# Patient Record
Sex: Female | Born: 1980 | Race: Black or African American | Hispanic: No | Marital: Single | Smoking: Former smoker
Health system: Southern US, Community
[De-identification: ages and names within clinical notes are randomized; demographics above are authoritative.]

## PROBLEM LIST (undated history)

## (undated) ENCOUNTER — Inpatient Hospital Stay (HOSPITAL_COMMUNITY): Payer: Self-pay

## (undated) DIAGNOSIS — M19072 Primary osteoarthritis, left ankle and foot: Secondary | ICD-10-CM

## (undated) DIAGNOSIS — Z6841 Body Mass Index (BMI) 40.0 and over, adult: Secondary | ICD-10-CM

## (undated) DIAGNOSIS — J302 Other seasonal allergic rhinitis: Secondary | ICD-10-CM

## (undated) DIAGNOSIS — I10 Essential (primary) hypertension: Secondary | ICD-10-CM

## (undated) DIAGNOSIS — A5901 Trichomonal vulvovaginitis: Secondary | ICD-10-CM

## (undated) DIAGNOSIS — E559 Vitamin D deficiency, unspecified: Secondary | ICD-10-CM

## (undated) DIAGNOSIS — D563 Thalassemia minor: Secondary | ICD-10-CM

## (undated) HISTORY — DX: Trichomonal vulvovaginitis: A59.01

## (undated) HISTORY — DX: Primary osteoarthritis, left ankle and foot: M19.072

## (undated) HISTORY — DX: Other seasonal allergic rhinitis: J30.2

## (undated) HISTORY — DX: Vitamin D deficiency, unspecified: E55.9

---

## 2002-05-28 ENCOUNTER — Encounter: Payer: Self-pay | Admitting: Family Medicine

## 2002-05-28 ENCOUNTER — Ambulatory Visit (HOSPITAL_COMMUNITY): Admission: RE | Admit: 2002-05-28 | Discharge: 2002-05-28 | Payer: Self-pay | Admitting: Family Medicine

## 2003-05-28 ENCOUNTER — Ambulatory Visit (HOSPITAL_COMMUNITY): Admission: RE | Admit: 2003-05-28 | Discharge: 2003-05-28 | Payer: Self-pay | Admitting: Family Medicine

## 2004-04-28 ENCOUNTER — Ambulatory Visit: Payer: Self-pay | Admitting: Family Medicine

## 2004-04-29 ENCOUNTER — Ambulatory Visit: Payer: Self-pay | Admitting: *Deleted

## 2004-05-12 ENCOUNTER — Emergency Department (HOSPITAL_COMMUNITY): Admission: EM | Admit: 2004-05-12 | Discharge: 2004-05-12 | Payer: Self-pay | Admitting: *Deleted

## 2004-06-29 ENCOUNTER — Ambulatory Visit: Payer: Self-pay | Admitting: Family Medicine

## 2004-07-20 ENCOUNTER — Ambulatory Visit: Payer: Self-pay | Admitting: Family Medicine

## 2004-10-20 ENCOUNTER — Ambulatory Visit: Payer: Self-pay | Admitting: Family Medicine

## 2005-01-14 ENCOUNTER — Ambulatory Visit: Payer: Self-pay | Admitting: Family Medicine

## 2005-06-01 ENCOUNTER — Ambulatory Visit: Payer: Self-pay | Admitting: Family Medicine

## 2005-06-15 ENCOUNTER — Ambulatory Visit: Payer: Self-pay | Admitting: Family Medicine

## 2005-06-15 ENCOUNTER — Other Ambulatory Visit: Admission: RE | Admit: 2005-06-15 | Discharge: 2005-06-15 | Payer: Self-pay | Admitting: Family Medicine

## 2005-10-07 ENCOUNTER — Ambulatory Visit: Payer: Self-pay | Admitting: Family Medicine

## 2007-12-16 ENCOUNTER — Emergency Department (HOSPITAL_COMMUNITY): Admission: EM | Admit: 2007-12-16 | Discharge: 2007-12-16 | Payer: Self-pay | Admitting: Emergency Medicine

## 2008-10-04 ENCOUNTER — Emergency Department (HOSPITAL_COMMUNITY): Admission: EM | Admit: 2008-10-04 | Discharge: 2008-10-04 | Payer: Self-pay | Admitting: Emergency Medicine

## 2009-01-23 ENCOUNTER — Emergency Department (HOSPITAL_COMMUNITY): Admission: EM | Admit: 2009-01-23 | Discharge: 2009-01-23 | Payer: Self-pay | Admitting: Emergency Medicine

## 2010-02-10 ENCOUNTER — Emergency Department (HOSPITAL_COMMUNITY): Admission: EM | Admit: 2010-02-10 | Discharge: 2010-02-10 | Payer: Self-pay | Admitting: Family Medicine

## 2010-07-31 ENCOUNTER — Emergency Department (HOSPITAL_COMMUNITY)
Admission: EM | Admit: 2010-07-31 | Discharge: 2010-07-31 | Payer: Self-pay | Source: Home / Self Care | Admitting: Family Medicine

## 2010-11-30 LAB — POCT RAPID STREP A (OFFICE): Streptococcus, Group A Screen (Direct): NEGATIVE

## 2011-02-04 ENCOUNTER — Inpatient Hospital Stay (INDEPENDENT_AMBULATORY_CARE_PROVIDER_SITE_OTHER)
Admission: RE | Admit: 2011-02-04 | Discharge: 2011-02-04 | Disposition: A | Discharge status: Home / Self Care | Payer: Self-pay | Source: Ambulatory Visit | Attending: Emergency Medicine | Admitting: Emergency Medicine

## 2011-02-04 DIAGNOSIS — J019 Acute sinusitis, unspecified: Secondary | ICD-10-CM

## 2011-05-18 LAB — POCT URINALYSIS DIP (DEVICE)
Glucose, UA: NEGATIVE
Protein, ur: >=300 — AB
Specific Gravity, Urine: 1.025
Urobilinogen, UA: 0.2
pH: 7

## 2011-09-14 ENCOUNTER — Encounter (HOSPITAL_COMMUNITY): Payer: Self-pay | Admitting: *Deleted

## 2011-09-14 ENCOUNTER — Emergency Department (INDEPENDENT_AMBULATORY_CARE_PROVIDER_SITE_OTHER)
Admission: EM | Admit: 2011-09-14 | Discharge: 2011-09-14 | Disposition: A | Discharge status: Home / Self Care | Payer: Self-pay | Source: Home / Self Care | Attending: Emergency Medicine | Admitting: Emergency Medicine

## 2011-09-14 DIAGNOSIS — J40 Bronchitis, not specified as acute or chronic: Secondary | ICD-10-CM

## 2011-09-14 MED ORDER — AZITHROMYCIN 250 MG PO TABS: 250.0000 mg | ORAL_TABLET | Freq: Every day | ORAL | Status: AC

## 2011-09-14 MED ORDER — ACETAMINOPHEN-CODEINE #3 300-30 MG PO TABS: 1.0000 | ORAL_TABLET | Freq: Four times a day (QID) | ORAL | Status: AC | PRN

## 2011-09-14 NOTE — ED Notes (Signed)
8 days of cold sxs, she feels  worse today with a productive cough, sinus congestion, left ear pain.   Denies fever/chills

## 2011-09-14 NOTE — ED Provider Notes (Signed)
History     CSN: [id number]  Arrival date & time 09/14/11  1654   First MD Initiated Contact with Patient 09/14/11 1705      Chief Complaint  Patient presents with  . URI    (Consider location/radiation/quality/duration/timing/severity/associated sxs/prior treatment) Patient is a 31 y.o. female presenting with cough. The history is provided by the patient.  Cough This is a new problem. The current episode started more than 1 week ago. The problem occurs constantly. The problem has not changed since onset.The cough is productive of sputum. There has been no fever. Associated symptoms include ear pain. Pertinent negatives include no chills, no sweats, no ear congestion, no headaches, no rhinorrhea, no sore throat, no myalgias, no shortness of breath, no wheezing and no eye redness. She has tried nothing for the symptoms. She is not a smoker. Her past medical history does not include asthma.    Past Medical History  Diagnosis Date  . Obesity     History reviewed. No pertinent past surgical history.  Family History  Problem Relation Age of Onset  . Hypertension Sister   . Hypertension Brother     History  Substance Use Topics  . Smoking status: Former Smoker    Types: Cigarettes    Quit date: 09/01/2009  . Smokeless tobacco: Not on file  . Alcohol Use: No    OB History    Grav Para Term Preterm Abortions TAB SAB Ect Mult Living                  Review of Systems  Constitutional: Negative for chills.  HENT: Positive for ear pain. Negative for sore throat and rhinorrhea.   Eyes: Negative for redness.  Respiratory: Positive for cough. Negative for shortness of breath and wheezing.   Musculoskeletal: Negative for myalgias.  Neurological: Negative for headaches.    Allergies  Penicillins  Home Medications   Current Outpatient Rx  Name Route Sig Dispense Refill  . ACETAMINOPHEN-CODEINE #3 300-30 MG PO TABS Oral Take 1-2 tablets by mouth every 6 (six) hours as  needed for pain. 15 tablet 0  . AZITHROMYCIN 250 MG PO TABS Oral Take 1 tablet (250 mg total) by mouth daily. Take first 2 tablets together, then 1 every day until finished. 6 tablet 0    BP 130/85  Pulse 64  Temp(Src) 98.6 F (37 C) (Oral)  Resp 20  SpO2 100%  LMP 09/12/2011  Physical Exam  Nursing note and vitals reviewed. Constitutional: She appears well-developed and well-nourished. She appears lethargic. No distress.  HENT:  Mouth/Throat: No oropharyngeal exudate.  Eyes: Conjunctivae are normal. Right eye exhibits no discharge. Left eye exhibits no discharge.  Neck: Neck supple. No JVD present.  Cardiovascular: Normal rate.  Exam reveals no gallop and no friction rub.   No murmur heard. Pulmonary/Chest: Effort normal. No respiratory distress. She has no decreased breath sounds. She has no wheezes. She has no rhonchi. She has no rales.  Abdominal: Soft. She exhibits no distension.  Musculoskeletal: Normal range of motion.  Lymphadenopathy:    She has no cervical adenopathy.  Neurological: She appears lethargic. No cranial nerve deficit or sensory deficit. She exhibits normal muscle tone.  Skin: Skin is warm. No erythema.    ED Course  Procedures (including critical care time)  Labs Reviewed - No data to display No results found.   1. Bronchitis       MDM  Productive cough for 1 week- Afebrile with Left side otalagia  Jimmie Molly, MD 09/14/11 [phone]

## 2011-12-16 ENCOUNTER — Encounter (HOSPITAL_COMMUNITY): Payer: Self-pay | Admitting: Emergency Medicine

## 2011-12-16 ENCOUNTER — Emergency Department (HOSPITAL_COMMUNITY)
Admission: EM | Admit: 2011-12-16 | Discharge: 2011-12-16 | Disposition: A | Discharge status: Home / Self Care | Payer: Self-pay | Source: Home / Self Care | Attending: Emergency Medicine | Admitting: Emergency Medicine

## 2011-12-16 DIAGNOSIS — J309 Allergic rhinitis, unspecified: Secondary | ICD-10-CM

## 2011-12-16 DIAGNOSIS — M7580 Other shoulder lesions, unspecified shoulder: Secondary | ICD-10-CM

## 2011-12-16 DIAGNOSIS — R030 Elevated blood-pressure reading, without diagnosis of hypertension: Secondary | ICD-10-CM

## 2011-12-16 DIAGNOSIS — J019 Acute sinusitis, unspecified: Secondary | ICD-10-CM

## 2011-12-16 DIAGNOSIS — M719 Bursopathy, unspecified: Secondary | ICD-10-CM

## 2011-12-16 MED ORDER — FEXOFENADINE-PSEUDOEPHED ER 60-120 MG PO TB12: 1.0000 | ORAL_TABLET | Freq: Two times a day (BID) | ORAL | Status: AC

## 2011-12-16 MED ORDER — BENZONATATE 200 MG PO CAPS: 200.0000 mg | ORAL_CAPSULE | Freq: Three times a day (TID) | ORAL | Status: AC | PRN

## 2011-12-16 MED ORDER — AZITHROMYCIN 250 MG PO TABS: ORAL_TABLET | ORAL | Status: AC

## 2011-12-16 MED ORDER — METHOCARBAMOL 500 MG PO TABS: 500.0000 mg | ORAL_TABLET | Freq: Three times a day (TID) | ORAL | Status: AC

## 2011-12-16 MED ORDER — FLUTICASONE PROPIONATE 50 MCG/ACT NA SUSP: 2.0000 | Freq: Every day | NASAL | Status: DC

## 2011-12-16 NOTE — Discharge Instructions (Signed)
Do exercises as demonstrated twice daily followed by ice.  Blood pressure over the ideal can put you at higher risk for stroke, heart disease, and kidney failure.  For this reason, it's important to try to get your blood pressure as close as possible to the ideal.  The ideal blood pressure is 120/80.  Blood pressures from 120-139 systolic over 80-89 diastolic are labeled as "prehypertension."  This means you are at higher risk of developing hypertension in the future.  Blood pressures in this range are not treated with medication, but lifestyle changes are recommended to prevent progression to hypertension.  Blood pressures of 140 and above systolic over 90 and above diastolic are classified as hypertension and are treated with medications.  Lifestyle changes which can benefit both prehypertension and hypertension include the following:   Salt and sodium restriction.  Weight loss.  Regular exercise.  Avoidance of tobacco.  Avoidance of excess alcohol.  The "D.A.S.H" diet.   People with hypertension and prehypertension should limit their salt intake to less than 1500 mg daily.  Reading the nutrition information on the label of many prepared foods can give you an idea of how much sodium you're consuming at each meal.  Remember that the most important number on the nutrition information is the serving size.  It may be smaller than you think.  Try to avoid adding extra salt at the table.  You may add small amounts of salt while cooking.  Remember that salt is an acquired taste and you may get used to a using a whole lot less salt than you are using now.  Using less salt lets the food's natural flavors come through.  You might want to consider using salt substitutes, potassium chloride, pepper, or blends of herbs and spices to enhance the flavor of your food.  Foods that contain the most salt include: processed meats (like ham, bacon, lunch meat, sausage, hot dogs, and breakfast meat), chips,  pretzels, salted nuts, soups, salty snacks, canned foods, junk food, fast food, restaurant food, mustard, pickles, pizza, popcorn, soy sauce, and worcestershire sauce--quite a list!  You might ask, "Is there anything I can eat?"  The answer is, "yes."  Fruits and vegetables are usually low in salt.  Fresh is better than frozen which is better than canned.  If you have canned vegetables, you can cut down on the salt content by rinsing them in tap water 3 times before cooking.     Weight loss is the second thing you can do to lower your blood pressure.  Getting to and maintaining ideal weight will often normalize your blood pressure and allow you to avoid medications, entirely, cut way down on your dosage of medications, or allow to wean off your meds.  (Note, this should only be done under the supervision of your primary care doctor.)  Of course, weight loss takes time and you may need to be on medication in the meantime.  You shoot for a body mass index of 20-25.  When you go to the urgent care or to your primary care doctor, they should calculate your BMI.  If you don't know what it is, ask.  You can calculate your BMI with the following formula:  Weight in pounds x 703/ (height in inches) x (height in inches).  There are many good diets out there: Weight Watchers and the D.A.S.H. Diet are the best, but often, just modifying a few factors can be helpful:  Don't skip meals, don't eat out,  and keeping a food diary.  I do not recommend fad diets or diet pills which often raise blood pressure.    Everyone should get regular exercise, but this is particularly important for people with high blood pressure.  Just about any exercise is good.  The only exercise which may be harmful is lifting extreme heavy weights.  I recommend moderate exercise such as walking for 30 minutes 5 days a week.  Going to the gym for a 50 minute workout 3 times a week is also good.  This amounts to 150 minutes of exercise  weekly.   Anyone with high blood pressure should avoid any use of tobacco.  Tobacco use does not elevate blood pressure, but it increases the risk of heart disease and stroke.  If you are interested in quitting, discuss with your doctor how to quit.  If you are not interested in quitting, ask yourself, "What would my life be like in 10 years if I continue to smoke?"  "How will I know when it is time to quit?"  "How would my life be better if I were to quit."   Excess alcohol intake can raise the blood pressure.  The safe alcohol intake is 2 drinks or less per day for men and 1 drink per day or less for women.   There is a very good diet which I recommend that has been designed for people with blood pressure called the D.A.S.H. Diet (dietary approaches to stop hypertension).  It consists of fruits, vegetables, lean meats, low fat dairy, whole grains, nuts and seeds.  It is very low in salt and sodium.  It has also been found to have other beneficial health effects such as lowering cholesterol and helping lose weight.  It has been developed by the [employer] and can be downloaded from the internet without any cost. Just do a Programmer, multimedia on "D.A.S.H. Diet." or go the NIH website (GolfingPosters.tn).  There are also cookbooks and diet plans that can be gotten from [place] to help you with this diet.   Allergic Rhinitis Allergic rhinitis is when the mucous membranes in the nose respond to allergens. Allergens are particles in the air that cause your body to have an allergic reaction. This causes you to release allergic antibodies. Through a chain of events, these eventually cause you to release histamine into the blood stream (hence the use of antihistamines). Although meant to be protective to the body, it is this release that causes your discomfort, such as frequent sneezing, congestion and an itchy runny nose.  CAUSES  The pollen allergens may come from grasses, trees, and weeds. This is  seasonal allergic rhinitis, or "hay fever." Other allergens cause year-round allergic rhinitis (perennial allergic rhinitis) such as house dust mite allergen, pet dander and mold spores.  SYMPTOMS   Nasal stuffiness (congestion).   Runny, itchy nose with sneezing and tearing of the eyes.   There is often an itching of the mouth, eyes and ears.  It cannot be cured, but it can be controlled with medications. DIAGNOSIS  If you are unable to determine the offending allergen, skin or blood testing may find it. TREATMENT   Avoid the allergen.   Medications and allergy shots (immunotherapy) can help.   Hay fever may often be treated with antihistamines in pill or nasal spray forms. Antihistamines block the effects of histamine. There are over-the-counter medicines that may help with nasal congestion and swelling around the eyes. Check with your  caregiver before taking or giving this medicine.  If the treatment above does not work, there are many new medications your caregiver can prescribe. Stronger medications may be used if initial measures are ineffective. Desensitizing injections can be used if medications and avoidance fails. Desensitization is when a patient is given ongoing shots until the body becomes less sensitive to the allergen. Make sure you follow up with your caregiver if problems continue. SEEK MEDICAL CARE IF:   You develop fever (more than 100.5 F (38.1 C).   You develop a cough that does not stop easily (persistent).   You have shortness of breath.   You start wheezing.   Symptoms interfere with normal daily activities.  Document Released: 05/04/2001 Document Revised: 07/29/2011 Document Reviewed: 11/13/2008 [facility] Patient Information 2012 [place], [place].Sinusitis Sinuses are air pockets within the bones of your face. The growth of bacteria within a sinus leads to infection. The infection prevents the sinuses from draining. This infection is called  sinusitis. SYMPTOMS  There will be different areas of pain depending on which sinuses have become infected.  The maxillary sinuses often produce pain beneath the eyes.   Frontal sinusitis may cause pain in the middle of the forehead and above the eyes.  Other problems (symptoms) include:  Toothaches.   Colored, pus-like (purulent) drainage from the nose.   Swelling, warmth, and tenderness over the sinus areas may be signs of infection.  TREATMENT  Sinusitis is most often determined by an exam.X-rays may be taken. If x-rays have been taken, make sure you obtain your results or find out how you are to obtain them. Your caregiver may give you medications (antibiotics). These are medications that will help kill the bacteria causing the infection. You may also be given a medication (decongestant) that helps to reduce sinus swelling.  HOME CARE INSTRUCTIONS   Only take over-the-counter or prescription medicines for pain, discomfort, or fever as directed by your caregiver.   Drink extra fluids. Fluids help thin the mucus so your sinuses can drain more easily.   Applying either moist heat or ice packs to the sinus areas may help relieve discomfort.   Use saline nasal sprays to help moisten your sinuses. The sprays can be found at your local drugstore.  SEEK IMMEDIATE MEDICAL CARE IF:  You have a fever.   You have increasing pain, severe headaches, or toothache.   You have nausea, vomiting, or drowsiness.   You develop unusual swelling around the face or trouble seeing.  MAKE SURE YOU:   Understand these instructions.   Will watch your condition.   Will get help right away if you are not doing well or get worse.  Document Released: 08/09/2005 Document Revised: 07/29/2011 Document Reviewed: 03/08/2007 [facility] Patient Information 2012 [place], [place].  Get blood pressure checked in 2 weeks.

## 2011-12-16 NOTE — ED Notes (Signed)
Pt here with sinus sx post nasal drip,sore throat that started Monday unrelieved by otc meds and also right neck/shoulder pain from poss strain.

## 2011-12-16 NOTE — ED Provider Notes (Signed)
Chief Complaint  Patient presents with  . Sinusitis  . Shoulder Pain    History of Present Illness:   The patient is a 31 year old female who has had a several year long history of allergies. They have flared up for the past month. She notes nasal congestion, rhinorrhea with yellow drainage, sneezing, nasal itching, headache, and facial pain. She also has postnasal drip, sore throat, hoarseness, and cough productive of yellow sputum. She tried Benadryl which did help a little bit. She denies any itchy, watery, red eyes.  She also has had a four-day history of pain in her upper back overlying the right shoulder blade. This is worsened if she moves her shoulder or takes a deep breath. She attributes this to coughing hard. There is no radiation of the pain down the arm, no numbness, tingling, or weakness. No shortness of breath or hemoptysis.  Review of Systems:  Other than noted above, the patient denies any of the following symptoms. Systemic:  No fever, chills, sweats, fatigue, myalgias, headache, or anorexia. Eye:  No redness, itching, watering, pain or drainage. ENT:  No earache, ear congestion, nasal congestion, sneezing, nasal itching, rhinorrhea, sinus pressure, sinus pain, post nasal drip, or sore throat. Lungs:  No cough, sputum production, wheezing, shortness of breath, or chest pain. Skin:  No rash or itching.  PMFSH:  Past medical history, family history, social history, meds, and allergies were reviewed.  No history of asthma. No use of tobacco.  Physical Exam:   Vital signs:  BP 141/102  Pulse 72  Temp(Src) 98.4 F (36.9 C) (Oral)  Resp 18  SpO2 100%  LMP 12/03/2011 General:  Alert, in no distress. Eye:  No conjunctival injection or drainage. Lids were normal. ENT:  TMs and canals were normal, without erythema or inflammation.  Nasal mucosa was congested, pale and boggy with clear drainage.  Mucous membranes were moist.  Pharynx was clear, without exudate or drainage.  There  were no oral ulcerations or lesions. Neck:  Supple, no adenopathy, tenderness or mass. Lungs:  No respiratory distress.  Lungs were clear to auscultation, without wheezes, rales or rhonchi.  Breath sounds were clear and equal bilaterally. Heart:  Regular rhythm, without gallops, murmers or rubs. Extremities: There is pain to palpation over the right shoulder blade and medial to the shoulder blade as well. The shoulder has a full range of motion but with some pain on flexion and abduction. Impingement signs are positive. Skin:  Clear, warm, and dry, without rash or lesions.  Assessment:  The primary encounter diagnosis was Allergic rhinitis. Diagnoses of Acute sinusitis, Rotator cuff tendonitis, and Elevated blood pressure were also pertinent to this visit.  Plan:   1.  The following meds were prescribed:   New Prescriptions   AZITHROMYCIN (ZITHROMAX Z-PAK) 250 MG TABLET    Take as directed.   BENZONATATE (TESSALON) 200 MG CAPSULE    Take 1 capsule (200 mg total) by mouth 3 (three) times daily as needed for cough.   FEXOFENADINE-PSEUDOEPHEDRINE (ALLEGRA-D) 60-120 MG PER TABLET    Take 1 tablet by mouth every 12 (twelve) hours.   FLUTICASONE (FLONASE) 50 MCG/ACT NASAL SPRAY    Place 2 sprays into the nose daily.   METHOCARBAMOL (ROBAXIN) 500 MG TABLET    Take 1 tablet (500 mg total) by mouth 3 (three) times daily.   2.  The patient was instructed in symptomatic care and handouts were given. 3.  The patient was told to return if becoming worse in  any way, if no better in 3 or 4 days, and given some red flag symptoms that would indicate earlier return.  Follow up:  The patient was told to follow up with her primary care physician if no better in a week. She was instructed in some shoulder exercises and should followup if the shoulder isn't better as well. She was also told to followup about her blood pressure which was elevated today.    Reuben Likes, MD 12/16/11 2139

## 2012-06-29 ENCOUNTER — Encounter (HOSPITAL_COMMUNITY): Payer: Self-pay | Admitting: *Deleted

## 2012-06-29 ENCOUNTER — Emergency Department (INDEPENDENT_AMBULATORY_CARE_PROVIDER_SITE_OTHER)
Admission: EM | Admit: 2012-06-29 | Discharge: 2012-06-29 | Disposition: A | Discharge status: Home / Self Care | Payer: Self-pay | Source: Home / Self Care | Attending: Family Medicine | Admitting: Family Medicine

## 2012-06-29 DIAGNOSIS — J069 Acute upper respiratory infection, unspecified: Secondary | ICD-10-CM

## 2012-06-29 MED ORDER — GUAIFENESIN-CODEINE 100-10 MG/5ML PO SYRP: 10.0000 mL | ORAL_SOLUTION | Freq: Three times a day (TID) | ORAL | Status: DC | PRN

## 2012-06-29 MED ORDER — AZITHROMYCIN 250 MG PO TABS: ORAL_TABLET | ORAL | Status: DC

## 2012-06-29 MED ORDER — IPRATROPIUM BROMIDE 0.06 % NA SOLN: 2.0000 | Freq: Four times a day (QID) | NASAL | Status: DC

## 2012-06-29 NOTE — ED Notes (Signed)
C/o cough onset Sun.  occ. prod. of yellowish sputum.  No chills or fever, or runny nose.  Had a little pain in her L ear yesterday.  Also c/o ant and post. chest pain with a deep breath. "Feels like someone is sitting on my back."

## 2012-06-29 NOTE — ED Provider Notes (Signed)
History     CSN: [id number]  Arrival date & time 06/29/12  1646   First MD Initiated Contact with Patient 06/29/12 1702      Chief Complaint  Patient presents with  . Cough    (Consider location/radiation/quality/duration/timing/severity/associated sxs/prior treatment) Patient is a 31 y.o. female presenting with cough. The history is provided by the patient.  Cough This is a new problem. The current episode started more than 2 days ago. The problem has been gradually worsening. The cough is productive of sputum. There has been no fever. Associated symptoms include chest pain. Pertinent negatives include no rhinorrhea, no shortness of breath and no wheezing.    Past Medical History  Diagnosis Date  . Obesity     History reviewed. No pertinent past surgical history.  Family History  Problem Relation Age of Onset  . Hypertension Sister   . Hypertension Brother     History  Substance Use Topics  . Smoking status: Former Smoker    Types: Cigarettes    Quit date: 09/01/2009  . Smokeless tobacco: Not on file  . Alcohol Use: No    OB History    Grav Para Term Preterm Abortions TAB SAB Ect Mult Living                  Review of Systems  Constitutional: Negative.   HENT: Negative for congestion, rhinorrhea and postnasal drip.   Respiratory: Positive for cough. Negative for shortness of breath and wheezing.   Cardiovascular: Positive for chest pain.  Gastrointestinal: Negative.     Allergies  Penicillins  Home Medications   Current Outpatient Rx  Name  Route  Sig  Dispense  Refill  . AZITHROMYCIN 250 MG PO TABS      Take as directed on pack   6 each   0   . FEXOFENADINE-PSEUDOEPHED ER 60-120 MG PO TB12   Oral   Take 1 tablet by mouth every 12 (twelve) hours.   30 tablet   0   . FLUTICASONE PROPIONATE 50 MCG/ACT NA SUSP   Nasal   Place 2 sprays into the nose daily.   16 g   0   . GUAIFENESIN-CODEINE 100-10 MG/5ML PO SYRP   Oral   Take 10 mLs by  mouth 3 (three) times daily as needed for cough.   180 mL   0   . IPRATROPIUM BROMIDE 0.06 % NA SOLN   Nasal   Place 2 sprays into the nose 4 (four) times daily.   15 mL   12     BP 171/101  Pulse 69  Temp 98.1 F (36.7 C) (Oral)  Resp 18  SpO2 100%  LMP 06/19/2012  Physical Exam  Nursing note and vitals reviewed. Constitutional: She is oriented to person, place, and time. She appears well-developed and well-nourished.  HENT:  Head: Normocephalic.  Right Ear: External ear normal.  Left Ear: External ear normal.  Mouth/Throat: Oropharynx is clear and moist.  Eyes: Conjunctivae normal are normal. Pupils are equal, round, and reactive to light.  Neck: Normal range of motion. Neck supple.  Cardiovascular: Normal rate, regular rhythm, normal heart sounds and intact distal pulses.   Pulmonary/Chest: Effort normal and breath sounds normal. She exhibits tenderness.  Lymphadenopathy:    She has no cervical adenopathy.  Neurological: She is alert and oriented to person, place, and time.  Skin: Skin is warm and dry.  Psychiatric: She has a normal mood and affect.    ED Course  Procedures (including critical care time)  Labs Reviewed - No data to display No results found.   1. URI (upper respiratory infection)       MDM          Linna Hoff, MD 06/29/12 [phone]

## 2012-09-21 ENCOUNTER — Emergency Department (INDEPENDENT_AMBULATORY_CARE_PROVIDER_SITE_OTHER)
Admission: EM | Admit: 2012-09-21 | Discharge: 2012-09-21 | Disposition: A | Discharge status: Home / Self Care | Payer: PRIVATE HEALTH INSURANCE | Source: Home / Self Care | Attending: Emergency Medicine | Admitting: Emergency Medicine

## 2012-09-21 ENCOUNTER — Encounter (HOSPITAL_COMMUNITY): Payer: Self-pay | Admitting: *Deleted

## 2012-09-21 DIAGNOSIS — J040 Acute laryngitis: Secondary | ICD-10-CM

## 2012-09-21 DIAGNOSIS — H6690 Otitis media, unspecified, unspecified ear: Secondary | ICD-10-CM

## 2012-09-21 DIAGNOSIS — J069 Acute upper respiratory infection, unspecified: Secondary | ICD-10-CM

## 2012-09-21 DIAGNOSIS — H6691 Otitis media, unspecified, right ear: Secondary | ICD-10-CM

## 2012-09-21 MED ORDER — CEFUROXIME AXETIL 500 MG PO TABS: 500.0000 mg | ORAL_TABLET | Freq: Two times a day (BID) | ORAL | Status: DC

## 2012-09-21 MED ORDER — ANTIPYRINE-BENZOCAINE 5.4-1.4 % OT SOLN: 3.0000 [drp] | OTIC | Status: DC | PRN

## 2012-09-21 NOTE — ED Provider Notes (Signed)
Medical screening examination/treatment/procedure(s) were performed by non-physician practitioner and as supervising physician I was immediately available for consultation/collaboration.  Leslee Home, M.D.   Reuben Likes, MD 09/21/12 [phone]

## 2012-09-21 NOTE — ED Notes (Addendum)
C/o cough onset 1 week ago, with headache, R ear popping, blood tinged nasal congestion and cough.  No chills or fever or sore throat.  Laryngitis onset this AM.  Cough is prod. of yellow sputum.

## 2012-09-21 NOTE — ED Provider Notes (Signed)
History     CSN: [id number]  Arrival date & time 09/21/12  1626   First MD Initiated Contact with Patient 09/21/12 1852      Chief Complaint  Patient presents with  . Otalgia    (Consider location/radiation/quality/duration/timing/severity/associated sxs/prior treatment) HPI Comments: Pt with cold sx for a week. In last couple of days has developed R ear pain which is her most concerning sx. Lost her voice today.   Patient is a 32 y.o. female presenting with ear pain. The history is provided by the patient.  Otalgia This is a new problem. The current episode started 2 days ago. There is pain in the right ear. The problem occurs constantly. The problem has been gradually worsening. There has been no fever. The pain is at a severity of 6/10. Associated symptoms include headaches, rhinorrhea, sore throat and cough.    Past Medical History  Diagnosis Date  . Obesity     History reviewed. No pertinent past surgical history.  Family History  Problem Relation Age of Onset  . Hypertension Sister   . Hypertension Brother   . Diabetes Mother   . Heart failure Father     History  Substance Use Topics  . Smoking status: Former Smoker    Types: Cigarettes    Quit date: 09/01/2009  . Smokeless tobacco: Not on file  . Alcohol Use: No    OB History    Grav Para Term Preterm Abortions TAB SAB Ect Mult Living                  Review of Systems  Constitutional: Negative for fever and chills.  HENT: Positive for ear pain, congestion, sore throat, rhinorrhea, voice change, postnasal drip and sinus pressure. Negative for trouble swallowing.   Respiratory: Positive for cough.   Cardiovascular: Negative for chest pain.  Neurological: Positive for headaches.    Allergies  Penicillins  Home Medications   Current Outpatient Rx  Name  Route  Sig  Dispense  Refill  . IPRATROPIUM BROMIDE 0.06 % NA SOLN   Nasal   Place 2 sprays into the nose 4 (four) times daily.   15 mL    12   . ANTIPYRINE-BENZOCAINE 5.4-1.4 % OT SOLN   Right Ear   Place 3 drops into the right ear every 2 (two) hours as needed for pain.   10 mL   0   . AZITHROMYCIN 250 MG PO TABS      Take as directed on pack   6 each   0   . CEFUROXIME AXETIL 500 MG PO TABS   Oral   Take 1 tablet (500 mg total) by mouth 2 (two) times daily.   20 tablet   0   . FEXOFENADINE-PSEUDOEPHED ER 60-120 MG PO TB12   Oral   Take 1 tablet by mouth every 12 (twelve) hours.   30 tablet   0   . FLUTICASONE PROPIONATE 50 MCG/ACT NA SUSP   Nasal   Place 2 sprays into the nose daily.   16 g   0   . GUAIFENESIN-CODEINE 100-10 MG/5ML PO SYRP   Oral   Take 10 mLs by mouth 3 (three) times daily as needed for cough.   180 mL   0     BP 126/84  Pulse 78  Temp 98.4 F (36.9 C) (Oral)  Resp 16  SpO2 100%  LMP 09/06/2012  Physical Exam  Constitutional: She appears well-developed and well-nourished. No distress.  HENT:  Right Ear: External ear and ear canal normal. Tympanic membrane is erythematous and bulging.  Left Ear: Tympanic membrane, external ear and ear canal normal.  Nose: Mucosal edema present. Right sinus exhibits maxillary sinus tenderness and frontal sinus tenderness. Left sinus exhibits maxillary sinus tenderness and frontal sinus tenderness.  Mouth/Throat: Oropharynx is clear and moist.  Cardiovascular: Normal rate and regular rhythm.   Pulmonary/Chest: Effort normal and breath sounds normal.  Lymphadenopathy:       Head (right side): No submandibular, no tonsillar, no preauricular and no posterior auricular adenopathy present.       Head (left side): No submandibular, no tonsillar, no preauricular and no posterior auricular adenopathy present.    ED Course  Procedures (including critical care time)  Labs Reviewed - No data to display No results found.   1. Otitis media of right ear   2. URI (upper respiratory infection)   3. Laryngitis       MDM           Cathlyn Parsons, NP 09/21/12 1914

## 2013-01-25 ENCOUNTER — Emergency Department (INDEPENDENT_AMBULATORY_CARE_PROVIDER_SITE_OTHER)
Admission: EM | Admit: 2013-01-25 | Discharge: 2013-01-25 | Disposition: A | Discharge status: Home / Self Care | Payer: 59 | Source: Home / Self Care | Attending: Emergency Medicine | Admitting: Emergency Medicine

## 2013-01-25 ENCOUNTER — Encounter (HOSPITAL_COMMUNITY): Payer: Self-pay | Admitting: *Deleted

## 2013-01-25 DIAGNOSIS — J069 Acute upper respiratory infection, unspecified: Secondary | ICD-10-CM

## 2013-01-25 DIAGNOSIS — J040 Acute laryngitis: Secondary | ICD-10-CM

## 2013-01-25 DIAGNOSIS — J039 Acute tonsillitis, unspecified: Secondary | ICD-10-CM

## 2013-01-25 DIAGNOSIS — H6690 Otitis media, unspecified, unspecified ear: Secondary | ICD-10-CM

## 2013-01-25 DIAGNOSIS — J329 Chronic sinusitis, unspecified: Secondary | ICD-10-CM

## 2013-01-25 MED ORDER — HYDROCODONE-ACETAMINOPHEN 5-325 MG PO TABS: ORAL_TABLET | ORAL | Status: DC

## 2013-01-25 MED ORDER — HYDROCODONE-ACETAMINOPHEN 5-325 MG PO TABS
ORAL_TABLET | ORAL | Status: AC
  Filled 2013-01-25: qty 1

## 2013-01-25 MED ORDER — DOXYCYCLINE HYCLATE 100 MG PO TABS: 100.0000 mg | ORAL_TABLET | Freq: Two times a day (BID) | ORAL | Status: DC

## 2013-01-25 MED ORDER — IBUPROFEN 800 MG PO TABS
ORAL_TABLET | ORAL | Status: AC
  Filled 2013-01-25: qty 1

## 2013-01-25 MED ORDER — HYDROCODONE-ACETAMINOPHEN 5-325 MG PO TABS
1.0000 | ORAL_TABLET | Freq: Once | ORAL | Status: AC
  Administered 2013-01-25: 1 via ORAL

## 2013-01-25 MED ORDER — IBUPROFEN 800 MG PO TABS
800.0000 mg | ORAL_TABLET | Freq: Once | ORAL | Status: AC
  Administered 2013-01-25: 800 mg via ORAL

## 2013-01-25 MED ORDER — FLUTICASONE PROPIONATE 50 MCG/ACT NA SUSP: 2.0000 | Freq: Every day | NASAL | Status: DC

## 2013-01-25 NOTE — ED Provider Notes (Addendum)
Chief Complaint:   Chief Complaint  Patient presents with  . Facial Pain    History of Present Illness:   Marissa Lowe is a 32 year old female who presents with a three-day history of sinus pressure, headache, right ear pressure, postnasal drip, sore throat, hoarseness, cough productive yellow sputum, chest tightness, has felt hot and cold, chills, and dizzy, had nasal congestion with clear to bloody drainage. She rates her pain as 7-8/10 in intensity. 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 She tried Motrin for the pain without any improvement.  Review of Systems:  Other than noted above, the patient denies any of the following symptoms: Systemic:  No fevers, chills, sweats, weight loss or gain, fatigue, or tiredness. Eye:  No redness or discharge. ENT:  No ear pain, drainage, headache, nasal congestion, drainage, sinus pressure, difficulty swallowing, or sore throat. Neck:  No neck pain or swollen glands. Lungs:  No cough, sputum production, hemoptysis, wheezing, chest tightness, shortness of breath or chest pain. GI:  No abdominal pain, nausea, vomiting or diarrhea.  PMFSH:  Past medical history, family history, social history, meds, and allergies were reviewed.   Physical Exam:   Vital signs:  BP 153/86  Pulse 77  Temp(Src) 98.1 F (36.7 C) (Oral)  Resp 16  SpO2 100%  LMP 01/24/2013 General:  Alert and oriented.  In no distress.  Skin warm and dry. Eye:  No conjunctival injection or drainage. Lids were normal. ENT:  TMs and canals were normal, without erythema or inflammation.  Nasal mucosa was clear and uncongested, without drainage.  Mucous membranes were moist.  Pharynx was clear with no exudate or drainage.  There were no oral ulcerations or lesions. Neck:  Supple, no adenopathy, tenderness or mass. Lungs:  No respiratory distress.  Lungs were clear to auscultation, without wheezes, rales or rhonchi.  Breath sounds were clear and equal bilaterally.   Heart:  Regular rhythm, without gallops, murmers or rubs. Skin:  Clear, warm, and dry, without rash or lesions.  Labs:   Results for orders placed during the hospital encounter of 01/25/13  POCT RAPID STREP A ([facility] ONLY)      Result Value Range   Streptococcus, Group A Screen (Direct) NEGATIVE  NEGATIVE    Course in Urgent Care Center:   For pain she was given ibuprofen 800 mg and Norco 5/325 by mouth.  Assessment:  The primary encounter diagnosis was Sinusitis. A diagnosis of Tonsillitis was also pertinent to this visit.  No evidence of strep, no evidence of a peritonsillar abscess. Since she is allergic to penicillins, will treat with doxycycline and given hydrocodone for the pain.  Plan:   1.  The following meds were prescribed:   Discharge Medication List as of 01/25/2013  6:16 PM    START taking these medications   Details  doxycycline (VIBRA-TABS) 100 MG tablet Take 1 tablet (100 mg total) by mouth 2 (two) times daily., Starting 01/25/2013, Until Discontinued, Normal    HYDROcodone-acetaminophen (NORCO/VICODIN) 5-325 MG per tablet 1 to 2 tabs every 4 to 6 hours as needed for pain., Print       2.  The patient was instructed in symptomatic care and handouts were given. 3.  The patient was told to return if becoming worse in any way, if no better in 3 or 4 days, and given some red flag symptoms such as fever or difficulty swallowing or breathing that would indicate earlier return. 4.  Follow up  here if worse in any way or no better in 3-4 days.      Reuben Likes, MD 01/25/13 2208  Addendum: Throat culture shows beta hemolytic strep, not group A. Will treat with azithromycin 500 mg daily for 5 days and have her stop the doxycycline.  Reuben Likes, MD 02/01/13 1636  Reuben Likes, MD 02/01/13 [phone]

## 2013-01-25 NOTE — ED Notes (Signed)
Describes frontal and maxillary sinus pressure since Tuesday with post-nasal drainage, some dizziness, and some bloody nasal discharge.  Unk if fevers, but reports chills.  Has been taking Motrin with no relief.

## 2013-01-27 LAB — CULTURE, GROUP A STREP

## 2013-01-28 NOTE — ED Notes (Signed)
Chart review.

## 2013-02-01 MED ORDER — AZITHROMYCIN 500 MG PO TABS: 500.0000 mg | ORAL_TABLET | Freq: Every day | ORAL | Status: DC

## 2013-02-08 ENCOUNTER — Telehealth (HOSPITAL_COMMUNITY): Payer: Self-pay | Admitting: *Deleted

## 2013-02-08 NOTE — ED Notes (Signed)
Throat culture: Strep- not group A.  6/12  Order for Zithromax e- prescribed by Dr. Lorenz Coaster to the [facility] on [address].  6/19 I called pt. and left a message to call. Pt. called back.  Pt. verified x 2 and given results.  Pt. said she finished all of the other antibioitc ( Doxycycline).  She said the pharmacy called her several times to pick up the other antibiotic but was not going to get it unless told to take it.  I explained that the Doxcycyline was not the best antibiotic for what she has and that is why Dr. Lorenz Coaster changed it.  I asked if she still had the sore throat.  She said no but still has a headache and sinus drainage. I told her she she go ahead and take the Zithromax, since she is still having symptoms. Pt. voiced understanding. Marissa Lowe 02/08/2013

## 2013-03-01 ENCOUNTER — Other Ambulatory Visit (HOSPITAL_COMMUNITY)
Admission: RE | Admit: 2013-03-01 | Discharge: 2013-03-01 | Disposition: A | Discharge status: Home / Self Care | Payer: 59 | Source: Ambulatory Visit | Attending: Family Medicine | Admitting: Family Medicine

## 2013-03-01 ENCOUNTER — Ambulatory Visit (INDEPENDENT_AMBULATORY_CARE_PROVIDER_SITE_OTHER): Payer: PRIVATE HEALTH INSURANCE | Admitting: Family Medicine

## 2013-03-01 ENCOUNTER — Encounter: Payer: Self-pay | Admitting: Family Medicine

## 2013-03-01 ENCOUNTER — Other Ambulatory Visit: Payer: Self-pay | Admitting: Family Medicine

## 2013-03-01 VITALS — BP 122/78 | HR 72 | Ht 65.0 in | Wt 363.0 lb

## 2013-03-01 DIAGNOSIS — Z13220 Encounter for screening for lipoid disorders: Secondary | ICD-10-CM

## 2013-03-01 DIAGNOSIS — Z6841 Body Mass Index (BMI) 40.0 and over, adult: Secondary | ICD-10-CM

## 2013-03-01 DIAGNOSIS — R51 Headache: Secondary | ICD-10-CM

## 2013-03-01 DIAGNOSIS — N920 Excessive and frequent menstruation with regular cycle: Secondary | ICD-10-CM

## 2013-03-01 DIAGNOSIS — Z23 Encounter for immunization: Secondary | ICD-10-CM

## 2013-03-01 DIAGNOSIS — R5381 Other malaise: Secondary | ICD-10-CM

## 2013-03-01 DIAGNOSIS — N760 Acute vaginitis: Secondary | ICD-10-CM

## 2013-03-01 DIAGNOSIS — Z01419 Encounter for gynecological examination (general) (routine) without abnormal findings: Secondary | ICD-10-CM | POA: Insufficient documentation

## 2013-03-01 DIAGNOSIS — Z1151 Encounter for screening for human papillomavirus (HPV): Secondary | ICD-10-CM | POA: Insufficient documentation

## 2013-03-01 DIAGNOSIS — Z113 Encounter for screening for infections with a predominantly sexual mode of transmission: Secondary | ICD-10-CM | POA: Insufficient documentation

## 2013-03-01 DIAGNOSIS — R5383 Other fatigue: Secondary | ICD-10-CM

## 2013-03-01 DIAGNOSIS — Z0000 Encounter for general adult medical examination without abnormal findings: Secondary | ICD-10-CM

## 2013-03-01 DIAGNOSIS — J309 Allergic rhinitis, unspecified: Secondary | ICD-10-CM

## 2013-03-01 LAB — CBC WITH DIFFERENTIAL/PLATELET
Lymphocytes Relative: 38 % (ref 12–46)
Lymphs Abs: 2.8 10*3/uL (ref 0.7–4.0)
Neutrophils Relative %: 54 % (ref 43–77)
Platelets: 323 10*3/uL (ref 150–400)
RBC: 5.04 MIL/uL (ref 3.87–5.11)
WBC: 7.4 10*3/uL (ref 4.0–10.5)

## 2013-03-01 LAB — COMPREHENSIVE METABOLIC PANEL
Albumin: 4.2 g/dL (ref 3.5–5.2)
CO2: 28 mEq/L (ref 19–32)
Glucose, Bld: 87 mg/dL (ref 70–99)
Potassium: 4.1 mEq/L (ref 3.5–5.3)
Sodium: 140 mEq/L (ref 135–145)
Total Protein: 6.6 g/dL (ref 6.0–8.3)

## 2013-03-01 LAB — POCT URINALYSIS DIPSTICK
Bilirubin, UA: NEGATIVE
Leukocytes, UA: NEGATIVE
Nitrite, UA: NEGATIVE
Protein, UA: NEGATIVE
pH, UA: 5

## 2013-03-01 LAB — HIV ANTIBODY (ROUTINE TESTING W REFLEX): HIV: NONREACTIVE

## 2013-03-01 LAB — LIPID PANEL: HDL: 43 mg/dL (ref >39)

## 2013-03-01 NOTE — Progress Notes (Signed)
Chief Complaint  Patient presents with  . Annual Exam    new patient non fasting cpe with pap. Pt complains of having HA's all the time, thinks it may be from either sinuses or strees related.    Marissa Lowe is a 32 y.o. female who presents for a complete physical.  She has the following concerns:  She complains of headaches--some are frontal, between eyes, and others are temporal.  She does have some congestion and postnasal drainage, occasional cough.  She has no other complaints.  Thinks she might snore.  Not aware of apnea.  Denies daytime somnolence, feels refreshed in mornings.  Health Maintenance: Immunization History  Administered Date(s) Administered  . Tdap 03/01/2013   Can't recall last Tetanus, likely >10 years. Had TB testing last year.  Gets flu shots yearly at work. Recalls having Hepatitis B series. Last Pap smear: 2 years ago; no h/o abnormal paps Last mammogram: never Last colonoscopy: never Last DEXA: never Dentist: twice yearly Ophtho: never Exercise:  Walks 30 minutes after work, plus is on her feet all day at work (constantly moving)  Past Medical History  Diagnosis Date  . Obesity   . Seasonal allergies     History reviewed. No pertinent past surgical history.  History   Social History  . Marital Status: Single    Spouse Name: N/A    Number of Children: N/A  . Years of Education: N/A   Occupational History  . works in Pharmacologist room at [employer] History Main Topics  . Smoking status: Former Smoker    Types: Cigarettes    Quit date: 09/01/2009  . Smokeless tobacco: Not on file  . Alcohol Use: No  . Drug Use: No  . Sexually Active: Yes -- Female partner(s)    Birth Control/ Protection: Condom   Other Topics Concern  . Not on file   Social History Narrative   Lives with her sister, 1 dog.  No tobacco exposure    Family History  Problem Relation Age of Onset  . Hypertension Sister   . Hypertension Brother   . Diabetes  Mother   . Heart failure Father   . Hypertension Father   . Cancer Neg Hx   . Stroke Neg Hx     Current outpatient prescriptions:ibuprofen (ADVIL,MOTRIN) 200 MG tablet, Take 400 mg by mouth every 6 (six) hours as needed for pain., Disp: , Rfl: ;  fluticasone (FLONASE) 50 MCG/ACT nasal spray, Place 2 sprays into the nose daily., Disp: 16 g, Rfl: 0 She hasn't been using her Flonase recently.  Allergies  Allergen Reactions  . Penicillins Hives    ROS:  The patient denies anorexia, fever, weight changes, vision changes, decreased hearing, ear pain, sore throat, breast concerns, chest pain, palpitations, lightheadedness, syncope, dyspnea on exertion, cough, swelling, nausea, vomiting, diarrhea, constipation, abdominal pain, melena, hematochezia, indigestion/heartburn, hematuria, incontinence, dysuria, irregular menstrual cycles, vaginal discharge, odor or itch, genital lesions, joint pains, numbness, tingling, weakness, tremor, suspicious skin lesions, depression, anxiety, abnormal bleeding/bruising, or enlarged lymph nodes. +frontal headaches, worse at work in the heat; doesn't get headaches when not working, only starts just prior to going to work.  +mild congestion, postnasal drainage and some cough.  Mucus is clear, sometimes slightly yellow.  Some mild off-balance feeling/vertigo associated with the headaches. +fatigue x 2 weeks. occasional sharp pain in her feet at the arches.   +work stress.  Periods are heavy, cramping x 3 days.  Regular every month.  PHYSICAL  EXAM: BP 122/78  Pulse 72  Ht 5\' 5"  (1.651 m)  Wt 363 lb (164.656 kg)  BMI 60.41 kg/m2  LMP 02/22/2013 122/78 on repeat by MD, RA General Appearance:    Alert, cooperative, no distress, appears stated age, morbidly obese  Head:    Normocephalic, without obvious abnormality, atraumatic. She has tenderness at bilateral temporalis muscles, and mild bilateral frontal sinus tenderness.  No temporal artery tenderness  Eyes:     PERRL, conjunctiva/corneas clear, EOM's intact, fundi    benign  Ears:    Normal TM's and external ear canals  Nose:   Nares normal, mucosa is moderately edematous, no purulence or sinus tenderness  Throat:   Lips and tongue normal; teeth and gums normal. Spotty hyperpigmentation on hard and soft palate  Neck:   Supple, no lymphadenopathy;  thyroid:  no   enlargement/tenderness/nodules; no carotid   bruit or JVD  Back:    Spine nontender, no curvature, ROM normal, no CVA     tenderness  Lungs:     Clear to auscultation bilaterally without wheezes, rales or ronchi; respirations unlabored  Chest Wall:    No tenderness or deformity   Heart:    Regular rate and rhythm, S1 and S2 normal, no murmur, rub   or gallop  Breast Exam:    No tenderness, masses, or nipple discharge or inversion.      No axillary lymphadenopathy  Abdomen:     Soft, non-tender, nondistended, normoactive bowel sounds,  no masses, no hepatosplenomegaly.  obese  Genitalia:    Normal external genitalia without lesions.  BUS and vagina normal; cervix without lesions, or cervical motion tenderness. No abnormal vaginal discharge.  Uterus and adnexa nontender, no masses could be appreciated, but exam was limited by her body habitus.  Pap performed  Rectal:    Not performed due to age<40 and no related complaints  Extremities:   No clubbing, cyanosis or edema  Pulses:   2+ and symmetric all extremities  Skin:   Skin color, texture, turgor normal, no rashes or lesions.  +tatoos  Lymph nodes:   Cervical, supraclavicular, and axillary nodes normal  Neurologic:   CNII-XII intact, normal strength, sensation and gait; reflexes 2+ and symmetric throughout          Psych:   Normal mood, affect, hygiene and grooming.     ASSESSMENT/PLAN:  Routine general medical examination at a health care facility - Plan: Visual acuity screening, POCT Urinalysis Dipstick, Lipid panel, HIV antibody, Cytology - PAP [place]  Need for Tdap vaccination -  Plan: Tdap vaccine greater than or equal to 7yo IM  Other malaise and fatigue - Plan: Comprehensive metabolic panel, CBC with Differential, Vitamin D 25 hydroxy, TSH  Heavy menses - Plan: CBC with Differential, TSH  Screening for lipoid disorders - Plan: Lipid panel  Vaginitis and vulvovaginitis, unspecified - Plan: Cytology - PAP [place]  Morbid obesity with BMI of 60.0-69.9, adult  Allergic rhinitis, cause unspecified - restart Flonase  Headache(784.0) - sinus and muscular.  restart Flonase.  NSAID's prn headache. see dentist if grinding teeth.  avoid clenching jaw/teeth.  follow up if HA's persist  Tension headaches/muscular headaches--ibuprofen or naproxen as needed, warm compresses.  Ensure not grinding teeth or clenching jaws, if so, discuss with dentist.  Sinus headaches--restart Flonase, sinuses rinses  Discussed monthly self breast exams and yearly mammograms after the age of 20; at least 30 minutes of aerobic activity at least 5 days/week; proper sunscreen use reviewed;  healthy diet, including goals of calcium and vitamin D intake and alcohol recommendations (less than or equal to 1 drink/day) reviewed; regular seatbelt use; changing batteries in smoke detectors.  Immunization recommendations discussed--TdaP given, flu shots recommended in fall.  Colonoscopy recommendations reviewed--age 40.  Fatigue--possibly related to allergies or anemia (h/o anemia in past due to heavy periods).  Check labs.  If iron deficient, will recommend replacement.  (Briefly discussed OCP's to lighten periods, but given elevated BP would use low dose and need f/u on BP after starting. BP was normal on repeat, so would be okay if needed)  Obesity--Cut back on portion sizes, do not drink regular sodas, healthy snacks, and increasing exercise was reviewed in effort to help with weight loss.  Risks of obesity were reviewed.  To ask family regarding sleep apnea--call for referral if +suspected

## 2013-03-01 NOTE — Patient Instructions (Addendum)
HEALTH MAINTENANCE RECOMMENDATIONS:  It is recommended that you get at least 30 minutes of aerobic exercise at least 5 days/week (for weight loss, you may need as much as 60-90 minutes). This can be any activity that gets your heart rate up. This can be divided in 10-15 minute intervals if needed, but try and build up your endurance at least once a week.  Weight bearing exercise is also recommended twice weekly.  Eat a healthy diet with lots of vegetables, fruits and fiber.  "Colorful" foods have a lot of vitamins (ie green vegetables, tomatoes, red peppers, etc).  Limit sweet tea, regular sodas and alcoholic beverages, all of which has a lot of calories and sugar.  Up to 1 alcoholic drink daily may be beneficial for women (unless trying to lose weight, watch sugars).  Drink a lot of water.  Calcium recommendations are 1200-1500 mg daily (1500 mg for postmenopausal women or women without ovaries), and vitamin D 1000 IU daily.  This should be obtained from diet and/or supplements (vitamins), and calcium should not be taken all at once, but in divided doses.  Monthly self breast exams and yearly mammograms for women over the age of 71 is recommended.  Sunscreen of at least SPF 30 should be used on all sun-exposed parts of the skin when outside between the hours of 10 am and 4 pm (not just when at beach or pool, but even with exercise, golf, tennis, and yard work!)  Use a sunscreen that says "broad spectrum" so it covers both UVA and UVB rays, and make sure to reapply every 1-2 hours.  Remember to change the batteries in your smoke detectors when changing your clock times in the spring and fall.  Use your seat belt every time you are in a car, and please drive safely and not be distracted with cell phones and texting while driving.  Tension headaches/muscular headaches--ibuprofen or naproxen as needed, warm compresses.  Ensure not grinding teeth or clenching jaws, if so, discuss with dentist.  Sinus  headaches--restart Flonase, sinuses rinses  Ask your sister and boyfriend about loud snoring, and if you stop breathing while sleeping. Also ask about grinding your teeth at night.

## 2013-03-02 ENCOUNTER — Encounter: Payer: Self-pay | Admitting: Family Medicine

## 2013-03-02 DIAGNOSIS — R718 Other abnormality of red blood cells: Secondary | ICD-10-CM | POA: Insufficient documentation

## 2013-03-02 DIAGNOSIS — E559 Vitamin D deficiency, unspecified: Secondary | ICD-10-CM | POA: Insufficient documentation

## 2013-03-02 DIAGNOSIS — J309 Allergic rhinitis, unspecified: Secondary | ICD-10-CM | POA: Insufficient documentation

## 2013-03-02 LAB — VITAMIN D 25 HYDROXY (VIT D DEFICIENCY, FRACTURES): Vit D, 25-Hydroxy: 24 ng/mL — ABNORMAL LOW (ref 30–89)

## 2013-03-02 LAB — IRON: Iron: 45 ug/dL (ref 42–145)

## 2013-03-06 ENCOUNTER — Other Ambulatory Visit: Payer: Self-pay | Admitting: *Deleted

## 2013-03-06 ENCOUNTER — Encounter: Payer: Self-pay | Admitting: Family Medicine

## 2013-03-06 DIAGNOSIS — E559 Vitamin D deficiency, unspecified: Secondary | ICD-10-CM

## 2013-03-06 MED ORDER — ERGOCALCIFEROL 1.25 MG (50000 UT) PO CAPS: 50000.0000 [IU] | ORAL_CAPSULE | ORAL | Status: DC

## 2013-03-08 ENCOUNTER — Encounter: Payer: Self-pay | Admitting: Internal Medicine

## 2013-11-08 ENCOUNTER — Encounter (HOSPITAL_COMMUNITY): Payer: Self-pay | Admitting: Emergency Medicine

## 2013-11-08 ENCOUNTER — Emergency Department (INDEPENDENT_AMBULATORY_CARE_PROVIDER_SITE_OTHER)
Admission: EM | Admit: 2013-11-08 | Discharge: 2013-11-08 | Disposition: A | Discharge status: Home / Self Care | Payer: Self-pay | Source: Home / Self Care | Attending: Family Medicine | Admitting: Family Medicine

## 2013-11-08 DIAGNOSIS — J309 Allergic rhinitis, unspecified: Secondary | ICD-10-CM

## 2013-11-08 DIAGNOSIS — J302 Other seasonal allergic rhinitis: Secondary | ICD-10-CM

## 2013-11-08 MED ORDER — FEXOFENADINE HCL 180 MG PO TABS: 180.0000 mg | ORAL_TABLET | Freq: Every day | ORAL | Status: DC

## 2013-11-08 MED ORDER — FLUTICASONE PROPIONATE 50 MCG/ACT NA SUSP
1.0000 | Freq: Two times a day (BID) | NASAL | Status: DC
Start: 2013-11-08 — End: 2014-05-09

## 2013-11-08 MED ORDER — METHYLPREDNISOLONE ACETATE 40 MG/ML IJ SUSP
80.0000 mg | Freq: Once | INTRAMUSCULAR | Status: AC
  Administered 2013-11-08: 80 mg via INTRAMUSCULAR

## 2013-11-08 MED ORDER — METHYLPREDNISOLONE ACETATE 80 MG/ML IJ SUSP
INTRAMUSCULAR | Status: AC
  Filled 2013-11-08: qty 1

## 2013-11-08 NOTE — ED Notes (Signed)
Patient complains of cough, nasal congestion Pressure under her eye

## 2013-11-08 NOTE — ED Provider Notes (Signed)
CSN: [id number]     Arrival date & time 11/08/13  1628 History   First MD Initiated Contact with Patient 11/08/13 1800     Chief Complaint  Patient presents with  . Facial Pain   (Consider location/radiation/quality/duration/timing/severity/associated sxs/prior Treatment) Patient is a 33 y.o. female presenting with URI.  URI Presenting symptoms: congestion, cough, facial pain and rhinorrhea   Severity:  Mild Onset quality:  Sudden Duration:  1 week Progression:  Worsening Chronicity:  New Relieved by:  None tried Worsened by:  Nothing tried Associated symptoms: sinus pain and sneezing   Associated symptoms: no wheezing   Risk factors comment:  H/o seasonal allergy   Past Medical History  Diagnosis Date  . Obesity   . Seasonal allergies    History reviewed. No pertinent past surgical history. Family History  Problem Relation Age of Onset  . Hypertension Sister   . Hypertension Brother   . Diabetes Mother   . Heart failure Father   . Hypertension Father   . Cancer Neg Hx   . Stroke Neg Hx    History  Substance Use Topics  . Smoking status: Former Smoker    Types: Cigarettes    Quit date: 09/01/2009  . Smokeless tobacco: Not on file  . Alcohol Use: No   OB History   Grav Para Term Preterm Abortions TAB SAB Ect Mult Living   0 0 0 0 0 0 0 0 0 0      Review of Systems  Constitutional: Negative.   HENT: Positive for congestion, postnasal drip, rhinorrhea, sinus pressure and sneezing.   Respiratory: Positive for cough. Negative for wheezing.     Allergies  Penicillins  Home Medications   Current Outpatient Rx  Name  Route  Sig  Dispense  Refill  . ergocalciferol (VITAMIN D2) 50000 UNITS capsule   Oral   Take 1 capsule (50,000 Units total) by mouth once a week.   8 capsule   0   . fexofenadine (ALLEGRA) 180 MG tablet   Oral   Take 1 tablet (180 mg total) by mouth daily.   30 tablet   1   . fluticasone (FLONASE) 50 MCG/ACT nasal spray   Nasal  Place 2 sprays into the nose daily.   16 g   0   . fluticasone (FLONASE) 50 MCG/ACT nasal spray   Each Nare   Place 1 spray into both nostrils 2 (two) times daily.   1 g   2   . ibuprofen (ADVIL,MOTRIN) 200 MG tablet   Oral   Take 400 mg by mouth every 6 (six) hours as needed for pain.          BP 159/97  Pulse 78  Temp(Src) 97 F (36.1 C) (Oral)  Resp 18  SpO2 99% Physical Exam  Nursing note and vitals reviewed. Constitutional: She is oriented to person, place, and time. She appears well-developed and well-nourished. No distress.  HENT:  Head: Normocephalic.  Right Ear: External ear normal.  Left Ear: External ear normal.  Mouth/Throat: Oropharynx is clear and moist.  Neck: Normal range of motion. Neck supple.  Pulmonary/Chest: Effort normal and breath sounds normal. She has no wheezes.  Lymphadenopathy:    She has no cervical adenopathy.  Neurological: She is alert and oriented to person, place, and time.  Skin: Skin is warm and dry.    ED Course  Procedures (including critical care time) Labs Review Labs Reviewed - No data to display Imaging Review No results found.  MDM   1. Seasonal allergic rhinitis       Linna HoffJames D Kindl, MD 11/08/13 1810

## 2014-03-13 ENCOUNTER — Encounter (HOSPITAL_COMMUNITY): Payer: Self-pay | Admitting: Emergency Medicine

## 2014-03-13 ENCOUNTER — Emergency Department (INDEPENDENT_AMBULATORY_CARE_PROVIDER_SITE_OTHER)
Admission: EM | Admit: 2014-03-13 | Discharge: 2014-03-13 | Disposition: A | Discharge status: Home / Self Care | Payer: 59 | Source: Home / Self Care | Attending: Family Medicine | Admitting: Family Medicine

## 2014-03-13 DIAGNOSIS — M79672 Pain in left foot: Secondary | ICD-10-CM

## 2014-03-13 DIAGNOSIS — M79609 Pain in unspecified limb: Secondary | ICD-10-CM

## 2014-03-13 MED ORDER — INDOMETHACIN 25 MG PO CAPS: 25.0000 mg | ORAL_CAPSULE | Freq: Three times a day (TID) | ORAL | Status: DC | PRN

## 2014-03-13 MED ORDER — IBUPROFEN 800 MG PO TABS
800.0000 mg | ORAL_TABLET | Freq: Once | ORAL | Status: AC
  Administered 2014-03-13: 800 mg via ORAL

## 2014-03-13 MED ORDER — IBUPROFEN 800 MG PO TABS
ORAL_TABLET | ORAL | Status: AC
  Filled 2014-03-13: qty 1

## 2014-03-13 NOTE — ED Provider Notes (Signed)
CSN: [id number]     Arrival date & time 03/13/14  1524 History   First MD Initiated Contact with Patient 03/13/14 1616     Chief Complaint  Patient presents with  . Foot Injury   (Consider location/radiation/quality/duration/timing/severity/associated sxs/prior Treatment) HPI Comments: Patient presents with acute exacerbation of chronic left foot pain. Patient is morbidly obese and has had difficulty with left foot pain in the past. No hx of gout. Pain has intensified over past 24 hours without report of injury, fever, swelling.   Patient is a 33 y.o. female presenting with lower extremity pain. The history is provided by the patient.  Foot Pain This is a chronic problem.    Past Medical History  Diagnosis Date  . Obesity   . Seasonal allergies    History reviewed. No pertinent past surgical history. Family History  Problem Relation Age of Onset  . Hypertension Sister   . Hypertension Brother   . Diabetes Mother   . Heart failure Father   . Hypertension Father   . Cancer Neg Hx   . Stroke Neg Hx    History  Substance Use Topics  . Smoking status: Former Smoker    Types: Cigarettes    Quit date: 09/01/2009  . Smokeless tobacco: Not on file  . Alcohol Use: No   OB History   Grav Para Term Preterm Abortions TAB SAB Ect Mult Living   0 0 0 0 0 0 0 0 0 0      Review of Systems  All other systems reviewed and are negative.   Allergies  Penicillins  Home Medications   Prior to Admission medications   Medication Sig Start Date End Date Taking? Authorizing Provider  ergocalciferol (VITAMIN D2) 50000 UNITS capsule Take 1 capsule (50,000 Units total) by mouth once a week. 03/06/13   Joselyn Arrow, MD  fexofenadine (ALLEGRA) 180 MG tablet Take 1 tablet (180 mg total) by mouth daily. 11/08/13   Linna Hoff, MD  fluticasone (FLONASE) 50 MCG/ACT nasal spray Place 2 sprays into the nose daily. 01/25/13   Reuben Likes, MD  fluticasone (FLONASE) 50 MCG/ACT nasal spray Place 1 spray  into both nostrils 2 (two) times daily. 11/08/13   Linna Hoff, MD  ibuprofen (ADVIL,MOTRIN) 200 MG tablet Take 400 mg by mouth every 6 (six) hours as needed for pain.    Historical Provider, MD  indomethacin (INDOCIN) 25 MG capsule Take 1 capsule (25 mg total) by mouth 3 (three) times daily as needed for mild pain or moderate pain. 03/13/14   Jess Barters Ysidro Ramsay, PA   BP 159/81  Pulse 62  Temp(Src) 98.2 F (36.8 C) (Oral)  Resp 14  SpO2 98%  LMP 02/16/2014 Physical Exam  Nursing note and vitals reviewed. Constitutional: She is oriented to person, place, and time. She appears well-developed and well-nourished. No distress.  +obese  HENT:  Head: Normocephalic and atraumatic.  Eyes: Conjunctivae are normal.  Cardiovascular: Normal rate.   Pulmonary/Chest: Effort normal.  Musculoskeletal:       Left foot: She exhibits tenderness. She exhibits normal range of motion, no bony tenderness, no swelling, normal capillary refill, no crepitus, no deformity and no laceration.       Feet:  No STS, ecchymosis, edema, erythema. CSM exam of left foot normal and intact  Neurological: She is alert and oriented to person, place, and time.  Skin: Skin is warm and dry. No rash noted. No erythema.  Psychiatric: She has a normal mood and  affect. Her behavior is normal.    ED Course  Procedures (including critical care time) Labs Review Labs Reviewed - No data to display  Imaging Review No results found.   MDM   1. Left foot pain   Ice, elevation and indocin as prescribed with PCP follow up if no improvement.    Jess BartersJennifer Lee LuttrellPresson, GeorgiaPA 03/13/14 [phone]

## 2014-03-13 NOTE — ED Notes (Signed)
Pt  Reports  l  Foot  Pain  X  sev  Days  denys  Any  Injury  Pain  Is  Worse  On  Weight  Bearing  And  Pt  Is  On her  Feet  A  Lot at  Work

## 2014-03-15 NOTE — ED Provider Notes (Signed)
Medical screening examination/treatment/procedure(s) were performed by a resident physician or non-physician practitioner and as the supervising physician I was immediately available for consultation/collaboration.  Kaelan Amble, MD    Glorimar Stroope S Paradise Vensel, MD 03/15/14 0731 

## 2014-05-09 ENCOUNTER — Ambulatory Visit (INDEPENDENT_AMBULATORY_CARE_PROVIDER_SITE_OTHER): Payer: PRIVATE HEALTH INSURANCE | Admitting: Family Medicine

## 2014-05-09 ENCOUNTER — Encounter: Payer: Self-pay | Admitting: Family Medicine

## 2014-05-09 VITALS — BP 122/76 | HR 64 | Temp 98.5°F | Ht 65.0 in | Wt 335.0 lb

## 2014-05-09 DIAGNOSIS — Z6841 Body Mass Index (BMI) 40.0 and over, adult: Secondary | ICD-10-CM

## 2014-05-09 DIAGNOSIS — J309 Allergic rhinitis, unspecified: Secondary | ICD-10-CM

## 2014-05-09 DIAGNOSIS — Z0000 Encounter for general adult medical examination without abnormal findings: Secondary | ICD-10-CM

## 2014-05-09 DIAGNOSIS — E559 Vitamin D deficiency, unspecified: Secondary | ICD-10-CM

## 2014-05-09 DIAGNOSIS — R5383 Other fatigue: Secondary | ICD-10-CM

## 2014-05-09 DIAGNOSIS — M25569 Pain in unspecified knee: Secondary | ICD-10-CM

## 2014-05-09 DIAGNOSIS — M25561 Pain in right knee: Secondary | ICD-10-CM

## 2014-05-09 DIAGNOSIS — Z131 Encounter for screening for diabetes mellitus: Secondary | ICD-10-CM

## 2014-05-09 DIAGNOSIS — R5381 Other malaise: Secondary | ICD-10-CM

## 2014-05-09 DIAGNOSIS — Z23 Encounter for immunization: Secondary | ICD-10-CM

## 2014-05-09 LAB — POCT URINALYSIS DIPSTICK
BILIRUBIN UA: NEGATIVE
Blood, UA: NEGATIVE
Glucose, UA: NEGATIVE
KETONES UA: NEGATIVE
LEUKOCYTES UA: NEGATIVE
NITRITE UA: NEGATIVE
PH UA: 5
Protein, UA: NEGATIVE
Spec Grav, UA: 1.02
Urobilinogen, UA: NEGATIVE

## 2014-05-09 NOTE — Progress Notes (Signed)
Chief Complaint  Patient presents with  . Annual Exam    nonfasting annual exam with pap. (down 28 lbs from last visit 7/14). Is having some cough and congestion x 1 week -mucus is yellowish. Right knee pain for a while. Would like flu vaccine today if okay to have with her other symptoms.    Marissa Lowe is a 33 y.o. female who presents for a complete physical.  She has the following concerns:  She is having nasal congestion, chest congestion and cough for about a week.  Nasal mucus is mainly clear, phlegm is slightly yellow.  Denies shortness of breath.  No fevers.  +sneezing, itchy eyes.  She restarted flonase 3 days ago, hasn't yet restarted allegra. She is having headaches between her eyes and in the forehead over the last week, related to illness/congestion.  Denies any vertigo/dizziness.  Right knee pain for about 2 months. It catches when she stands up after sitting for a while, improves as she walks around. It sometimes swells.  Hasn't taken any meds for it.  She doesn't recall any injury, twisting, fall or any change in activity prior to onset of pain.  Sometimes it feels unstable, but hasn't fallen--able to catch herself.  She went to Urgent care 2 months ago with left foot pain--x-rays showed arthritis.  She was given indocin, which helped.  Foot pain has improved.  Not needing to take the anti-inflammatory medication for the foot pain.  She cut back on starches, eating more fiber, more vegetables, smaller portion sizes. Walks 30 minutes after work, 4 days/week. She has lost 28 pounds since her last physical in 02/2013.  Vitamin D deficiency--she has been taking 1000 IU of vitamin D daily  Microcytosis noted previously  Immunization History  Administered Date(s) Administered  . Tdap 03/01/2013   Last Pap smear: 02/2013--normal, no high risk HPV (and neg GC/chlamydia) Last mammogram: never  Last colonoscopy: never  Last DEXA: never  Dentist: twice yearly  Ophtho: never   Exercise: Walks 30 minutes after work, plus is on her feet all day at work (constantly moving)  Past Medical History  Diagnosis Date  . Obesity   . Seasonal allergies   . Vitamin D deficiency   . Arthritis of foot, left     (see imaging 02/2014)    History reviewed. No pertinent past surgical history.  History   Social History  . Marital Status: Single    Spouse Name: N/A    Number of Children: N/A  . Years of Education: N/A   Occupational History  . works in Pharmacologist room at [employer] History Main Topics  . Smoking status: Former Smoker    Types: Cigarettes    Quit date: 09/01/2009  . Smokeless tobacco: Never Used  . Alcohol Use: No  . Drug Use: No  . Sexual Activity: Yes    Partners: Male    Birth Control/ Protection: Condom   Other Topics Concern  . Not on file   Social History Narrative   Lives with her sister, 1 dog.  No tobacco exposure    Family History  Problem Relation Age of Onset  . Hypertension Sister   . Hypertension Brother   . Diabetes Mother   . Heart failure Father   . Hypertension Father   . Cancer Neg Hx   . Stroke Neg Hx    Outpatient Encounter Prescriptions as of 05/09/2014  Medication Sig Note  . cholecalciferol (VITAMIN D) 1000 UNITS tablet Take  1,000 Units by mouth daily.   . fluticasone (FLONASE) 50 MCG/ACT nasal spray Place 2 sprays into the nose daily. 05/09/2014: Restarted 3 days ago  . fexofenadine (ALLEGRA) 180 MG tablet Take 1 tablet (180 mg total) by mouth daily. 05/09/2014: Uses prn, not recently  . indomethacin (INDOCIN) 25 MG capsule Take 1 capsule (25 mg total) by mouth 3 (three) times daily as needed for mild pain or moderate pain. 05/09/2014: Uses prn--not currently (for arthritis in left foot)  . [DISCONTINUED] ergocalciferol (VITAMIN D2) 50000 UNITS capsule Take 1 capsule (50,000 Units total) by mouth once a week.   . [DISCONTINUED] fluticasone (FLONASE) 50 MCG/ACT nasal spray Place 1 spray into both nostrils  2 (two) times daily.   . [DISCONTINUED] ibuprofen (ADVIL,MOTRIN) 200 MG tablet Take 400 mg by mouth every 6 (six) hours as needed for pain.     Allergies  Allergen Reactions  . Penicillins Hives   ROS: The patient denies anorexia, fever, vision changes, decreased hearing, ear pain, sore throat, breast concerns, chest pain (just due to coughing with recent illness), palpitations, lightheadedness, syncope, dyspnea on exertion, cough, swelling, nausea, vomiting, diarrhea, constipation, abdominal pain, melena, hematochezia, indigestion/heartburn, hematuria, incontinence, dysuria, irregular menstrual cycles, vaginal discharge, odor or itch, genital lesions,numbness, tingling, weakness, tremor, suspicious skin lesions, depression, anxiety, abnormal bleeding/bruising, or enlarged lymph nodes.  Periods are heavy, cramping x 3 days. Regular every month. +URI/allergy complaints with frontal headaches--see HPI +right knee pain L foot pain resolved 28 pound weight loss in the last year  PHYSICAL EXAM:  BP 122/76  Pulse 64  Temp(Src) 98.5 F (36.9 C) (Tympanic)  Ht  (1.651 m)  Wt 335 lb (151.955 kg)  BMI 55.75 kg/m2  LMP 04/08/2014  General Appearance:  Alert, cooperative, no distress, appears stated age, morbidly obese   Head:  Normocephalic, without obvious abnormality, atraumatic. Mild sinus tenderness x 4, frontal>maxillary   Eyes:  PERRL, conjunctiva/corneas clear, EOM's intact, fundi  benign   Ears:  Normal TM's and external ear canals   Nose:  Nares normal, mucosa is moderately edematous, no purulence  Throat:  Lips and tongue normal; teeth and gums normal.    Neck:  Supple, no lymphadenopathy; thyroid: no enlargement/tenderness/nodules; no carotid  bruit or JVD   Back:  Spine nontender, no curvature, ROM normal, no CVA tenderness   Lungs:  Clear to auscultation bilaterally without wheezes, rales or ronchi; respirations unlabored   Chest Wall:  No tenderness or deformity   Heart:   Regular rate and rhythm, S1 and S2 normal, no murmur, rub  or gallop   Breast Exam:  No tenderness, masses, or nipple discharge or inversion. No axillary lymphadenopathy   Abdomen:  Soft, non-tender, nondistended, normoactive bowel sounds, no masses, no hepatosplenomegaly. obese   Genitalia:  Normal external genitalia without lesions. BUS and vagina normal; no cervical motion tenderness. No abnormal vaginal discharge. Uterus and adnexa nontender, no masses could be appreciated, but exam was limited by her body habitus. Pap not performed   Rectal:  Not performed due to age<40 and no related complaints   Extremities:  No clubbing, cyanosis or edema. R knee--no warmth, effusion or swelling noted. Area of discomfort is over patellar tendon, below kneecap.  No crepitus. Normal exam. Hands and feet are cool/slightly diaphoretic (she gives hx of excessive sweating)  Pulses:  2+ and symmetric all extremities   Skin:  Skin color, texture, turgor normal, no rashes or lesions. +tatoos   Lymph nodes:  Cervical, supraclavicular, and axillary  nodes normal   Neurologic:  CNII-XII intact, normal strength, sensation and gait; reflexes 2+ and symmetric throughout                    Psych: Normal mood, affect, hygiene and grooming.    ASSESSMENT/PLAN:  Routine general medical examination at a health care facility - Plan: Visual acuity screening, POCT Urinalysis Dipstick, CBC with Differential, Vit D  25 hydroxy (rtn osteoporosis monitoring), TSH, Glucose, random  Unspecified vitamin D deficiency - Plan: Vit D  25 hydroxy (rtn osteoporosis monitoring)  Other malaise and fatigue - Plan: CBC with Differential, Vit D  25 hydroxy (rtn osteoporosis monitoring), TSH  Screening for diabetes mellitus - Plan: Glucose, random  Need for prophylactic vaccination and inoculation against influenza - Plan: Flu Vaccine QUAD 36+ mos PF IM (Fluarix Quad PF)  Morbid obesity with BMI of 50.0-59.9, adult - congratulated on  weight loss thus far--encouraged her to continue with healthy eating plan and regular exercise (increase)  Allergic rhinitis, cause unspecified - continue Flonase, restart antihistamines; mucinex prn  Right knee pain - suspect patellar tendonitis vs bursitis (not currently swollen).  NSAID's prn (has indocin).    Suspect mild hyperhidrosis.  Suspect either patellar tendonitis vs bursitis based on location of discomfort.  Normal exam.  Use NSAIDs prn (she has rx indocin from foot that she can use).   Drink plenty of fluids. Continue the Flonase. Restart Allegra once daily. You can use decongestant such as sudafed as needed for sinus pain, ear plugging and congestion. You can use Mucinex (guaifenesin) as needed as an expectorant to loosen up the phlegm/mucus. Call or return if you develop fever, persistent discolored mucus, sinus pain, etc.  Discussed monthly self breast exams and yearly mammograms after the age of 84; at least 30 minutes of aerobic activity at least 5 days/week; proper sunscreen use reviewed; healthy diet, including goals of calcium and vitamin D intake and alcohol recommendations (less than or equal to 1 drink/day) reviewed; regular seatbelt use; changing batteries in smoke detectors.  Immunization recommendations discussed--flu shot today.  Colonoscopy recommendations reviewed--age 73  Consider routine eye exam  Return next week for fasting labs Glucose, CBC and vitamin D, TSH

## 2014-05-09 NOTE — Patient Instructions (Addendum)
  HEALTH MAINTENANCE RECOMMENDATIONS:  It is recommended that you get at least 30 minutes of aerobic exercise at least 5 days/week (for weight loss, you may need as much as 60-90 minutes). This can be any activity that gets your heart rate up. This can be divided in 10-15 minute intervals if needed, but try and build up your endurance at least once a week.  Weight bearing exercise is also recommended twice weekly.  Eat a healthy diet with lots of vegetables, fruits and fiber.  "Colorful" foods have a lot of vitamins (ie green vegetables, tomatoes, red peppers, etc).  Limit sweet tea, regular sodas and alcoholic beverages, all of which has a lot of calories and sugar.  Up to 1 alcoholic drink daily may be beneficial for women (unless trying to lose weight, watch sugars).  Drink a lot of water.  Calcium recommendations are 1200-1500 mg daily (1500 mg for postmenopausal women or women without ovaries), and vitamin D 1000 IU daily.  This should be obtained from diet and/or supplements (vitamins), and calcium should not be taken all at once, but in divided doses.  Monthly self breast exams and yearly mammograms for women over the age of 62 is recommended.  Sunscreen of at least SPF 30 should be used on all sun-exposed parts of the skin when outside between the hours of 10 am and 4 pm (not just when at beach or pool, but even with exercise, golf, tennis, and yard work!)  Use a sunscreen that says "broad spectrum" so it covers both UVA and UVB rays, and make sure to reapply every 1-2 hours.  Remember to change the batteries in your smoke detectors when changing your clock times in the spring and fall.  Use your seat belt every time you are in a car, and please drive safely and not be distracted with cell phones and texting while driving.  You do not have evidence of a bacterial infection--I suspect sinus congestion, and drainage related to allergies. Drink plenty of fluids. Continue the Flonase. Restart  Allegra once daily. You can use decongestant such as sudafed as needed for sinus pain, ear plugging and congestion. You can use Mucinex (guaifenesin) as needed as an expectorant to loosen up the phlegm/mucus. Call or return if you develop fever, persistent discolored mucus, sinus pain, etc.   Right knee pain might be due to tendonitis vs bursitis.  No evidence of significant internal damage.  Use the indocin (medication that you were prescribed for your foot arthritis) as needed for the knee pain. Return for re-evaluation if worsening knee pain, persistent swelling, giving way of the knee with falls, or any other concerns.  Consider scheduling a routine eye exam.

## 2014-05-13 ENCOUNTER — Other Ambulatory Visit: Payer: PRIVATE HEALTH INSURANCE

## 2014-05-13 DIAGNOSIS — E559 Vitamin D deficiency, unspecified: Secondary | ICD-10-CM

## 2014-05-13 DIAGNOSIS — R5381 Other malaise: Secondary | ICD-10-CM

## 2014-05-13 DIAGNOSIS — R5383 Other fatigue: Secondary | ICD-10-CM

## 2014-05-13 DIAGNOSIS — Z131 Encounter for screening for diabetes mellitus: Secondary | ICD-10-CM

## 2014-05-13 DIAGNOSIS — Z0000 Encounter for general adult medical examination without abnormal findings: Secondary | ICD-10-CM

## 2014-05-13 LAB — CBC WITH DIFFERENTIAL/PLATELET
BASOS PCT: 0 % (ref 0–1)
Basophils Absolute: 0 10*3/uL (ref 0.0–0.1)
Eosinophils Absolute: 0.2 10*3/uL (ref 0.0–0.7)
Eosinophils Relative: 3 % (ref 0–5)
HEMATOCRIT: 37.2 % (ref 36.0–46.0)
HEMOGLOBIN: 12.3 g/dL (ref 12.0–15.0)
Lymphocytes Relative: 38 % (ref 12–46)
Lymphs Abs: 2 10*3/uL (ref 0.7–4.0)
MCH: 23.6 pg — ABNORMAL LOW (ref 26.0–34.0)
MCHC: 33.1 g/dL (ref 30.0–36.0)
MCV: 71.3 fL — ABNORMAL LOW (ref 78.0–100.0)
MONOS PCT: 8 % (ref 3–12)
Monocytes Absolute: 0.4 10*3/uL (ref 0.1–1.0)
NEUTROS ABS: 2.7 10*3/uL (ref 1.7–7.7)
Neutrophils Relative %: 51 % (ref 43–77)
Platelets: 278 10*3/uL (ref 150–400)
RBC: 5.22 MIL/uL — AB (ref 3.87–5.11)
RDW: 17.2 % — ABNORMAL HIGH (ref 11.5–15.5)
WBC: 5.2 10*3/uL (ref 4.0–10.5)

## 2014-05-14 ENCOUNTER — Other Ambulatory Visit: Payer: Self-pay | Admitting: *Deleted

## 2014-05-14 DIAGNOSIS — E559 Vitamin D deficiency, unspecified: Secondary | ICD-10-CM

## 2014-05-14 LAB — VITAMIN D 25 HYDROXY (VIT D DEFICIENCY, FRACTURES): Vit D, 25-Hydroxy: 18 ng/mL — ABNORMAL LOW (ref 30–89)

## 2014-05-14 LAB — TSH: TSH: 2.461 u[IU]/mL (ref 0.350–4.500)

## 2014-05-14 LAB — GLUCOSE, RANDOM: GLUCOSE: 86 mg/dL (ref 70–99)

## 2014-05-14 MED ORDER — ERGOCALCIFEROL 1.25 MG (50000 UT) PO CAPS: 50000.0000 [IU] | ORAL_CAPSULE | ORAL | Status: DC

## 2014-08-28 ENCOUNTER — Encounter: Payer: PRIVATE HEALTH INSURANCE | Admitting: Family Medicine

## 2014-10-07 ENCOUNTER — Emergency Department (HOSPITAL_COMMUNITY)
Admission: EM | Admit: 2014-10-07 | Discharge: 2014-10-08 | Disposition: A | Discharge status: Home / Self Care | Payer: Managed Care, Other (non HMO) | Attending: Emergency Medicine | Admitting: Emergency Medicine

## 2014-10-07 ENCOUNTER — Emergency Department (HOSPITAL_COMMUNITY): Payer: Managed Care, Other (non HMO)

## 2014-10-07 ENCOUNTER — Encounter (HOSPITAL_COMMUNITY): Payer: Self-pay

## 2014-10-07 ENCOUNTER — Emergency Department (INDEPENDENT_AMBULATORY_CARE_PROVIDER_SITE_OTHER)
Admission: EM | Admit: 2014-10-07 | Discharge: 2014-10-07 | Disposition: A | Discharge status: Nursing Home (Includes ICF) | Payer: Managed Care, Other (non HMO) | Source: Home / Self Care | Attending: Family Medicine | Admitting: Family Medicine

## 2014-10-07 ENCOUNTER — Encounter (HOSPITAL_COMMUNITY): Payer: Self-pay | Admitting: Emergency Medicine

## 2014-10-07 DIAGNOSIS — Z87891 Personal history of nicotine dependence: Secondary | ICD-10-CM | POA: Diagnosis not present

## 2014-10-07 DIAGNOSIS — E669 Obesity, unspecified: Secondary | ICD-10-CM | POA: Diagnosis not present

## 2014-10-07 DIAGNOSIS — R1011 Right upper quadrant pain: Secondary | ICD-10-CM | POA: Diagnosis present

## 2014-10-07 DIAGNOSIS — Z3202 Encounter for pregnancy test, result negative: Secondary | ICD-10-CM | POA: Diagnosis not present

## 2014-10-07 DIAGNOSIS — M13872 Other specified arthritis, left ankle and foot: Secondary | ICD-10-CM | POA: Insufficient documentation

## 2014-10-07 DIAGNOSIS — E559 Vitamin D deficiency, unspecified: Secondary | ICD-10-CM | POA: Diagnosis not present

## 2014-10-07 DIAGNOSIS — Z79899 Other long term (current) drug therapy: Secondary | ICD-10-CM | POA: Insufficient documentation

## 2014-10-07 DIAGNOSIS — R110 Nausea: Secondary | ICD-10-CM | POA: Diagnosis not present

## 2014-10-07 DIAGNOSIS — Z880 Allergy status to penicillin: Secondary | ICD-10-CM | POA: Diagnosis not present

## 2014-10-07 DIAGNOSIS — Z7951 Long term (current) use of inhaled steroids: Secondary | ICD-10-CM | POA: Insufficient documentation

## 2014-10-07 DIAGNOSIS — R109 Unspecified abdominal pain: Secondary | ICD-10-CM

## 2014-10-07 LAB — COMPREHENSIVE METABOLIC PANEL
ALBUMIN: 3.6 g/dL (ref 3.5–5.2)
ALT: 16 U/L (ref 0–35)
AST: 16 U/L (ref 0–37)
Alkaline Phosphatase: 66 U/L (ref 39–117)
Anion gap: 6 (ref 5–15)
BUN: 8 mg/dL (ref 6–23)
CALCIUM: 8.8 mg/dL (ref 8.4–10.5)
CHLORIDE: 104 mmol/L (ref 96–112)
CO2: 28 mmol/L (ref 19–32)
Creatinine, Ser: 0.72 mg/dL (ref 0.50–1.10)
GFR calc Af Amer: >90 mL/min (ref >90)
Glucose, Bld: 86 mg/dL (ref 70–99)
POTASSIUM: 3.5 mmol/L (ref 3.5–5.1)
Sodium: 138 mmol/L (ref 135–145)
Total Bilirubin: <0.1 mg/dL — ABNORMAL LOW (ref 0.3–1.2)
Total Protein: 6.7 g/dL (ref 6.0–8.3)

## 2014-10-07 LAB — CBC WITH DIFFERENTIAL/PLATELET
BASOS ABS: 0 10*3/uL (ref 0.0–0.1)
BASOS PCT: 0 % (ref 0–1)
Eosinophils Absolute: 0.2 10*3/uL (ref 0.0–0.7)
Eosinophils Relative: 2 % (ref 0–5)
HCT: 35.3 % — ABNORMAL LOW (ref 36.0–46.0)
Hemoglobin: 12.3 g/dL (ref 12.0–15.0)
Lymphocytes Relative: 43 % (ref 12–46)
Lymphs Abs: 3.5 10*3/uL (ref 0.7–4.0)
MCH: 24 pg — ABNORMAL LOW (ref 26.0–34.0)
MCHC: 34.8 g/dL (ref 30.0–36.0)
MCV: 68.8 fL — ABNORMAL LOW (ref 78.0–100.0)
Monocytes Absolute: 0.4 10*3/uL (ref 0.1–1.0)
Monocytes Relative: 5 % (ref 3–12)
Neutro Abs: 4 10*3/uL (ref 1.7–7.7)
Neutrophils Relative %: 50 % (ref 43–77)
PLATELETS: 296 10*3/uL (ref 150–400)
RBC: 5.13 MIL/uL — ABNORMAL HIGH (ref 3.87–5.11)
RDW: 16.2 % — AB (ref 11.5–15.5)
WBC: 8.1 10*3/uL (ref 4.0–10.5)

## 2014-10-07 LAB — POCT URINALYSIS DIP (DEVICE)
Glucose, UA: NEGATIVE mg/dL
Nitrite: NEGATIVE
Protein, ur: NEGATIVE mg/dL
Specific Gravity, Urine: >=1.03 (ref 1.005–1.030)
UROBILINOGEN UA: 0.2 mg/dL (ref 0.0–1.0)
pH: 5 (ref 5.0–8.0)

## 2014-10-07 LAB — I-STAT BETA HCG BLOOD, ED (MC, WL, AP ONLY): I-stat hCG, quantitative: <5 m[IU]/mL (ref <5)

## 2014-10-07 LAB — LIPASE, BLOOD: Lipase: 47 U/L (ref 11–59)

## 2014-10-07 LAB — POCT PREGNANCY, URINE: PREG TEST UR: NEGATIVE

## 2014-10-07 MED ORDER — HYDROCODONE-ACETAMINOPHEN 5-325 MG PO TABS: 1.0000 | ORAL_TABLET | Freq: Four times a day (QID) | ORAL | Status: DC | PRN

## 2014-10-07 MED ORDER — SODIUM CHLORIDE 0.9 % IV BOLUS (SEPSIS)
1000.0000 mL | Freq: Once | INTRAVENOUS | Status: AC
  Administered 2014-10-07: 1000 mL via INTRAVENOUS

## 2014-10-07 MED ORDER — MORPHINE SULFATE 4 MG/ML IJ SOLN
4.0000 mg | Freq: Once | INTRAMUSCULAR | Status: AC
  Administered 2014-10-07: 4 mg via INTRAVENOUS
  Filled 2014-10-07: qty 1

## 2014-10-07 NOTE — Discharge Instructions (Signed)
Please call your doctor for a followup appointment within 24-48 hours. When you talk to your doctor please let them know that you were seen in the emergency department and have them acquire all of your records so that they can discuss the findings with you and formulate a treatment plan to fully care for your new and ongoing problems. Please call and set-up an appointment with your primary care provider Please rest and stay hydrated Please avoid any foods high in fat, grease, cholesterol Please drink plenty of water  Please take medications as prescribed - while on pain medications there is to be no drinking alcohol, driving, operating any heavy machinery. If extra please dispose in a proper manner. Please do not take any extra Tylenol with this medication for this can lead to Tylenol overdose and liver issues.  Please continue to monitor symptoms closely and if symptoms are to worsen or change (fever greater than 101, chills, sweating, nausea, vomiting, chest pain, shortness of breathe, difficulty breathing, weakness, numbness, tingling, worsening or changes to pain pattern, inability keep food or fluids down, back pain) please report back to the Emergency Department immediately.    Abdominal Pain, Women Abdominal (stomach, pelvic, or belly) pain can be caused by many things. It is important to tell your doctor:  The location of the pain.  Does it come and go or is it present all the time?  Are there things that start the pain (eating certain foods, exercise)?  Are there other symptoms associated with the pain (fever, nausea, vomiting, diarrhea)? All of this is helpful to know when trying to find the cause of the pain. CAUSES   Stomach: virus or bacteria infection, or ulcer.  Intestine: appendicitis (inflamed appendix), regional ileitis (Crohn's disease), ulcerative colitis (inflamed colon), irritable bowel syndrome, diverticulitis (inflamed diverticulum of the colon), or cancer of the  stomach or intestine.  Gallbladder disease or stones in the gallbladder.  Kidney disease, kidney stones, or infection.  Pancreas infection or cancer.  Fibromyalgia (pain disorder).  Diseases of the female organs:  Uterus: fibroid (non-cancerous) tumors or infection.  Fallopian tubes: infection or tubal pregnancy.  Ovary: cysts or tumors.  Pelvic adhesions (scar tissue).  Endometriosis (uterus lining tissue growing in the pelvis and on the pelvic organs).  Pelvic congestion syndrome (female organs filling up with blood just before the menstrual period).  Pain with the menstrual period.  Pain with ovulation (producing an egg).  Pain with an IUD (intrauterine device, birth control) in the uterus.  Cancer of the female organs.  Functional pain (pain not caused by a disease, may improve without treatment).  Psychological pain.  Depression. DIAGNOSIS  Your doctor will decide the seriousness of your pain by doing an examination.  Blood tests.  X-rays.  Ultrasound.  CT scan (computed tomography, special type of X-ray).  MRI (magnetic resonance imaging).  Cultures, for infection.  Barium enema (dye inserted in the large intestine, to better view it with X-rays).  Colonoscopy (looking in intestine with a lighted tube).  Laparoscopy (minor surgery, looking in abdomen with a lighted tube).  Major abdominal exploratory surgery (looking in abdomen with a large incision). TREATMENT  The treatment will depend on the cause of the pain.   Many cases can be observed and treated at home.  Over-the-counter medicines recommended by your caregiver.  Prescription medicine.  Antibiotics, for infection.  Birth control pills, for painful periods or for ovulation pain.  Hormone treatment, for endometriosis.  Nerve blocking injections.  Physical therapy.  Antidepressants.  Counseling with a psychologist or psychiatrist.  Minor or major surgery. HOME CARE  INSTRUCTIONS   Do not take laxatives, unless directed by your caregiver.  Take over-the-counter pain medicine only if ordered by your caregiver. Do not take aspirin because it can cause an upset stomach or bleeding.  Try a clear liquid diet (broth or water) as ordered by your caregiver. Slowly move to a bland diet, as tolerated, if the pain is related to the stomach or intestine.  Have a thermometer and take your temperature several times a day, and record it.  Bed rest and sleep, if it helps the pain.  Avoid sexual intercourse, if it causes pain.  Avoid stressful situations.  Keep your follow-up appointments and tests, as your caregiver orders.  If the pain does not go away with medicine or surgery, you may try:  Acupuncture.  Relaxation exercises (yoga, meditation).  Group therapy.  Counseling. SEEK MEDICAL CARE IF:   You notice certain foods cause stomach pain.  Your home care treatment is not helping your pain.  You need stronger pain medicine.  You want your IUD removed.  You feel faint or lightheaded.  You develop nausea and vomiting.  You develop a rash.  You are having side effects or an allergy to your medicine. SEEK IMMEDIATE MEDICAL CARE IF:   Your pain does not go away or gets worse.  You have a fever.  Your pain is felt only in portions of the abdomen. The right side could possibly be appendicitis. The left lower portion of the abdomen could be colitis or diverticulitis.  You are passing blood in your stools (bright red or black tarry stools, with or without vomiting).  You have blood in your urine.  You develop chills, with or without a fever.  You pass out. MAKE SURE YOU:   Understand these instructions.  Will watch your condition.  Will get help right away if you are not doing well or get worse. Document Released: 06/06/2007 Document Revised: 12/24/2013 Document Reviewed: 06/26/2009 [facility] Patient Information 2015 [place], [place].  This information is not intended to replace advice given to you by your health care provider. Make sure you discuss any questions you have with your health care provider.

## 2014-10-07 NOTE — ED Notes (Signed)
[facility]

## 2014-10-07 NOTE — ED Notes (Signed)
This RN escorted the pt to the lobby.

## 2014-10-07 NOTE — ED Notes (Signed)
C/o abdominal pain/cramping from right back, around abdomen to front of body,  Has nausea, no vomiting, olast bm was Friday and was normal per patientdenies urinary symptoms, denies vaginal discharge

## 2014-10-07 NOTE — ED Notes (Signed)
Urine checked at urgent care.

## 2014-10-07 NOTE — ED Provider Notes (Signed)
CSN: [id number]     Arrival date & time 10/07/14  1818 History   First MD Initiated Contact with Patient 10/07/14 1829     Chief Complaint  Patient presents with  . Abdominal Pain     (Consider location/radiation/quality/duration/timing/severity/associated sxs/prior Treatment) The history is provided by the patient. No language interpreter was used.  Marissa Lowe is a 34 y/o F with PMHx of obesity, seasonal allergies, vitamin D, arthritis presenting to the ED with RUQ abdominal pain that has been ongoing for the past 3 days with pain getting progressive worse. Patient reported that the pain is a knot, as if someone is twisting her stomach. Patient reported that the pain wraps around her back. Stated that she has been experiencing nausea with the discomfort. Reported that nothing makes the pain better or worse - reported that she has not used anything for the pain. Stated that she was seen and assessed at Urgent Care today who recommended patient to come to the ED for further assessment regarding cholecystitis. LMP 10/02/2014. Denied vomiting, pain after eating, diarrhea, melena, hematochezia, dysuria, hematuria, fever, chest pain, shortness of breath, difficulty breathing. Denied history of kidney stones. Denied history of abdominal surgeries. PCP Dr. Lynelle Doctor  Past Medical History  Diagnosis Date  . Obesity   . Seasonal allergies   . Vitamin D deficiency   . Arthritis of foot, left     (see imaging 02/2014)   History reviewed. No pertinent past surgical history. Family History  Problem Relation Age of Onset  . Hypertension Sister   . Hypertension Brother   . Diabetes Mother   . Heart failure Father   . Hypertension Father   . Cancer Neg Hx   . Stroke Neg Hx    History  Substance Use Topics  . Smoking status: Former Smoker    Types: Cigarettes    Quit date: 09/01/2009  . Smokeless tobacco: Never Used  . Alcohol Use: No   OB History    Gravida Para Term Preterm AB TAB SAB Ectopic  Multiple Living   0 0 0 0 0 0 0 0 0 0      Review of Systems  Constitutional: Negative for fever and chills.  Respiratory: Negative for chest tightness and shortness of breath.   Cardiovascular: Negative for chest pain.  Gastrointestinal: Positive for nausea and abdominal pain. Negative for vomiting, diarrhea, constipation, blood in stool and anal bleeding.  Genitourinary: Negative for dysuria, hematuria, flank pain, vaginal bleeding, vaginal discharge, vaginal pain and pelvic pain.  Musculoskeletal: Negative for back pain and neck pain.      Allergies  Penicillins  Home Medications   Prior to Admission medications   Medication Sig Start Date End Date Taking? Authorizing Provider  acetaminophen (TYLENOL) 500 MG tablet Take 1,000 mg by mouth every 6 (six) hours as needed for moderate pain.   Yes Historical Provider, MD  cholecalciferol (VITAMIN D) 1000 UNITS tablet Take 1,000 Units by mouth daily.   Yes Historical Provider, MD  fluticasone (FLONASE) 50 MCG/ACT nasal spray Place 2 sprays into the nose daily. Patient taking differently: Place 2 sprays into the nose as needed for allergies.  01/25/13  Yes Reuben Likes, MD  ergocalciferol (VITAMIN D2) 50000 UNITS capsule Take 1 capsule (50,000 Units total) by mouth once a week. 05/14/14   Joselyn Arrow, MD  fexofenadine (ALLEGRA) 180 MG tablet Take 1 tablet (180 mg total) by mouth daily. Patient taking differently: Take 180 mg by mouth as needed.  11/08/13  Linna Hoff, MD  HYDROcodone-acetaminophen (NORCO/VICODIN) 5-325 MG per tablet Take 1 tablet by mouth every 6 (six) hours as needed for moderate pain or severe pain. 10/07/14   Breigh Annett, PA-C  indomethacin (INDOCIN) 25 MG capsule Take 1 capsule (25 mg total) by mouth 3 (three) times daily as needed for mild pain or moderate pain. 03/13/14   Jess Barters H Presson, PA   BP 114/67 mmHg  Pulse 74  Temp(Src) 98.2 F (36.8 C) (Oral)  Resp 18  Ht  (1.6 m)  Wt 355 lb (161.027 kg)   BMI 62.90 kg/m2  SpO2 100%  LMP 10/02/2014 Physical Exam  Constitutional: She is oriented to person, place, and time. She appears well-developed and well-nourished. No distress.  HENT:  Head: Normocephalic and atraumatic.  Eyes: Conjunctivae and EOM are normal. Right eye exhibits no discharge. Left eye exhibits no discharge.  Neck: Normal range of motion. Neck supple.  Cardiovascular: Normal rate, regular rhythm and normal heart sounds.  Exam reveals no friction rub.   No murmur heard. Pulmonary/Chest: Effort normal and breath sounds normal. No respiratory distress. She has no wheezes. She has no rales.  Abdominal: Soft. Normal appearance and bowel sounds are normal. She exhibits no distension. There is tenderness in the right upper quadrant. There is guarding and positive Murphy's sign. There is no rebound and no CVA tenderness.  Obese   Musculoskeletal: Normal range of motion.  Neurological: She is alert and oriented to person, place, and time. No cranial nerve deficit. She exhibits normal muscle tone. Coordination normal.  Skin: Skin is warm and dry. No rash noted. She is not diaphoretic. No erythema.  Psychiatric: She has a normal mood and affect. Her behavior is normal. Thought content normal.  Nursing note and vitals reviewed.   ED Course  Procedures (including critical care time)  Results for orders placed or performed during the hospital encounter of 10/07/14  Comprehensive metabolic panel  Result Value Ref Range   Sodium 138 135 - 145 mmol/L   Potassium 3.5 3.5 - 5.1 mmol/L   Chloride 104 96 - 112 mmol/L   CO2 28 19 - 32 mmol/L   Glucose, Bld 86 70 - 99 mg/dL   BUN 8 6 - 23 mg/dL   Creatinine, Ser 1.61 0.50 - 1.10 mg/dL   Calcium 8.8 8.4 - 09.6 mg/dL   Total Protein 6.7 6.0 - 8.3 g/dL   Albumin 3.6 3.5 - 5.2 g/dL   AST 16 0 - 37 U/L   ALT 16 0 - 35 U/L   Alkaline Phosphatase 66 39 - 117 U/L   Total Bilirubin <0.1 (L) 0.3 - 1.2 mg/dL   GFR calc non Af Amer >90 >90  mL/min   GFR calc Af Amer >90 >90 mL/min   Anion gap 6 5 - 15  CBC with Differential  Result Value Ref Range   WBC 8.1 4.0 - 10.5 K/uL   RBC 5.13 (H) 3.87 - 5.11 MIL/uL   Hemoglobin 12.3 12.0 - 15.0 g/dL   HCT 04.5 (L) 40.9 - 81.1 %   MCV 68.8 (L) 78.0 - 100.0 fL   MCH 24.0 (L) 26.0 - 34.0 pg   MCHC 34.8 30.0 - 36.0 g/dL   RDW 91.4 (H) 78.2 - 95.6 %   Platelets 296 150 - 400 K/uL   Neutrophils Relative % 50 43 - 77 %   Lymphocytes Relative 43 12 - 46 %   Monocytes Relative 5 3 - 12 %  Eosinophils Relative 2 0 - 5 %   Basophils Relative 0 0 - 1 %   Neutro Abs 4.0 1.7 - 7.7 K/uL   Lymphs Abs 3.5 0.7 - 4.0 K/uL   Monocytes Absolute 0.4 0.1 - 1.0 K/uL   Eosinophils Absolute 0.2 0.0 - 0.7 K/uL   Basophils Absolute 0.0 0.0 - 0.1 K/uL   RBC Morphology TARGET CELLS   Lipase, blood  Result Value Ref Range   Lipase 47 11 - 59 U/L  I-Stat Beta hCG blood, ED ([facility], [facility], [facility] only)  Result Value Ref Range   I-stat hCG, quantitative <5.0 <5 mIU/mL   Comment 3            Labs Review Labs Reviewed  COMPREHENSIVE METABOLIC PANEL - Abnormal; Notable for the following:    Total Bilirubin <0.1 (*)    All other components within normal limits  CBC WITH DIFFERENTIAL/PLATELET - Abnormal; Notable for the following:    RBC 5.13 (*)    HCT 35.3 (*)    MCV 68.8 (*)    MCH 24.0 (*)    RDW 16.2 (*)    All other components within normal limits  LIPASE, BLOOD  I-STAT BETA HCG BLOOD, ED ([facility], [facility], [facility] ONLY)    Imaging Review [place] Abdomen Limited Ruq  10/07/2014   CLINICAL DATA:  Right upper quadrant pain for 4 days.  EXAM: US ABDOMEN LIMITED - RIGHT UPPER QUADRANT  COMPARISON:  None.  FINDINGS: Gallbladder:  No gallstones or wall thickening visualized. No sonographic Murphy sign noted.  Common bile duct:  Diameter: 0.3 cm  Liver:  No focal lesion identified. Within normal limits in parenchymal echogenicity.  IMPRESSION: Negative for gallstones.  Negative exam.   Electronically Signed   By: Drusilla Kannerhomas   Dalessio M.D.   On: 10/07/2014 21:38     EKG Interpretation None       10:47 PM This provider discussed labs and imaging in great detail with patient. Patient reported that the pain has improved with the medications. Patient reported that she would like to eat something because she is hungry.   MDM   Final diagnoses:  Abdominal pain  RUQ pain    Medications  sodium chloride 0.9 % bolus 1,000 mL (1,000 mLs Intravenous New Bag/Given 10/07/14 1946)  morphine 4 MG/ML injection 4 mg (4 mg Intravenous Given 10/07/14 2015)    Filed Vitals:   10/07/14 1900 10/07/14 1930 10/07/14 2044 10/07/14 2300  BP: 123/66 114/64 123/69 114/67  Pulse: 70 68 57 74  Temp:      TempSrc:      Resp:   18 18  Height:      Weight:      SpO2: 100% 100% 100% 100%   Patient was seen and assessed at Urgent Care where urine was negative for pregnancy or infection.  CBC negative elevated leukocytosis. CMP unremarkable-negative elevated liver enzymes, alkaline phosphatase or bilirubin. Lipase negative elevation. Beta hCG negative elevation. RUQ US negative for gallstones - negative signs of cholecystitis.  Imaging negative for acute cholelithiasis or acute cholecystitis. Labs reassuring-negative findings of elevated liver enzymes, alkaline phosphatase of bilirubin. Pain controlled in ED setting. Patient tolerated fluids and food without difficulty - negative episodes of emesis while in the ED setting. Discussed case with Dr. Alden ServerS. Jacubowitz who agreed to plan of discharge. Suspicion to be possible muscular strain - patient does lifting for work. Cannot rule out biliary colic. Patient stable, afebrile. Patient not septic appearing. Discharged patient. Discussed with patient  to rest and stay hydrated. Discussed with patient proper diet. Discharged patient with small dose of pain medications - discussed course, precautions, disposal technique. Referred patient to PCP. Discussed with patient that if pain continues within 48  hours to report back to the ED. Discussed with patient to closely monitor symptoms and if symptoms are to worsen or change to report back to the ED - strict return instructions given.  Patient agreed to plan of care, understood, all questions answered.   Raymon Mutton, PA-C 10/07/14 2324  Doug Sou, MD 10/08/14 [phone]

## 2014-10-07 NOTE — ED Notes (Signed)
Pt from urgent care with RUQ pain.  Sent for r/o cholecystitis.  Pt reports nausea denies vomiting.  Sts pain has been present since Friday.  Sts it feels "like a knot" in her stomach.

## 2014-10-07 NOTE — ED Provider Notes (Signed)
CSN: [id number]     Arrival date & time 10/07/14  1554 History   First MD Initiated Contact with Patient 10/07/14 1729     Chief Complaint  Patient presents with  . Abdominal Pain   (Consider location/radiation/quality/duration/timing/severity/associated sxs/prior Treatment) HPI           34 year old female presents complaining of right upper quadrant abdominal pain that radiates around to her back. It has been present now for 3 days, getting worse. She has nausea without vomiting. She denies any other symptoms. She denies fever, chills, vomiting, diarrhea, constipation. She has no history of any abdominal surgeries. Pain is worse after eating. No family history of gallstones.  Past Medical History  Diagnosis Date  . Obesity   . Seasonal allergies   . Vitamin D deficiency   . Arthritis of foot, left     (see imaging 02/2014)   History reviewed. No pertinent past surgical history. Family History  Problem Relation Age of Onset  . Hypertension Sister   . Hypertension Brother   . Diabetes Mother   . Heart failure Father   . Hypertension Father   . Cancer Neg Hx   . Stroke Neg Hx    History  Substance Use Topics  . Smoking status: Former Smoker    Types: Cigarettes    Quit date: 09/01/2009  . Smokeless tobacco: Never Used  . Alcohol Use: No   OB History    Gravida Para Term Preterm AB TAB SAB Ectopic Multiple Living       Review of Systems  Constitutional: Negative for fever and appetite change.  Gastrointestinal: Positive for nausea and abdominal pain. Negative for vomiting, diarrhea and constipation.  All other systems reviewed and are negative.   Allergies  Penicillins  Home Medications   Prior to Admission medications   Medication Sig Start Date End Date Taking? Authorizing Provider  cholecalciferol (VITAMIN D) 1000 UNITS tablet Take 1,000 Units by mouth daily.    Historical Provider, MD  ergocalciferol (VITAMIN D2) 50000 UNITS capsule Take 1  capsule (50,000 Units total) by mouth once a week. 05/14/14   Joselyn Arrow, MD  fexofenadine (ALLEGRA) 180 MG tablet Take 1 tablet (180 mg total) by mouth daily. 11/08/13   Linna Hoff, MD  fluticasone (FLONASE) 50 MCG/ACT nasal spray Place 2 sprays into the nose daily. 01/25/13   Reuben Likes, MD  indomethacin (INDOCIN) 25 MG capsule Take 1 capsule (25 mg total) by mouth 3 (three) times daily as needed for mild pain or moderate pain. 03/13/14   Jess Barters H Presson, PA   BP 124/57 mmHg  Pulse 71  Temp(Src) 98.6 F (37 C) (Oral)  Resp 16  SpO2 100%  LMP 10/02/2014 Physical Exam  Constitutional: She is oriented to person, place, and time. Vital signs are normal. She appears well-developed and well-nourished. No distress.  Morbidly obese habitus  HENT:  Head: Normocephalic and atraumatic.  Pulmonary/Chest: Effort normal. No respiratory distress.  Abdominal: Soft. There is tenderness in the right upper quadrant. There is guarding and positive Murphy's sign. There is no CVA tenderness and no tenderness at McBurney's point.  Neurological: She is alert and oriented to person, place, and time. She has normal strength. Coordination normal.  Skin: Skin is warm and dry. No rash noted. She is not diaphoretic.  Psychiatric: She has a normal mood and affect. Judgment normal.  Nursing note and vitals reviewed.   ED Course  Procedures (including critical care time) Labs Review Labs Reviewed  POCT URINALYSIS DIP (DEVICE) - Abnormal; Notable for the following:    Bilirubin Urine SMALL (*)    Ketones, ur TRACE (*)    Hgb urine dipstick TRACE (*)    Leukocytes, UA TRACE (*)    All other components within normal limits  POCT PREGNANCY, URINE    Imaging Review No results found.   MDM   1. RUQ abdominal pain    Concern for cholelithiasis/cholecystitis. Transferred to the emergency department for eval of this condition.      Graylon GoodZachary H Francelia Mclaren, PA-C 10/07/14 1744

## 2014-10-07 NOTE — ED Notes (Signed)
Patient tolerating fluid. No nausea

## 2014-11-12 ENCOUNTER — Other Ambulatory Visit: Payer: Self-pay

## 2015-07-15 ENCOUNTER — Encounter (HOSPITAL_COMMUNITY): Payer: Self-pay | Admitting: Emergency Medicine

## 2015-07-15 ENCOUNTER — Emergency Department (INDEPENDENT_AMBULATORY_CARE_PROVIDER_SITE_OTHER)
Admission: EM | Admit: 2015-07-15 | Discharge: 2015-07-15 | Disposition: A | Discharge status: Home / Self Care | Payer: Managed Care, Other (non HMO) | Source: Home / Self Care | Attending: Emergency Medicine | Admitting: Emergency Medicine

## 2015-07-15 DIAGNOSIS — M7651 Patellar tendinitis, right knee: Secondary | ICD-10-CM

## 2015-07-15 DIAGNOSIS — S86811A Strain of other muscle(s) and tendon(s) at lower leg level, right leg, initial encounter: Secondary | ICD-10-CM

## 2015-07-15 DIAGNOSIS — S86911A Strain of unspecified muscle(s) and tendon(s) at lower leg level, right leg, initial encounter: Secondary | ICD-10-CM

## 2015-07-15 MED ORDER — NAPROXEN 375 MG PO TABS: 375.0000 mg | ORAL_TABLET | Freq: Two times a day (BID) | ORAL | Status: DC

## 2015-07-15 MED ORDER — DICLOFENAC SODIUM 1 % TD GEL: 1.0000 "application " | Freq: Four times a day (QID) | TRANSDERMAL | Status: DC

## 2015-07-15 NOTE — ED Notes (Signed)
C/o right knee pain onset 1 week Sx include swelling; pain increases w/activity Denies inj/trauma A&O x4... No acute distress.

## 2015-07-15 NOTE — ED Provider Notes (Signed)
CSN: [id number]     Arrival date & time 07/15/15  1715 History   First MD Initiated Contact with Patient 07/15/15 1919     Chief Complaint  Patient presents with  . Knee Pain   (Consider location/radiation/quality/duration/timing/severity/associated sxs/prior Treatment) HPI Comments: Severely obese 34 year old female complaining of right knee pain with swelling since last week. Denies any known injury. No blunt trauma. 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 This activity entails standing and twisting from one direction to another while lifting laundry and placing it from one space to another. This seems to exacerbate the pain. She states it is painful to ambulate and sometimes to bear weight.   Past Medical History  Diagnosis Date  . Obesity   . Seasonal allergies   . Vitamin D deficiency   . Arthritis of foot, left     (see imaging 02/2014)   History reviewed. No pertinent past surgical history. Family History  Problem Relation Age of Onset  . Hypertension Sister   . Hypertension Brother   . Diabetes Mother   . Heart failure Father   . Hypertension Father   . Cancer Neg Hx   . Stroke Neg Hx    Social History  Substance Use Topics  . Smoking status: Former Smoker    Types: Cigarettes    Quit date: 09/01/2009  . Smokeless tobacco: Never Used  . Alcohol Use: No   OB History    Gravida Para Term Preterm AB TAB SAB Ectopic Multiple Living   0 0 0 0 0 0 0 0 0 0      Review of Systems  Constitutional: Negative.   Respiratory: Negative.   Musculoskeletal: Positive for joint swelling and arthralgias. Negative for back pain.  Skin: Negative.   Neurological: Negative.     Allergies  Penicillins  Home Medications   Prior to Admission medications   Medication Sig Start Date End Date Taking? Authorizing Provider  acetaminophen (TYLENOL) 500 MG tablet Take 1,000 mg by mouth every 6 (six) hours as needed for moderate pain.    Historical Provider, MD   cholecalciferol (VITAMIN D) 1000 UNITS tablet Take 1,000 Units by mouth daily.    Historical Provider, MD  diclofenac sodium (VOLTAREN) 1 % GEL Apply 1 application topically 4 (four) times daily. 07/15/15   Hayden Rasmussenavid Burke Terry, NP  ergocalciferol (VITAMIN D2) 50000 UNITS capsule Take 1 capsule (50,000 Units total) by mouth once a week. 05/14/14   Joselyn ArrowEve Knapp, MD  fexofenadine (ALLEGRA) 180 MG tablet Take 1 tablet (180 mg total) by mouth daily. Patient taking differently: Take 180 mg by mouth as needed.  11/08/13   Linna HoffJames D Kindl, MD  fluticasone (FLONASE) 50 MCG/ACT nasal spray Place 2 sprays into the nose daily. Patient taking differently: Place 2 sprays into the nose as needed for allergies.  01/25/13   Reuben Likesavid C Keller, MD  HYDROcodone-acetaminophen (NORCO/VICODIN) 5-325 MG per tablet Take 1 tablet by mouth every 6 (six) hours as needed for moderate pain or severe pain. 10/07/14   Marissa Sciacca, PA-C  indomethacin (INDOCIN) 25 MG capsule Take 1 capsule (25 mg total) by mouth 3 (three) times daily as needed for mild pain or moderate pain. 03/13/14   Ria ClockJennifer Lee H Presson, PA   Meds Ordered and Administered this Visit  Medications - No data to display  BP 139/89 mmHg  Pulse 76  Temp(Src) 98.6 F (37 C) (Oral)  Resp 16  SpO2 100%  LMP 06/30/2015 No data found.  Physical Exam  Constitutional: She is oriented to person, place, and time. She appears well-developed and well-nourished. No distress.  Eyes: EOM are normal.  Neck: Normal range of motion.  Cardiovascular: Normal rate.   Pulmonary/Chest: Effort normal. No respiratory distress.  Musculoskeletal:  Right knee obese, difficult to assess. Comparing to the left knee there is little asymmetry. Tenderness and pain is primarily to the anterior knee along the patellofemoral and patellar ligament. Extension to 170, flexion to 80. No overlying erythema or discoloration. No palpable areas of edema. Unable to palate the joint spaces well. There is also  tenderness to the posterior knee along the proximal calf. No laxity appreciated. Varus and valgus stresses produce pain to the anterior knee only.  Neurological: She is alert and oriented to person, place, and time. She exhibits normal muscle tone.  Skin: Skin is warm and dry.  Psychiatric: She has a normal mood and affect.  Nursing note and vitals reviewed.   ED Course  Procedures (including critical care time)  Labs Review Labs Reviewed - No data to display  Imaging Review No results found.   Visual Acuity Review  Right Eye Distance:   Left Eye Distance:   Bilateral Distance:    Right Eye Near:   Left Eye Near:    Bilateral Near:         MDM   1. Patellar tendinitis of right knee   2. Knee strain, right, initial encounter    Rest , ice, elevation Rehab instructions Diclofenac gel Naprosyn bid prn    Hayden Rasmussen, NP 07/15/15 1945

## 2015-07-15 NOTE — Discharge Instructions (Signed)
Patellar Tendinitis With Rehab  Tendinitis is inflammation of a tendon. Tendonitis of the tendon below the kneecap (patella) is known as patellar tendonitis. Patellar tendonitis is also called jumper's knee. Jumper's knee is a common cause of pain below the kneecap (infrapatellar). Jumper's knee may involve a tear (strain) in the ligament. Strains are classified into three categories. Grade 1 strains cause pain, but the tendon is not lengthened. Grade 2 strains include a lengthened ligament, due to the ligament being stretched or partially ruptured. With grade 2 strains there is still function, although function may be decreased. Grade 3 strains involve a complete tear of the tendon or muscle, and function is usually impaired. Patellar tendon strains are usually grade 1 or 2.   SYMPTOMS   · Pain, tenderness, swelling, warmth, or redness over the patellar tendon (just below the kneecap).  · Pain and loss of strength (sometimes), with forcefully straightening the knee (especially when jumping or rising from a seated or squatting position), or bending the knee completely (squatting or kneeling).  · Crackling sound (crepitation) when the tendon is moved or touched.  CAUSES   Patellar tendonitis is caused by injury to the patellar tendon. The inflammation is the body's healing response. Common causes of injury include:  · Stress from a sudden increase in intensity, frequency, or duration of training.  · Overuse of the thigh muscles (quadriceps) and patellar tendon.  · Direct hit (trauma) to the knee or patellar tendon.  RISK INCREASES WITH:  · Sports that require sudden, explosive quadriceps contraction, such as jumping, quick starts, or kicking.  · Running sports, especially running down hills.  · Poor strength and flexibility of the thigh and knee.  · Flat feet.  PREVENTION  · Warm up and stretch properly before activity.  · Allow for adequate recovery between workouts.  · Maintain physical fitness:    Strength,  flexibility, and endurance.    Cardiovascular fitness.  · Protect the knee joint with taping, protective strapping, bracing, or elastic compression bandage.  · Wear arch supports (orthotics).  PROGNOSIS   If treated properly, patellar tendonitis usually heals within 6 weeks.   RELATED COMPLICATIONS   · Longer healing time if not properly treated or if not given enough time to heal.  · Recurring symptoms if activity is resumed too soon, with overuse, with a direct blow, or when using poor technique.  · If untreated, tendon rupture requiring surgery.  TREATMENT  Treatment first involves the use of ice and medicine to reduce pain and inflammation. The use of strengthening and stretching exercises may help reduce pain with activity. These exercises may be performed at home or with a therapist. Serious cases of tendonitis may require restraining the knee for 10 to 14 days to prevent stress on the tendon and to promote healing. Crutches may be used (uncommon) until you can walk without a limp. For cases in which nonsurgical treatment is unsuccessful, surgery may be advised to remove the inflamed tendon lining (sheath). Surgery is rare, and is only advised after at least 6 months of nonsurgical treatment.  MEDICATION   · If pain medicine is needed, nonsteroidal anti-inflammatory medicines (aspirin and ibuprofen), or other minor pain relievers (acetaminophen), are often advised.  · Do not take pain medicine for 7 days before surgery.  · Prescription pain relievers may be given if your caregiver thinks they are needed. Use only as directed and only as much as you need.  HEAT AND COLD  · Cold treatment (icing)   should be applied for 10 to 15 minutes every 2 to 3 hours for inflammation and pain, and immediately after activity that aggravates your symptoms. Use ice packs or an ice massage.  · Heat treatment may be used before performing stretching and strengthening activities prescribed by your caregiver, physical therapist, or  athletic trainer. Use a heat pack or a warm water soak.  SEEK MEDICAL CARE IF:  · Symptoms get worse or do not improve in 2 weeks, despite treatment.  · New, unexplained symptoms develop. (Drugs used in treatment may produce side effects.)  EXERCISES  RANGE OF MOTION (ROM) AND STRETCHING EXERCISES - Patellar Tendinitis (Jumper's Knee)  These are some of the initial exercises with which you may start your rehabilitation program, until you see your caregiver again or until your symptoms are resolved. Remember:   · Flexible tissue is more tolerant of the stresses placed on it during activities.  · Each stretch should be held for 20 to 30 seconds.  · A gentle stretching sensation should be felt.  STRETCH - Hamstrings, Supine  · Lie on your back. Loop a belt or towel over the ball of your right / left foot.  · Straighten your right / left knee and slowly pull on the belt to raise your leg. Do not allow the right / left knee to bend. Keep your opposite leg flat on the floor.  · Raise the leg until you feel a gentle stretch behind your right / left knee or thigh. Hold this position for __________ seconds.  Repeat __________ times. Complete this stretch __________ times per day.   STRETCH - Hamstrings, Doorway  · Lie on your back with your right / left leg extended and resting on the wall, and the opposite leg flat on the ground through the door. At first, position your bottom farther away from the wall.  · Keep your right / left knee straight. If you feel a stretch behind your knee or thigh, hold this position for __________ seconds.  · If you do not feel a stretch, scoot your bottom closer to the door, and hold __________ seconds.  Repeat __________ times. Complete this stretch __________ times per day.   STRETCH - Hamstrings, Standing  · Stand or sit and extend your right / left leg, placing your foot on a chair or foot stool.  · Keep a slight arch in your low back and your hips straight forward.  · Lead with your chest  and lean forward at the waist until you feel a gentle stretch in the back of your right / left knee or thigh. (When done correctly, this exercise requires leaning only a small distance.)  · Hold this position for __________ seconds.  Repeat __________ times. Complete this stretch __________ times per day.  STRETCH - Adductors, Lunge  · While standing, spread your legs, with your right / left leg behind you.  · Lean away from your right / left leg by bending your opposite knee. You may rest your hands on your thigh for balance.  · You should feel a stretch in your right / left inner thigh. Hold for __________ seconds.  Repeat __________ times. Complete this exercise __________ times per day.   STRENGTHENING EXERCISES - Patellar Tendinitis (Jumper's Knee)  These exercises may help you when beginning to rehabilitate your injury. They may resolve your symptoms with or without further involvement from your physician, physical therapist or athletic trainer. While completing these exercises, remember:   · Muscles   can gain both the endurance and the strength needed for everyday activities through controlled exercises.  · Complete these exercises as instructed by your physician, physical therapist or athletic trainer. Increase the resistance and repetitions only as guided by your caregiver.  STRENGTH - Quadriceps, Isometrics  · Lie on your back with your right / left leg extended and your opposite knee bent.  · Gradually tense the muscles in the front of your right / left thigh. You should see either your kneecap slide up toward your hip or increased dimpling just above the knee. This motion will push the back of the knee down toward the floor, mat, or bed on which you are lying.  · Hold the muscle as tight as you can, without increasing your pain, for __________ seconds.  · Relax the muscles slowly and completely in between each repetition.  Repeat __________ times. Complete this exercise __________ times per day.      STRENGTH - Quadriceps, Short Arcs  · Lie on your back. Place a __________ inch towel roll under your right / left knee, so that the knee bends slightly.  · Raise only your lower leg by tightening the muscles in the front of your thigh. Do not allow your thigh to rise.  · Hold this position for __________ seconds.  Repeat __________ times. Complete this exercise __________ times per day.   OPTIONAL ANKLE WEIGHTS: Begin with ____________________, but DO NOT exceed ____________________. Increase in 1 pound/ 0.5 kilogram increments.  STRENGTH - Quadriceps, Straight Leg Raises   Quality counts! Watch for signs that the quadriceps muscle is working, to be sure you are strengthening the correct muscles and not "cheating" by substituting with healthier muscles.  · Lay on your back with your right / left leg extended and your opposite knee bent.  · Tense the muscles in the front of your right / left thigh. You should see either your kneecap slide up or increased dimpling just above the knee. Your thigh may even shake a bit.  · Tighten these muscles even more and raise your leg 4 to 6 inches off the floor. Hold for __________ seconds.  · Keeping these muscles tense, lower your leg.  · Relax the muscles slowly and completely between each repetition.  Repeat __________ times. Complete this exercise __________ times per day.   STRENGTH - Quadriceps, Squats  · Stand in a door frame so that your feet and knees are in line with the frame.  · Use your hands for balance, not support, on the frame.  · Slowly lower your weight, bending at the hips and knees. Keep your lower legs upright so that they are parallel with the door frame. Squat only within the range that does not increase your knee pain. Never let your hips drop below your knees.  · Slowly return upright, pushing with your legs, not pulling with your hands.  Repeat __________ times. Complete this exercise __________ times per day.   STRENGTH - Quadriceps,  Step-Downs  · Stand on the edge of a step stool or stair. Be prepared to use a countertop or wall for balance, if needed.  · Keeping your right / left knee directly over the middle of your foot, slowly touch your opposite heel to the floor or lower step. Do not go all the way to the floor if your knee pain increases; just go as far as you can without increased discomfort. Use your right / left leg muscles, not gravity to lower your   body weight.  · Slowly push your body weight back up to the starting position.  Repeat __________ times. Complete this exercise __________ times per day.      This information is not intended to replace advice given to you by your health care provider. Make sure you discuss any questions you have with your health care provider.     Document Released: 08/09/2005 Document Revised: 12/24/2014 Document Reviewed: 11/21/2008  Elsevier Interactive Patient Education ©2016 [employer]

## 2015-09-17 IMAGING — US US ABDOMEN LIMITED
1 series · 14 of 25 positions shown · non-contrast
Comparison: None.

CLINICAL DATA: Right upper quadrant pain for 4 days.

EXAM:
US ABDOMEN LIMITED - RIGHT UPPER QUADRANT

[Series 1: us abdomen limited · 0.24mm/px · 14 of 50 slices shown]
[im 1/50]
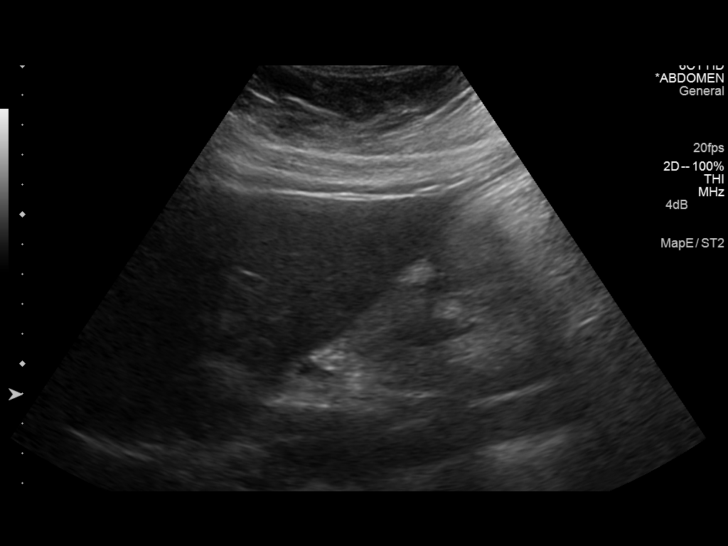
[im 5/50]
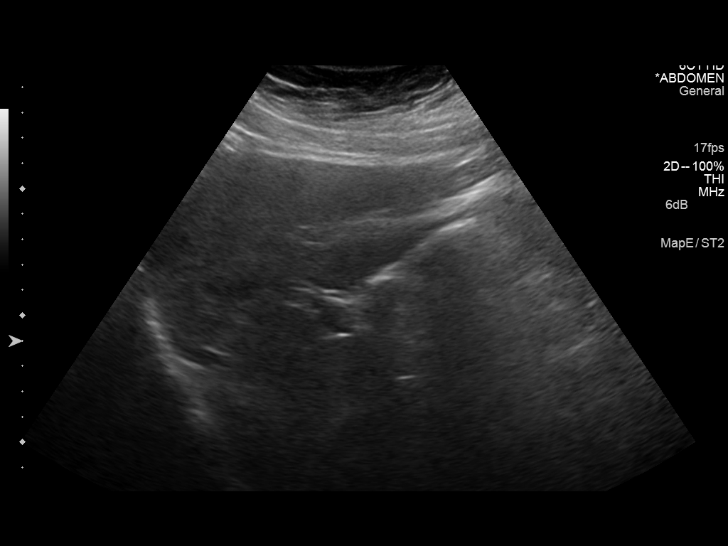
[im 9/50]
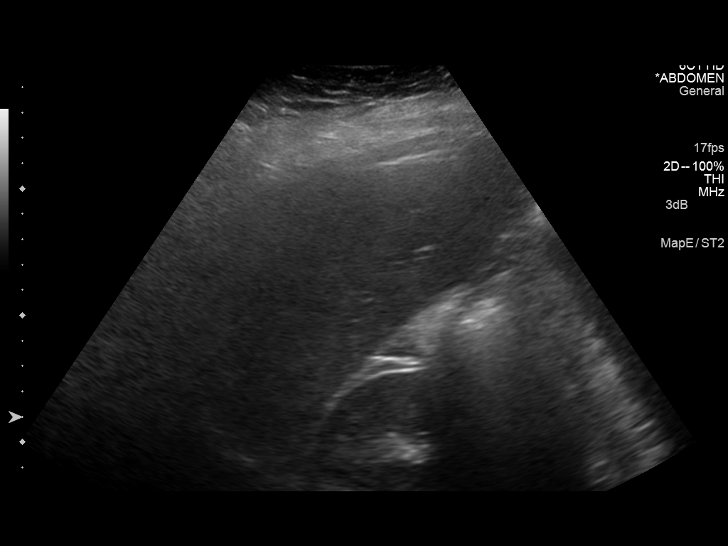
[im 13/50]
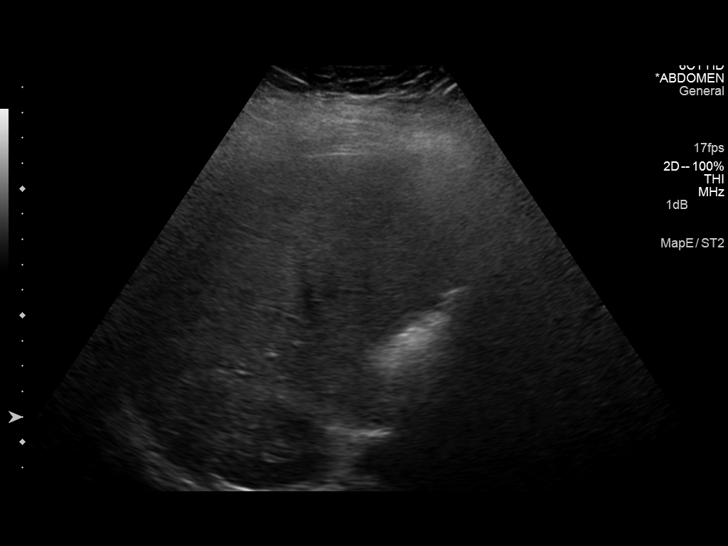
[im 17/50]
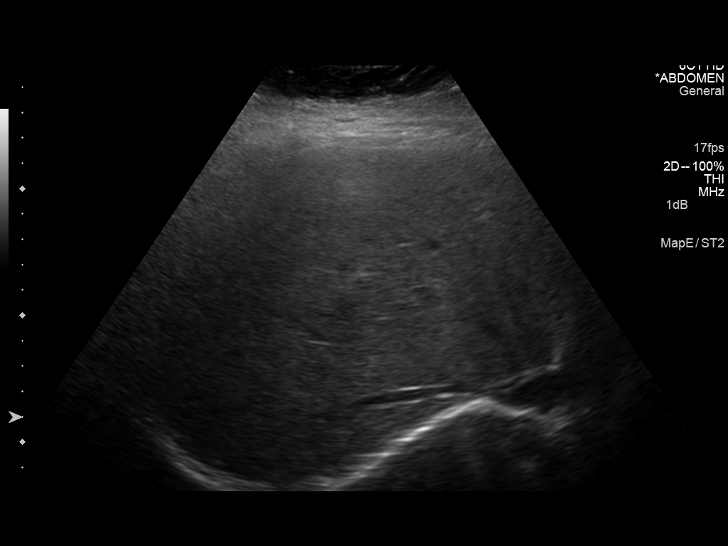
[im 19/50]
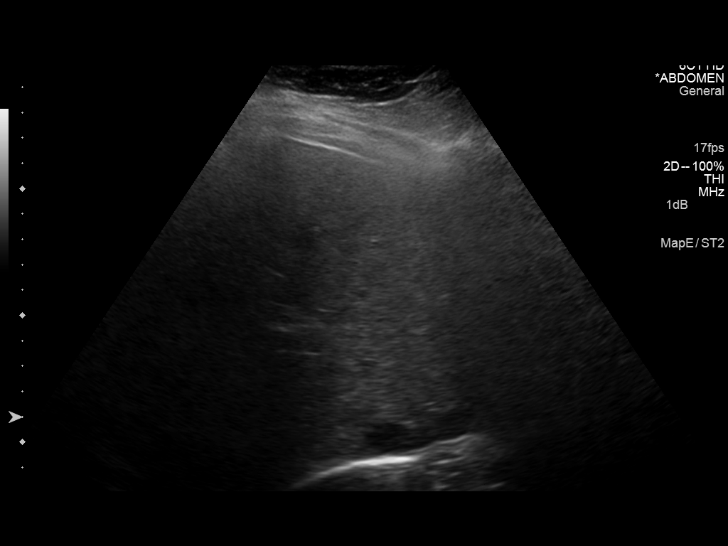
[im 23/50]
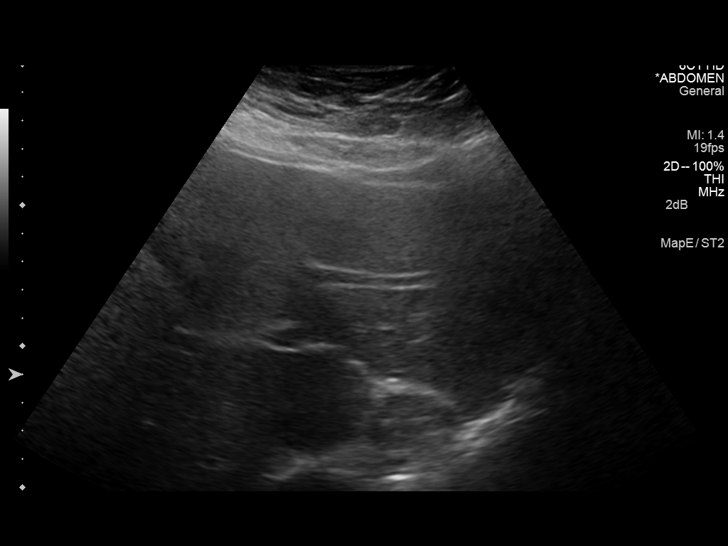
[im 27/50]
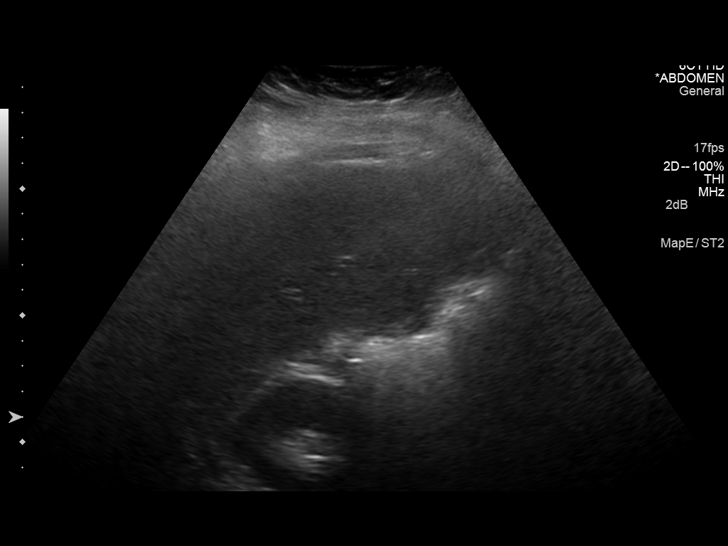
[im 31/50]
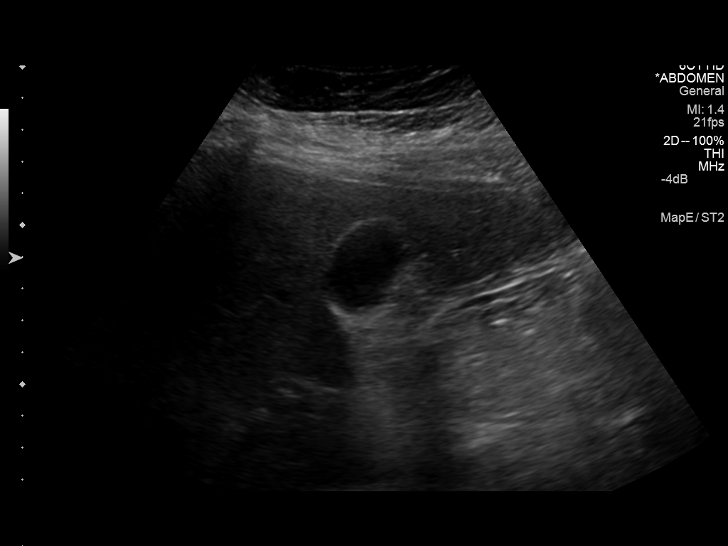
[im 33/50]
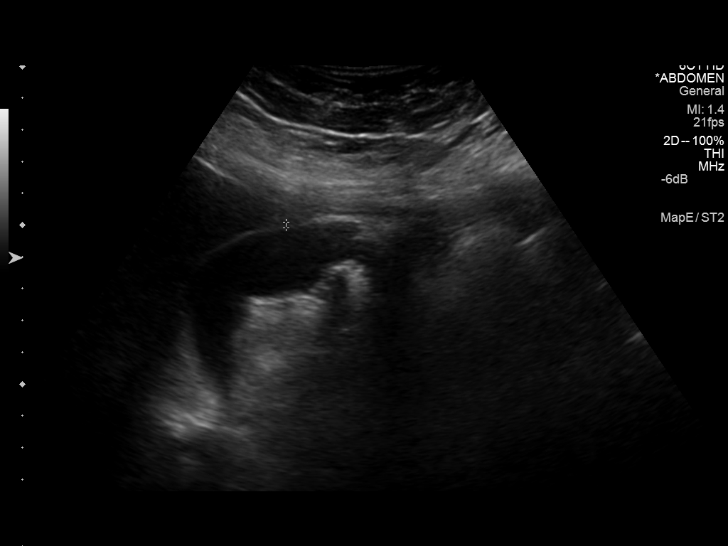
[im 37/50]
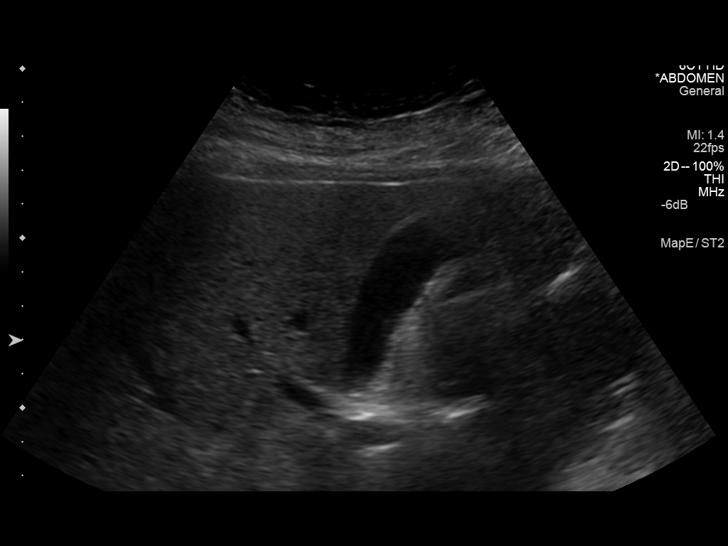
[im 41/50]
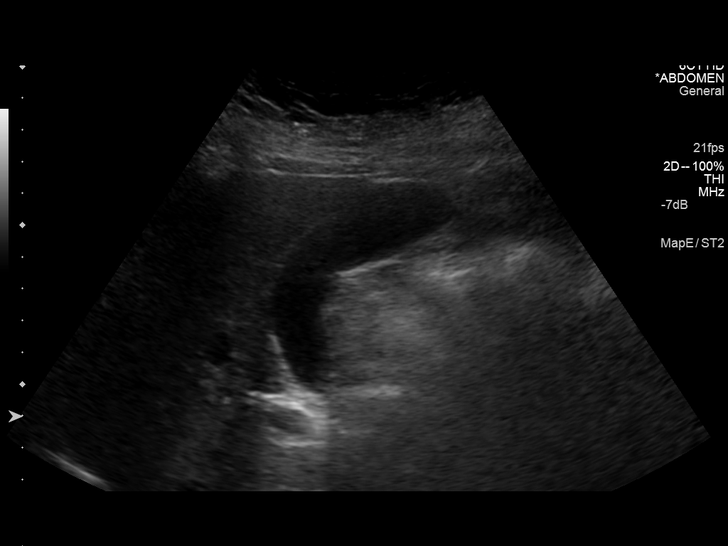
[im 45/50]
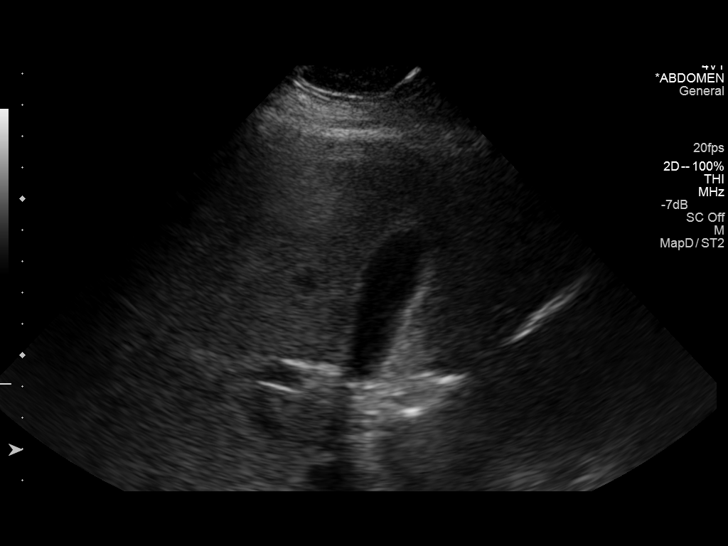
[im 50/50]
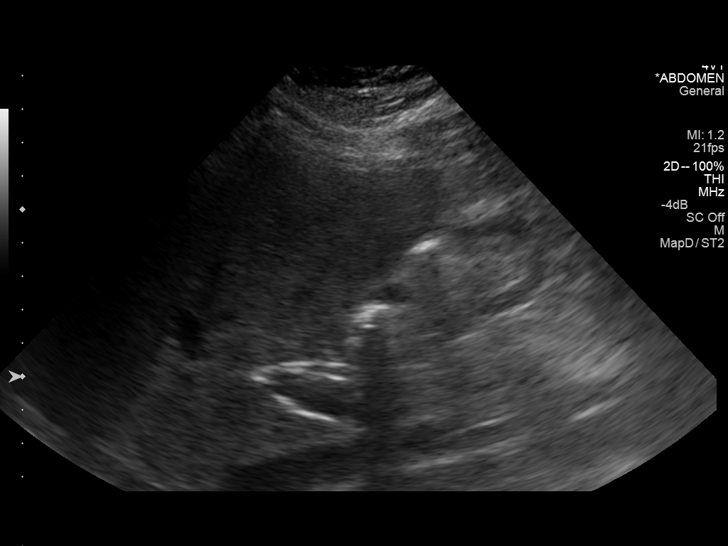

[14 of 25 positions shown; findings below may reference images not displayed]

FINDINGS: Gallbladder:

No gallstones or wall thickening visualized. No sonographic Murphy
sign noted.

Common bile duct:

Diameter: 0.3 cm

Liver:

No focal lesion identified. Within normal limits in parenchymal
echogenicity.
IMPRESSION: Negative for gallstones.  Negative exam.

## 2015-10-13 ENCOUNTER — Encounter: Payer: Self-pay | Admitting: Family Medicine

## 2015-10-13 ENCOUNTER — Ambulatory Visit (INDEPENDENT_AMBULATORY_CARE_PROVIDER_SITE_OTHER): Payer: Managed Care, Other (non HMO) | Admitting: Family Medicine

## 2015-10-13 VITALS — BP 120/74 | HR 76 | Ht 64.0 in | Wt 365.2 lb

## 2015-10-13 DIAGNOSIS — N644 Mastodynia: Secondary | ICD-10-CM

## 2015-10-13 DIAGNOSIS — E559 Vitamin D deficiency, unspecified: Secondary | ICD-10-CM | POA: Diagnosis not present

## 2015-10-13 DIAGNOSIS — R5383 Other fatigue: Secondary | ICD-10-CM | POA: Diagnosis not present

## 2015-10-13 DIAGNOSIS — N946 Dysmenorrhea, unspecified: Secondary | ICD-10-CM

## 2015-10-13 DIAGNOSIS — R635 Abnormal weight gain: Secondary | ICD-10-CM

## 2015-10-13 DIAGNOSIS — O26851 Spotting complicating pregnancy, first trimester: Secondary | ICD-10-CM | POA: Diagnosis not present

## 2015-10-13 DIAGNOSIS — Z0000 Encounter for general adult medical examination without abnormal findings: Secondary | ICD-10-CM | POA: Diagnosis not present

## 2015-10-13 DIAGNOSIS — Z3490 Encounter for supervision of normal pregnancy, unspecified, unspecified trimester: Secondary | ICD-10-CM

## 2015-10-13 DIAGNOSIS — N926 Irregular menstruation, unspecified: Secondary | ICD-10-CM | POA: Diagnosis not present

## 2015-10-13 LAB — CBC WITH DIFFERENTIAL/PLATELET
Basophils Absolute: 0 10*3/uL (ref 0.0–0.1)
Basophils Relative: 1 % (ref 0–1)
Eosinophils Absolute: 0 10*3/uL (ref 0.0–0.7)
Eosinophils Relative: 0 % (ref 0–5)
HEMATOCRIT: 38.6 % (ref 36.0–46.0)
HEMOGLOBIN: 13.1 g/dL (ref 12.0–15.0)
Lymphocytes Relative: 43 % (ref 12–46)
Lymphs Abs: 1.8 10*3/uL (ref 0.7–4.0)
MCH: 23.9 pg — AB (ref 26.0–34.0)
MCHC: 33.9 g/dL (ref 30.0–36.0)
MCV: 70.3 fL — ABNORMAL LOW (ref 78.0–100.0)
MPV: 9.9 fL (ref 8.6–12.4)
Monocytes Absolute: 0.4 10*3/uL (ref 0.1–1.0)
Monocytes Relative: 10 % (ref 3–12)
NEUTROS ABS: 1.9 10*3/uL (ref 1.7–7.7)
NEUTROS PCT: 46 % (ref 43–77)
Platelets: 259 10*3/uL (ref 150–400)
RBC: 5.49 MIL/uL — ABNORMAL HIGH (ref 3.87–5.11)
RDW: 17 % — ABNORMAL HIGH (ref 11.5–15.5)
WBC: 4.2 10*3/uL (ref 4.0–10.5)

## 2015-10-13 LAB — POCT URINALYSIS DIPSTICK
BILIRUBIN UA: NEGATIVE
Glucose, UA: NEGATIVE
Ketones, UA: NEGATIVE
Nitrite, UA: NEGATIVE
UROBILINOGEN UA: NEGATIVE
pH, UA: 5.5

## 2015-10-13 LAB — POCT URINE PREGNANCY: Preg Test, Ur: POSITIVE — AB

## 2015-10-13 NOTE — Progress Notes (Signed)
Chief Complaint  Patient presents with  . Annual Exam    fasting annual exam with pap. UA showed 2+ leuks, trace protein and 3+ blood, Patient did ask me to perform a urine pregnancy because her period is late and also having breast tenderness. No concerns.    Marissa Lowe is a 35 y.o. female who presents for a complete physical.  She has the following concerns:  She is asking for a pregnancy test, due to not having period since December, having breast tenderness. Last period was the end of December.  She starting having a little spotting since last week, only minimal cramping, and increasing breast tenderness since last week.  She is in a monogamous relationship x 3 years, usually uses condoms, but recalls unprotected intercourse.  She has a history of vitamin D deficiency. Vitamin D level in 04/2014 was 18. She was treated with rx x 12 weeks, but didn't return for recheck. She has not been taking supplement or vitamins.  She has been having congestion over the last few days--nasal congestion, postnasal drainage and cough.  She has h/o allergies, previously treated with flonase and Allegra. Hasn't taken anything recently.  Denies discolored mucus or fever.   Immunization History  Administered Date(s) Administered  . Influenza,inj,Quad PF,36+ Mos 05/09/2014  . Influenza-Unspecified 05/24/2015  . Tdap 03/01/2013  h/o varicella in the past Last Pap smear: 02/2013--normal, no high risk HPV (and neg GC/chlamydia) Last mammogram: never  Last colonoscopy: never  Last DEXA: never  Dentist: twice yearly  Ophtho: never  Exercise: Walks 30 minutes after work, plus is on her feet all day at work (constantly moving)  c-met from [facility] visit 09/2014 normal. Lab Results  Component Value Date   WBC 8.1 10/07/2014   HGB 12.3 10/07/2014   HCT 35.3* 10/07/2014   MCV 68.8* 10/07/2014   PLT 296 10/07/2014   Past Medical History  Diagnosis Date  . Obesity   . Seasonal allergies   . Vitamin D  deficiency   . Arthritis of foot, left     (see imaging 02/2014)    History reviewed. No pertinent past surgical history.  Social History   Social History  . Marital Status: Single    Spouse Name: N/A  . Number of Children: N/A  . Years of Education: N/A   Occupational History  . works in Medical sales representative room at [employer]  . Smoking status: Former Smoker    Types: Cigarettes    Quit date: 09/01/2009  . Smokeless tobacco: Never Used  . Alcohol Use: No  . Drug Use: No  . Sexual Activity:    Partners: Male    Birth Control/ Protection: Condom   Other Topics Concern  . Not on file   Social History Narrative   Lives with her sister.  No tobacco exposure.   In monogamous relationship x 3 years    Family History  Problem Relation Age of Onset  . Hypertension Sister   . Hypertension Brother   . Diabetes Mother   . Heart failure Father   . Hypertension Father   . Cancer Neg Hx   . Stroke Neg Hx   . Glaucoma Brother     Outpatient Encounter Prescriptions as of 10/13/2015  Medication Sig Note  . cholecalciferol (VITAMIN D) 1000 UNITS tablet Take 1,000 Units by mouth daily.   . diclofenac sodium (VOLTAREN) 1 % GEL Apply 1 application topically 4 (four) times daily. 10/13/2015: Uses prn knee pain (  rx'd from [facility])  . acetaminophen (TYLENOL) 500 MG tablet Take 1,000 mg by mouth every 6 (six) hours as needed for moderate pain. Reported on 10/13/2015   . fexofenadine (ALLEGRA) 180 MG tablet Take 1 tablet (180 mg total) by mouth daily. (Patient not taking: Reported on 10/13/2015) 05/09/2014: Uses prn, not recently  . fluticasone (FLONASE) 50 MCG/ACT nasal spray Place 2 sprays into the nose daily. (Patient not taking: Reported on 10/13/2015)   . [DISCONTINUED] ergocalciferol (VITAMIN D2) 50000 UNITS capsule Take 1 capsule (50,000 Units total) by mouth once a week.   . [DISCONTINUED] HYDROcodone-acetaminophen (NORCO/VICODIN) 5-325 MG per tablet Take 1 tablet by  mouth every 6 (six) hours as needed for moderate pain or severe pain.   . [DISCONTINUED] indomethacin (INDOCIN) 25 MG capsule Take 1 capsule (25 mg total) by mouth 3 (three) times daily as needed for mild pain or moderate pain. (Patient not taking: Reported on 10/13/2015) 05/09/2014: Uses prn--not currently (for arthritis in left foot)  . [DISCONTINUED] naproxen (NAPROSYN) 375 MG tablet Take 1 tablet (375 mg total) by mouth 2 (two) times daily.    No facility-administered encounter medications on file as of 10/13/2015.    Allergies  Allergen Reactions  . Penicillins Hives    ROS: The patient denies anorexia, fever, vision changes, decreased hearing, ear pain, sore throat, breast concerns, chest pain, palpitations, lightheadedness, syncope, dyspnea on exertion, cough, swelling, nausea, vomiting, diarrhea, constipation, abdominal pain, melena, hematochezia, indigestion/heartburn, hematuria, incontinence, dysuria, vaginal discharge, odor or itch, genital lesions,numbness, tingling, weakness, tremor, suspicious skin lesions, depression, anxiety, abnormal bleeding/bruising, or enlarged lymph nodes.  Occasional right knee pain. +breast tenderness, irregular menses (late). +30# weight gain since her last visit here 04/2014 No period since end of December. +URI/congestion as per HPI   PHYSICAL EXAM:  BP 140/90 mmHg  Pulse 76  Ht _0  (1.626 m)  Wt 365 lb 3.2 oz (165.654 kg)  BMI 62.66 kg/m2  LMP 08/21/2015 120/74 on repeat by MD  General Appearance:  Alert, cooperative, no distress, appears stated age, morbidly obese   Head:  Normocephalic, without obvious abnormality, atraumatic. Mild maxillary sinus tenderness bilaterally  Eyes:  PERRL, conjunctiva/corneas clear, EOM's intact, fundi  Benign; possible small cataract noted on the left  Ears:  Normal TM's and external ear canals; small effusion on the left  Nose:  Nares normal, mucosa is moderately edematous, L>R, no purulence or  erythema  Throat:  Lips and tongue normal; teeth and gums normal.   Neck:  Supple, no lymphadenopathy; thyroid: no enlargement/tenderness/nodules; no carotid  bruit or JVD   Back:  Spine nontender, no curvature, ROM normal, no CVA tenderness   Lungs:  Clear to auscultation bilaterally without wheezes, rales or ronchi; respirations unlabored   Chest Wall:  No tenderness or deformity   Heart:  Regular rate and rhythm, S1 and S2 normal, no murmur, rub  or gallop   Breast Exam:  No tenderness, masses, or nipple discharge or inversion. No axillary lymphadenopathy   Abdomen:  Soft, non-tender, nondistended, normoactive bowel sounds, no masses, no hepatosplenomegaly. obese   Genitalia:  Deferred to OB-GYN  Rectal:  Not performed due to age<40 and no related complaints   Extremities:  No clubbing, cyanosis or edema.   Pulses:  2+ and symmetric all extremities   Skin:  Skin color, texture, turgor normal, no rashes or lesions. +tatoos   Lymph nodes:  Cervical, supraclavicular, and axillary nodes normal   Neurologic:  CNII-XII intact, normal strength, sensation and gait;  reflexes 2+ and symmetric throughout    Psych:  Normal mood, affect, hygiene and grooming.         Urine pregnancy test + Urine dip: 3+ blood, 2+ leuks, trace protein, SG >=1.030 (some vaginal spotting).    ASSESSMENT/PLAN:  Annual physical exam - Plan: Visual acuity screening, POCT Urinalysis Dipstick, VITAMIN D 25 Hydroxy (Vit-D Deficiency, Fractures), CBC with Differential/Platelet, Comprehensive metabolic panel, TSH, Lipid panel  Breast tenderness - due to pregnancy - Plan: POCT urine pregnancy  Missed period - Plan: POCT urine pregnancy  Weight gain - Plan: TSH  Vitamin D deficiency - Plan: VITAMIN D 25 Hydroxy (Vit-D Deficiency, Fractures)  Spotting complicating pregnancy in first trimester - Plan: CBC with Differential/Platelet, hCG, quantitative,  pregnancy, Ambulatory referral to Obstetrics / Gynecology  Other fatigue - Plan: CBC with Differential/Platelet, Comprehensive metabolic panel, TSH  Pregnancy - refer to OB. start PNV. f/u with GYN if increasing bleeding, cramping - Plan: Ambulatory referral to Obstetrics / Gynecology   Quant HCG Vitamin D c-met, CBC, TSH, lipid Will let OB do other routine labs (ie rubella, HIV, Hep studies, etc).  Discussed monthly self breast exams and yearly mammograms after the age of 76; at least 30 minutes of aerobic activity at least 5 days/week; proper sunscreen use reviewed; healthy diet, including goals of calcium and vitamin D intake and alcohol recommendations (less than or equal to 1 drink/day) reviewed; regular seatbelt use; changing batteries in smoke detectors. Immunization recommendations discussed, UTD; advised she will get another Tdap during pregnancy from OB. Colonoscopy recommendations reviewed--age 60  Routine eye exam is recommended (vision was 20/50 in right eye  Pregnancy.  EGA 7+4; [facility] 05/27/16  Using your last menstrual period date of 08/21/15, your due date is 05/27/2016.  This could change based on ultrasounds (in case the date wasn't exactly accurate).  Start taking a prenatal vitamin daily. Use claritin or zyrtec if needed for seasonal allergies (hold off on using the nasal spray or allegra). Continue your vitamin D supplement daily. We are referring you to an OB-GYN for your pregnancy. Pap smear/pelvic exam was NOT done today, since referring you to OB-GYN for pregnancy care.  Allergies vs URI Supportive measures

## 2015-10-13 NOTE — Patient Instructions (Addendum)
  HEALTH MAINTENANCE RECOMMENDATIONS:  It is recommended that you get at least 30 minutes of aerobic exercise at least 5 days/week (for weight loss, you may need as much as 60-90 minutes). This can be any activity that gets your heart rate up. This can be divided in 10-15 minute intervals if needed, but try and build up your endurance at least once a week.  Weight bearing exercise is also recommended twice weekly.  Eat a healthy diet with lots of vegetables, fruits and fiber.  "Colorful" foods have a lot of vitamins (ie green vegetables, tomatoes, red peppers, etc).  Limit sweet tea, regular sodas and alcoholic beverages, all of which has a lot of calories and sugar.  Up to 1 alcoholic drink daily may be beneficial for women (unless trying to lose weight, watch sugars).  Drink a lot of water.  Calcium recommendations are 1200-1500 mg daily (1500 mg for postmenopausal women or women without ovaries), and vitamin D 1000 IU daily.  This should be obtained from diet and/or supplements (vitamins), and calcium should not be taken all at once, but in divided doses.  Monthly self breast exams and yearly mammograms for women over the age of 73 is recommended.  Sunscreen of at least SPF 30 should be used on all sun-exposed parts of the skin when outside between the hours of 10 am and 4 pm (not just when at beach or pool, but even with exercise, golf, tennis, and yard work!)  Use a sunscreen that says "broad spectrum" so it covers both UVA and UVB rays, and make sure to reapply every 1-2 hours.  Remember to change the batteries in your smoke detectors when changing your clock times in the spring and fall.  Use your seat belt every time you are in a car, and please drive safely and not be distracted with cell phones and texting while driving.  Using your last menstrual period date of 08/21/15, your due date is 05/27/2016.  This could change based on ultrasounds (in case the date wasn't exactly accurate).  Start  taking a prenatal vitamin daily. Use claritin or zyrtec if needed for seasonal allergies (hold off on using the nasal spray or allegra). Continue your vitamin D supplement daily. We are referring you to an OB-GYN for your pregnancy. Pap smear/pelvic exam was NOT done today, since referring you to OB-GYN for pregnancy care.  Using your last menstrual period date of 08/21/15, your due date is 05/27/2016.  This could change based on ultrasounds (in case the date wasn't exactly accurate).  Start taking a prenatal vitamin daily. Use claritin or zyrtec if needed for seasonal allergies (hold off on using the nasal spray or allegra). Continue your vitamin D supplement daily. We are referring you to an OB-GYN for your pregnancy. Pap smear/pelvic exam was NOT done today, since referring you to OB-GYN for pregnancy care.  It appears you may have a cold/virus, not just allergies. Use the claritin, but you may also try sinus rinses or Neti-pot to help with sinus pressure. You may use sudafed sparingly, just if needed for sinus or ear pain. Drink plenty of fluids.   Use tylenol if needed for pain, fever (rather than ibuprofen/aleve)

## 2015-10-14 LAB — COMPREHENSIVE METABOLIC PANEL
ALBUMIN: 3.9 g/dL (ref 3.6–5.1)
ALT: 12 U/L (ref 6–29)
AST: 11 U/L (ref 10–30)
Alkaline Phosphatase: 57 U/L (ref 33–115)
BUN: 7 mg/dL (ref 7–25)
CALCIUM: 9.1 mg/dL (ref 8.6–10.2)
CHLORIDE: 100 mmol/L (ref 98–110)
CO2: 23 mmol/L (ref 20–31)
Creat: 0.52 mg/dL (ref 0.50–1.10)
Glucose, Bld: 84 mg/dL (ref 65–99)
POTASSIUM: 3.6 mmol/L (ref 3.5–5.3)
Sodium: 135 mmol/L (ref 135–146)
TOTAL PROTEIN: 7 g/dL (ref 6.1–8.1)
Total Bilirubin: 0.3 mg/dL (ref 0.2–1.2)

## 2015-10-14 LAB — TSH: TSH: 3.97 m[IU]/L

## 2015-10-14 LAB — LIPID PANEL
CHOL/HDL RATIO: 2.8 ratio (ref <=5.0)
CHOLESTEROL: 178 mg/dL (ref 125–200)
HDL: 63 mg/dL (ref >=46)
LDL Cholesterol: 100 mg/dL (ref <130)
Triglycerides: 75 mg/dL (ref <150)
VLDL: 15 mg/dL (ref <30)

## 2015-10-14 LAB — HCG, QUANTITATIVE, PREGNANCY: hCG, Beta Chain, Quant, S: 95339.8 m[IU]/mL — ABNORMAL HIGH

## 2015-10-14 LAB — VITAMIN D 25 HYDROXY (VIT D DEFICIENCY, FRACTURES): Vit D, 25-Hydroxy: 11 ng/mL — ABNORMAL LOW (ref 30–100)

## 2015-10-22 DIAGNOSIS — A5901 Trichomonal vulvovaginitis: Secondary | ICD-10-CM

## 2015-10-22 HISTORY — DX: Trichomonal vulvovaginitis: A59.01

## 2015-10-24 ENCOUNTER — Ambulatory Visit (INDEPENDENT_AMBULATORY_CARE_PROVIDER_SITE_OTHER): Payer: Managed Care, Other (non HMO) | Admitting: Certified Nurse Midwife

## 2015-10-24 ENCOUNTER — Encounter: Payer: Self-pay | Admitting: Certified Nurse Midwife

## 2015-10-24 VITALS — BP 126/87 | HR 67 | Temp 98.5°F | Wt 376.0 lb

## 2015-10-24 DIAGNOSIS — Z3401 Encounter for supervision of normal first pregnancy, first trimester: Secondary | ICD-10-CM | POA: Diagnosis not present

## 2015-10-24 LAB — POCT URINALYSIS DIPSTICK
Bilirubin, UA: NEGATIVE
Glucose, UA: NEGATIVE
KETONES UA: NEGATIVE
Leukocytes, UA: NEGATIVE
Nitrite, UA: NEGATIVE
Protein, UA: NEGATIVE
RBC UA: NEGATIVE
Spec Grav, UA: 1.02
UROBILINOGEN UA: NEGATIVE
pH, UA: 6

## 2015-10-24 LAB — OB RESULTS CONSOLE GBS: GBS: POSITIVE

## 2015-10-24 MED ORDER — CITRANATAL RX 27-1 MG PO TABS: 1.0000 | ORAL_TABLET | Freq: Every day | ORAL | Status: DC

## 2015-10-24 NOTE — Progress Notes (Signed)
Subjective:    Marissa Lowe is being seen today for her first obstetrical visit.  This is not a planned pregnancy. She is at 5736w1d gestation. Her obstetrical history is significant for obesity. Relationship with FOB: significant other, living together. Patient does intend to breast feed. Pregnancy history fully reviewed.  The information documented in the HPI was reviewed and verified.  Menstrual History: OB History    Gravida Para Term Preterm AB TAB SAB Ectopic Multiple Living   1 0 0 0 0 0 0 0 0 0       Menarche age: 35 years of age  Patient's last menstrual period was 08/21/2015.    Past Medical History  Diagnosis Date  . Obesity   . Seasonal allergies   . Vitamin D deficiency   . Arthritis of foot, left     (see imaging 02/2014)    History reviewed. No pertinent past surgical history.   (Not in a hospital admission) Allergies  Allergen Reactions  . Penicillins Hives    Social History  Substance Use Topics  . Smoking status: Former Smoker    Types: Cigarettes    Quit date: 09/01/2009  . Smokeless tobacco: Never Used  . Alcohol Use: No    Family History  Problem Relation Age of Onset  . Hypertension Sister   . Hypertension Brother   . Diabetes Mother   . Heart failure Father   . Hypertension Father   . Cancer Neg Hx   . Stroke Neg Hx   . Glaucoma Brother      Review of Systems Constitutional: negative for weight loss Gastrointestinal: negative for vomiting Genitourinary:negative for genital lesions and vaginal discharge and dysuria Musculoskeletal:negative for back pain Behavioral/Psych: negative for abusive relationship, depression, illegal drug usage and tobacco use    Objective:    BP 126/87 mmHg  Pulse 67  Temp(Src) 98.5 F (36.9 C)  Wt 376 lb (170.552 kg)  LMP 08/21/2015 General Appearance:    Alert, cooperative, no distress, appears stated age  Head:    Normocephalic, without obvious abnormality, atraumatic  Eyes:    PERRL, conjunctiva/corneas  clear, EOM's intact, fundi    benign, both eyes  Ears:    Normal TM's and external ear canals, both ears  Nose:   Nares normal, septum midline, mucosa normal, no drainage    or sinus tenderness  Throat:   Lips, mucosa, and tongue normal; teeth and gums normal  Neck:   Supple, symmetrical, trachea midline, no adenopathy;    thyroid:  no enlargement/tenderness/nodules; no carotid   bruit or JVD  Back:     Symmetric, no curvature, ROM normal, no CVA tenderness  Lungs:     Clear to auscultation bilaterally, respirations unlabored  Chest Wall:    No tenderness or deformity   Heart:    Regular rate and rhythm, S1 and S2 normal, no murmur, rub   or gallop  Breast Exam:    No tenderness, masses, or nipple abnormality  Abdomen:     Soft, non-tender, bowel sounds active all four quadrants,    no masses, no organomegaly  Genitalia:    Normal female without lesion, discharge or tenderness  Extremities:   Extremities normal, atraumatic, no cyanosis or edema  Pulses:   2+ and symmetric all extremities  Skin:   Skin color, texture, turgor normal, no rashes or lesions  Lymph nodes:   Cervical, supraclavicular, and axillary nodes normal  Neurologic:   CNII-XII intact, normal strength, sensation and reflexes  throughout                             Cervix: long, thick, closed, and posterior.      Lab Review Urine pregnancy test Labs reviewed yes Radiologic studies reviewed no Assessment:    Pregnancy at [redacted]w[redacted]d weeks   Morbid obesity  Plan:    Prenatal vitamins.  Counseling provided regarding continued use of seat belts, cessation of alcohol consumption, smoking or use of illicit drugs; infection precautions i.e., influenza/TDAP immunizations, toxoplasmosis,CMV, parvovirus, listeria and varicella; workplace safety, exercise during pregnancy; routine dental care, safe medications, sexual activity, hot tubs, saunas, pools, travel, caffeine use, fish and methlymercury, potential toxins, hair treatments,  varicose veins Weight gain recommendations per IOM guidelines reviewed: underweight/BMI< 18.5--> gain 28 - 40 lbs; normal weight/BMI 18.5 - 24.9--> gain 25 - 35 lbs; overweight/BMI 25 - 29.9--> gain 15 - 25 lbs; obese/BMI >30->gain  11 - 20 lbs Problem list reviewed and updated. FIRST/CF mutation testing/NIPT/QUAD SCREEN/fragile X/Ashkenazi Jewish population testing/Spinal muscular atrophy discussed: requested. Role of ultrasound in pregnancy discussed; fetal survey: requested. Amniocentesis discussed: not indicated. VBAC calculator score: VBAC consent form provided Meds ordered this encounter  Medications  . Prenatal Vit-Fe Fumarate-FA (PRENATAL VITAMINS PLUS PO)    Sig: Take by mouth.   Orders Placed This Encounter  Procedures  . SureSwab, Vaginosis/Vaginitis Plus  . Culture, OB Urine  . US OB Comp Less 14 Wks    Standing Status: Future     Number of Occurrences:      Standing Expiration Date: 12/23/2016    Order Specific Question:  Reason for Exam (SYMPTOM  OR DIAGNOSIS REQUIRED)    Answer:  dating, viability, obese    Order Specific Question:  Preferred imaging location?    Answer:  [facility]Ul[facility]ound  . Obstetric panel  . HIV antibody  . Hemoglobinopathy evaluation  . Varicella zoster antibody, IgG  . VITAMIN D 25 Hydroxy (Vit-D Deficiency, Fractures)  . POCT urinalysis dipstick    Follow up in 4 weeks. 50% of 30 min visit spent on counseling and coordination of care.

## 2015-10-25 LAB — HIV ANTIBODY (ROUTINE TESTING W REFLEX): HIV 1&2 Ab, 4th Generation: NONREACTIVE

## 2015-10-25 LAB — VITAMIN D 25 HYDROXY (VIT D DEFICIENCY, FRACTURES): Vit D, 25-Hydroxy: 15 ng/mL — ABNORMAL LOW (ref 30–100)

## 2015-10-26 LAB — CULTURE, OB URINE

## 2015-10-27 LAB — OBSTETRIC PANEL
Antibody Screen: NEGATIVE
BASOS ABS: 0 10*3/uL (ref 0.0–0.1)
Basophils Relative: 0 % (ref 0–1)
EOS ABS: 0.1 10*3/uL (ref 0.0–0.7)
Eosinophils Relative: 1 % (ref 0–5)
HCT: 37 % (ref 36.0–46.0)
HEMOGLOBIN: 12.2 g/dL (ref 12.0–15.0)
Hepatitis B Surface Ag: NEGATIVE
Lymphocytes Relative: 39 % (ref 12–46)
Lymphs Abs: 2 10*3/uL (ref 0.7–4.0)
MCH: 23.5 pg — AB (ref 26.0–34.0)
MCHC: 33 g/dL (ref 30.0–36.0)
MCV: 71.3 fL — ABNORMAL LOW (ref 78.0–100.0)
MPV: 10 fL (ref 8.6–12.4)
Monocytes Absolute: 0.5 10*3/uL (ref 0.1–1.0)
Monocytes Relative: 9 % (ref 3–12)
NEUTROS PCT: 51 % (ref 43–77)
Neutro Abs: 2.7 10*3/uL (ref 1.7–7.7)
Platelets: 258 10*3/uL (ref 150–400)
RBC: 5.19 MIL/uL — ABNORMAL HIGH (ref 3.87–5.11)
RDW: 17.5 % — AB (ref 11.5–15.5)
Rh Type: POSITIVE
Rubella: 1.91 Index — ABNORMAL HIGH (ref <0.90)
WBC: 5.2 10*3/uL (ref 4.0–10.5)

## 2015-10-27 LAB — HEMOGLOBINOPATHY EVALUATION
HEMOGLOBIN OTHER: 30.2 % — AB
HGB S QUANTITAION: 0 %
Hgb A2 Quant: 3.5 % — ABNORMAL HIGH (ref 2.2–3.2)
Hgb A: 66.3 % — ABNORMAL LOW (ref 96.8–97.8)
Hgb F Quant: 0 % (ref 0.0–2.0)

## 2015-10-27 LAB — VARICELLA ZOSTER ANTIBODY, IGG: Varicella IgG: 789.6 Index — ABNORMAL HIGH (ref <135.00)

## 2015-10-29 ENCOUNTER — Other Ambulatory Visit: Payer: Self-pay | Admitting: Certified Nurse Midwife

## 2015-10-29 LAB — SURESWAB, VAGINOSIS/VAGINITIS PLUS
ATOPOBIUM VAGINAE: 5.9 Log (cells/mL)
C. PARAPSILOSIS, DNA: NOT DETECTED
C. albicans, DNA: DETECTED — AB
C. glabrata, DNA: NOT DETECTED
C. trachomatis RNA, TMA: NOT DETECTED
C. tropicalis, DNA: NOT DETECTED
Gardnerella vaginalis: 7.5 Log (cells/mL)
LACTOBACILLUS SPECIES: NOT DETECTED Log (cells/mL)
MEGASPHAERA SPECIES: 5.9 Log (cells/mL)
N. GONORRHOEAE RNA, TMA: NOT DETECTED
T. vaginalis RNA, QL TMA: DETECTED — AB

## 2015-10-29 LAB — PAP, TP IMAGING W/ HPV RNA, RFLX HPV TYPE 16,18/45: HPV mRNA, High Risk: NOT DETECTED

## 2015-10-29 MED ORDER — NITROFURANTOIN MONOHYD MACRO 100 MG PO CAPS: 100.0000 mg | ORAL_CAPSULE | Freq: Two times a day (BID) | ORAL | Status: AC

## 2015-10-30 ENCOUNTER — Other Ambulatory Visit: Payer: Self-pay | Admitting: Certified Nurse Midwife

## 2015-10-30 DIAGNOSIS — N760 Acute vaginitis: Principal | ICD-10-CM

## 2015-10-30 DIAGNOSIS — B373 Candidiasis of vulva and vagina: Secondary | ICD-10-CM

## 2015-10-30 DIAGNOSIS — B9689 Other specified bacterial agents as the cause of diseases classified elsewhere: Secondary | ICD-10-CM

## 2015-10-30 DIAGNOSIS — B3731 Acute candidiasis of vulva and vagina: Secondary | ICD-10-CM

## 2015-10-30 MED ORDER — FLUCONAZOLE 100 MG PO TABS: 100.0000 mg | ORAL_TABLET | Freq: Once | ORAL | Status: DC

## 2015-10-30 MED ORDER — METRONIDAZOLE 500 MG PO TABS: 500.0000 mg | ORAL_TABLET | Freq: Two times a day (BID) | ORAL | Status: DC

## 2015-10-30 MED ORDER — TERCONAZOLE 0.4 % VA CREA: 1.0000 | TOPICAL_CREAM | Freq: Every day | VAGINAL | Status: DC

## 2015-10-31 ENCOUNTER — Ambulatory Visit (HOSPITAL_COMMUNITY)
Admission: RE | Admit: 2015-10-31 | Discharge: 2015-10-31 | Disposition: A | Discharge status: Home / Self Care | Payer: Managed Care, Other (non HMO) | Source: Ambulatory Visit | Attending: Certified Nurse Midwife | Admitting: Certified Nurse Midwife

## 2015-10-31 ENCOUNTER — Other Ambulatory Visit: Payer: Self-pay | Admitting: Certified Nurse Midwife

## 2015-10-31 DIAGNOSIS — Z3A11 11 weeks gestation of pregnancy: Secondary | ICD-10-CM | POA: Diagnosis not present

## 2015-10-31 DIAGNOSIS — O99211 Obesity complicating pregnancy, first trimester: Secondary | ICD-10-CM | POA: Diagnosis not present

## 2015-10-31 DIAGNOSIS — Z36 Encounter for antenatal screening of mother: Secondary | ICD-10-CM | POA: Diagnosis present

## 2015-10-31 DIAGNOSIS — Z3401 Encounter for supervision of normal first pregnancy, first trimester: Secondary | ICD-10-CM

## 2015-10-31 LAB — HGB ELECTROPHORESIS REFLEXED REPORT
HEMOGLOBIN A - HGBRFX: 63 % — AB (ref >96.0)
Hemoglobin A2 - HGBRFX: 3.3 % (ref 1.8–3.5)
Hemoglobin Elect C: 33.7 % — ABNORMAL HIGH
Hemoglobin F - HGBRFX: 0 % (ref <2.0)
Sickle Solubility Test - HGBRFX: NEGATIVE

## 2015-11-04 ENCOUNTER — Other Ambulatory Visit: Payer: Self-pay | Admitting: Certified Nurse Midwife

## 2015-11-04 ENCOUNTER — Encounter (HOSPITAL_COMMUNITY): Payer: Self-pay | Admitting: Certified Nurse Midwife

## 2015-11-04 DIAGNOSIS — Z3401 Encounter for supervision of normal first pregnancy, first trimester: Secondary | ICD-10-CM

## 2015-11-04 DIAGNOSIS — E669 Obesity, unspecified: Secondary | ICD-10-CM

## 2015-11-04 DIAGNOSIS — D563 Thalassemia minor: Secondary | ICD-10-CM

## 2015-11-21 ENCOUNTER — Ambulatory Visit (INDEPENDENT_AMBULATORY_CARE_PROVIDER_SITE_OTHER): Payer: Managed Care, Other (non HMO) | Admitting: Certified Nurse Midwife

## 2015-11-21 ENCOUNTER — Encounter: Payer: Self-pay | Admitting: Certified Nurse Midwife

## 2015-11-21 VITALS — BP 149/85 | HR 83 | Wt 377.0 lb

## 2015-11-21 DIAGNOSIS — O132 Gestational [pregnancy-induced] hypertension without significant proteinuria, second trimester: Secondary | ICD-10-CM

## 2015-11-21 DIAGNOSIS — O162 Unspecified maternal hypertension, second trimester: Secondary | ICD-10-CM

## 2015-11-21 DIAGNOSIS — Z3402 Encounter for supervision of normal first pregnancy, second trimester: Secondary | ICD-10-CM

## 2015-11-21 DIAGNOSIS — O219 Vomiting of pregnancy, unspecified: Secondary | ICD-10-CM

## 2015-11-21 LAB — POCT URINALYSIS DIPSTICK
Bilirubin, UA: NEGATIVE
Blood, UA: NEGATIVE
GLUCOSE UA: NEGATIVE
KETONES UA: NEGATIVE
LEUKOCYTES UA: NEGATIVE
Nitrite, UA: NEGATIVE
SPEC GRAV UA: 1.025
Urobilinogen, UA: NEGATIVE
pH, UA: 6

## 2015-11-21 MED ORDER — ONDANSETRON HCL 4 MG PO TABS: 4.0000 mg | ORAL_TABLET | Freq: Every day | ORAL | Status: DC | PRN

## 2015-11-21 NOTE — Progress Notes (Signed)
  Subjective:    Marissa Lowe is a 35 y.o. female being seen today for her obstetrical visit. She is at 5065w4d gestation. Patient reports: no complaints.  Problem List Items Addressed This Visit    None    Visit Diagnoses    Encounter for supervision of normal first pregnancy in second trimester    -  Primary    Relevant Orders    POCT urinalysis dipstick (Completed)    US MFM OB COMP + 14 WK    AMB referral to maternal fetal medicine    Lactate dehydrogenase    Creatinine, serum    CBC    ALT    AST    Pathologist smear review    Protein / creatinine ratio, urine    Elevated blood pressure affecting pregnancy in second trimester, antepartum        Relevant Orders    Lactate dehydrogenase    Creatinine, serum    CBC    ALT    AST    Pathologist smear review    Protein / creatinine ratio, urine    Nausea and vomiting during pregnancy prior to [redacted] weeks gestation        Relevant Medications    ondansetron (ZOFRAN) 4 MG tablet      Patient Active Problem List   Diagnosis Date Noted  . Morbid obesity with BMI of 50.0-59.9, adult (HCC) 05/09/2014  . Unspecified vitamin D deficiency 03/02/2013  . RBC microcytosis 03/02/2013  . Allergic rhinitis, cause unspecified 03/02/2013    Objective:     BP 149/85 mmHg  Pulse 83  Wt 377 lb (171.006 kg)  LMP 08/21/2015 Uterine Size: Below umbilicus   FHR seen with bedside US  Assessment:    Pregnancy @ 5265w4d  weeks Doing well    Plan:    Problem list reviewed and updated. Labs reviewed.  Follow up in 4 weeks. FIRST/CF mutation testing/NIPT/QUAD SCREEN/fragile X/Ashkenazi Jewish population testing/Spinal muscular atrophy discussed: requested. Role of ultrasound in pregnancy discussed; fetal survey: ordered. Amniocentesis discussed: not indicated. 50% of 15 minute visit spent on counseling and coordination of care.

## 2015-11-22 LAB — CREATININE, SERUM
Creatinine, Ser: 0.55 mg/dL — ABNORMAL LOW (ref 0.57–1.00)
GFR, EST AFRICAN AMERICAN: 141 mL/min/{1.73_m2} (ref >59)
GFR, EST NON AFRICAN AMERICAN: 122 mL/min/{1.73_m2} (ref >59)

## 2015-11-22 LAB — LACTATE DEHYDROGENASE: LDH: 161 IU/L (ref 119–226)

## 2015-11-22 LAB — ALT: ALT: 17 IU/L (ref 0–32)

## 2015-11-22 LAB — AST: AST: 12 IU/L (ref 0–40)

## 2015-11-22 LAB — CBC
Hematocrit: 34.1 % (ref 34.0–46.6)
Hemoglobin: 11.7 g/dL (ref 11.1–15.9)
MCH: 24.2 pg — AB (ref 26.6–33.0)
MCHC: 34.3 g/dL (ref 31.5–35.7)
MCV: 71 fL — ABNORMAL LOW (ref 79–97)
Platelets: 264 10*3/uL (ref 150–379)
RBC: 4.84 x10E6/uL (ref 3.77–5.28)
RDW: 16.6 % — AB (ref 12.3–15.4)
WBC: 6.6 10*3/uL (ref 3.4–10.8)

## 2015-11-22 LAB — PROTEIN / CREATININE RATIO, URINE
CREATININE, UR: 215.7 mg/dL
PROTEIN UR: 12.9 mg/dL
PROTEIN/CREAT RATIO: 60 mg/g{creat} (ref 0–200)

## 2015-11-24 ENCOUNTER — Ambulatory Visit (HOSPITAL_COMMUNITY): Admission: RE | Admit: 2015-11-24 | Payer: Managed Care, Other (non HMO) | Source: Ambulatory Visit

## 2015-11-24 ENCOUNTER — Ambulatory Visit (HOSPITAL_COMMUNITY)
Admission: RE | Admit: 2015-11-24 | Discharge: 2015-11-24 | Disposition: A | Discharge status: Home / Self Care | Payer: Managed Care, Other (non HMO) | Source: Ambulatory Visit | Attending: Certified Nurse Midwife | Admitting: Certified Nurse Midwife

## 2015-11-24 DIAGNOSIS — E669 Obesity, unspecified: Secondary | ICD-10-CM

## 2015-11-24 DIAGNOSIS — D563 Thalassemia minor: Secondary | ICD-10-CM

## 2015-11-24 DIAGNOSIS — Z3401 Encounter for supervision of normal first pregnancy, first trimester: Secondary | ICD-10-CM

## 2015-11-25 LAB — PATHOLOGIST SMEAR REVIEW
Path Rev PLTs: NORMAL
Path Rev RBC: NORMAL
Path Rev WBC: NORMAL

## 2015-12-13 ENCOUNTER — Encounter (HOSPITAL_COMMUNITY): Payer: Self-pay | Admitting: *Deleted

## 2015-12-13 ENCOUNTER — Inpatient Hospital Stay (HOSPITAL_COMMUNITY)
Admission: AD | Admit: 2015-12-13 | Discharge: 2015-12-13 | Disposition: A | Discharge status: Home / Self Care | Payer: Managed Care, Other (non HMO) | Source: Ambulatory Visit | Attending: Obstetrics | Admitting: Obstetrics

## 2015-12-13 DIAGNOSIS — O26892 Other specified pregnancy related conditions, second trimester: Secondary | ICD-10-CM | POA: Insufficient documentation

## 2015-12-13 DIAGNOSIS — Z3A17 17 weeks gestation of pregnancy: Secondary | ICD-10-CM | POA: Diagnosis not present

## 2015-12-13 DIAGNOSIS — K648 Other hemorrhoids: Secondary | ICD-10-CM | POA: Diagnosis not present

## 2015-12-13 DIAGNOSIS — K625 Hemorrhage of anus and rectum: Secondary | ICD-10-CM | POA: Insufficient documentation

## 2015-12-13 DIAGNOSIS — Z87891 Personal history of nicotine dependence: Secondary | ICD-10-CM | POA: Diagnosis not present

## 2015-12-13 DIAGNOSIS — K5900 Constipation, unspecified: Secondary | ICD-10-CM | POA: Insufficient documentation

## 2015-12-13 LAB — URINALYSIS, ROUTINE W REFLEX MICROSCOPIC
BILIRUBIN URINE: NEGATIVE
Glucose, UA: NEGATIVE mg/dL
HGB URINE DIPSTICK: NEGATIVE
KETONES UR: NEGATIVE mg/dL
Leukocytes, UA: NEGATIVE
NITRITE: NEGATIVE
Protein, ur: NEGATIVE mg/dL
Specific Gravity, Urine: >1.03 — ABNORMAL HIGH (ref 1.005–1.030)
pH: 6 (ref 5.0–8.0)

## 2015-12-13 NOTE — MAU Note (Signed)
Went to bathroom and noticed blood in toilet. Blood coming form rectum

## 2015-12-13 NOTE — MAU Provider Note (Signed)
MAU HISTORY AND PHYSICAL  Chief Complaint:  Rectal Bleeding   Marissa Lowe is a 35 y.o.  G1P0000 with IUP at 3165w5d presenting for Rectal Bleeding  No pain, no cramping. Noticed today when wiped. No known hx hemorrhoids. No fevers, no dysuria. No pain w/ defecation. Strained to pass hard stool last night.   Past Medical History  Diagnosis Date  . Obesity   . Seasonal allergies   . Vitamin D deficiency   . Arthritis of foot, left     (see imaging 02/2014)    History reviewed. No pertinent past surgical history.  Family History  Problem Relation Age of Onset  . Hypertension Sister   . Hypertension Brother   . Diabetes Mother   . Heart failure Father   . Hypertension Father   . Cancer Neg Hx   . Stroke Neg Hx   . Glaucoma Brother     Social History  Substance Use Topics  . Smoking status: Former Smoker    Types: Cigarettes    Quit date: 09/01/2009  . Smokeless tobacco: Never Used  . Alcohol Use: No    Allergies  Allergen Reactions  . Penicillins Hives and Other (See Comments)    Has patient had a PCN reaction causing immediate rash, facial/tongue/throat swelling, SOB or lightheadedness with hypotension: No Has patient had a PCN reaction causing severe rash involving mucus membranes or skin necrosis: No Has patient had a PCN reaction that required hospitalization No Has patient had a PCN reaction occurring within the last 10 years: No If all of the above answers are "NO", then may proceed with Cephalosporin use.    Prescriptions prior to admission  Medication Sig Dispense Refill Last Dose  . acetaminophen (TYLENOL) 500 MG tablet Take 500-1,000 mg by mouth every 6 (six) hours as needed for mild pain, moderate pain or headache.    12/12/2015 at Unknown time  . cholecalciferol (VITAMIN D) 1000 UNITS tablet Take 1,000 Units by mouth at bedtime.    12/12/2015 at Unknown time  . loratadine (CLARITIN) 10 MG tablet Take 10 mg by mouth daily as needed for allergies.   Past Week  at Unknown time  . Prenatal Vit-Fe Fumarate-FA (PRENATAL MULTIVITAMIN) TABS tablet Take 1 tablet by mouth at bedtime.   12/12/2015 at Unknown time  . fluconazole (DIFLUCAN) 100 MG tablet Take 1 tablet (100 mg total) by mouth once. Repeat dose in 48-72 hour. (Patient not taking: Reported on 12/13/2015) 3 tablet 0   . metroNIDAZOLE (FLAGYL) 500 MG tablet Take 1 tablet (500 mg total) by mouth 2 (two) times daily. (Patient not taking: Reported on 12/13/2015) 14 tablet 0   . ondansetron (ZOFRAN) 4 MG tablet Take 1 tablet (4 mg total) by mouth daily as needed. (Patient not taking: Reported on 12/13/2015) 30 tablet 1   . terconazole (TERAZOL 7) 0.4 % vaginal cream Place 1 applicator vaginally at bedtime. (Patient not taking: Reported on 12/13/2015) 45 g 0     Review of Systems - Negative except for what is mentioned in HPI.  Physical Exam  Blood pressure 134/68, pulse 86, temperature 98.1 F (36.7 C), temperature source Oral, resp. rate 18, height 5\' 3"  (1.6 m), last menstrual period 08/21/2015. GENERAL: Well-developed, well-nourished female in no acute distress.  LUNGS: Clear to auscultation bilaterally.  HEART: Regular rate and rhythm. ABDOMEN: Soft, nontender, nondistended, gravid.  EXTREMITIES: Nontender, no edema, 2+ distal pulses. Rectal: scant amount bleeding at external rectum, no external hemorrhoids GU: normal vagina no lacerations, normal  cervix, os closed, no bleeding   FHT: 150s  Labs: Results for orders placed or performed during the hospital encounter of 12/13/15 (from the past 24 hour(s))  Urinalysis, Routine w reflex microscopic (not at [facility])   Collection Time: 12/13/15  8:45 PM  Result Value Ref Range   Color, Urine YELLOW YELLOW   APPearance CLEAR CLEAR   Specific Gravity, Urine >1.030 (H) 1.005 - 1.030   pH 6.0 5.0 - 8.0   Glucose, UA NEGATIVE NEGATIVE mg/dL   Hgb urine dipstick NEGATIVE NEGATIVE   Bilirubin Urine NEGATIVE NEGATIVE   Ketones, ur NEGATIVE NEGATIVE mg/dL    Protein, ur NEGATIVE NEGATIVE mg/dL   Nitrite NEGATIVE NEGATIVE   Leukocytes, UA NEGATIVE NEGATIVE    Imaging Studies:  No results found.  Assessment: Marissa Lowe is  35 y.o. G1P0000 at [redacted]w[redacted]d presents with Rectal Bleeding Likely 2/2 internal hemorrhoids (painless, no external hemorhroids visualized). Constipated, last night passed hard stool, so recommending start miralax and f/u on Tuesday as scheduled w/ OB. Rh positive, known IUP on u/s, no pain, no blood in vagina, normal FHTs - do not think intrauterine bleeding.  Plan: - abruption, ptl, miscarriage return precautions - ob f/u 3 days as scheduled  Silvano Bilis 4/22/201710:19 PM

## 2015-12-13 NOTE — Discharge Instructions (Signed)

## 2015-12-16 ENCOUNTER — Encounter (HOSPITAL_COMMUNITY): Payer: Self-pay

## 2015-12-16 ENCOUNTER — Other Ambulatory Visit: Payer: Self-pay | Admitting: Certified Nurse Midwife

## 2015-12-16 ENCOUNTER — Other Ambulatory Visit (HOSPITAL_COMMUNITY): Payer: Managed Care, Other (non HMO)

## 2015-12-16 ENCOUNTER — Ambulatory Visit (HOSPITAL_COMMUNITY)
Admission: RE | Admit: 2015-12-16 | Discharge: 2015-12-16 | Disposition: A | Discharge status: Home / Self Care | Payer: Managed Care, Other (non HMO) | Source: Ambulatory Visit | Attending: Certified Nurse Midwife | Admitting: Certified Nurse Midwife

## 2015-12-16 DIAGNOSIS — Z3689 Encounter for other specified antenatal screening: Secondary | ICD-10-CM

## 2015-12-16 DIAGNOSIS — Z3402 Encounter for supervision of normal first pregnancy, second trimester: Secondary | ICD-10-CM

## 2015-12-16 DIAGNOSIS — O09512 Supervision of elderly primigravida, second trimester: Secondary | ICD-10-CM | POA: Insufficient documentation

## 2015-12-16 DIAGNOSIS — Z36 Encounter for antenatal screening of mother: Secondary | ICD-10-CM | POA: Insufficient documentation

## 2015-12-16 DIAGNOSIS — Z3A18 18 weeks gestation of pregnancy: Secondary | ICD-10-CM | POA: Insufficient documentation

## 2015-12-16 DIAGNOSIS — D569 Thalassemia, unspecified: Secondary | ICD-10-CM

## 2015-12-16 DIAGNOSIS — O99212 Obesity complicating pregnancy, second trimester: Secondary | ICD-10-CM | POA: Insufficient documentation

## 2015-12-16 DIAGNOSIS — O99012 Anemia complicating pregnancy, second trimester: Secondary | ICD-10-CM

## 2015-12-17 ENCOUNTER — Other Ambulatory Visit (HOSPITAL_COMMUNITY): Payer: Self-pay | Admitting: *Deleted

## 2015-12-17 DIAGNOSIS — O09519 Supervision of elderly primigravida, unspecified trimester: Secondary | ICD-10-CM

## 2015-12-19 ENCOUNTER — Ambulatory Visit (INDEPENDENT_AMBULATORY_CARE_PROVIDER_SITE_OTHER): Payer: Managed Care, Other (non HMO) | Admitting: Certified Nurse Midwife

## 2015-12-19 VITALS — BP 129/84 | HR 84 | Temp 98.8°F | Wt 387.0 lb

## 2015-12-19 DIAGNOSIS — Z3402 Encounter for supervision of normal first pregnancy, second trimester: Secondary | ICD-10-CM

## 2015-12-19 LAB — POCT URINALYSIS DIPSTICK
Bilirubin, UA: NEGATIVE
Blood, UA: NEGATIVE
GLUCOSE UA: NEGATIVE
Ketones, UA: NEGATIVE
LEUKOCYTES UA: NEGATIVE
NITRITE UA: NEGATIVE
PROTEIN UA: NEGATIVE
SPEC GRAV UA: 1.025
UROBILINOGEN UA: NEGATIVE
pH, UA: 5

## 2015-12-19 NOTE — Progress Notes (Signed)
Patient has no concerns or questions today 

## 2015-12-19 NOTE — Progress Notes (Signed)
Subjective:    Marissa Lowe is a 35 y.o. female being seen today for her obstetrical visit. She is at 152w4d gestation. Patient reports: no complaints . Fetal movement: normal.  Problem List Items Addressed This Visit    None    Visit Diagnoses    Supervision of normal first pregnancy in second trimester    -  Primary    Relevant Orders    AFP, Quad Screen      Patient Active Problem List   Diagnosis Date Noted  . Morbid obesity with BMI of 50.0-59.9, adult (HCC) 05/09/2014  . Unspecified vitamin D deficiency 03/02/2013  . RBC microcytosis 03/02/2013  . Allergic rhinitis, cause unspecified 03/02/2013   Objective:    BP 129/84 mmHg  Pulse 84  Temp(Src) 98.8 F (37.1 C)  Wt 387 lb (175.542 kg)  LMP 08/21/2015 FHT: 156 BPM  Uterine Size: unable to determine d/t body habitus     Assessment:    Pregnancy @ 222w4d    Plan:    OBGCT: discussed. Signs and symptoms of preterm labor: discussed.  Labs, problem list reviewed and updated 2 hr GTT planned Follow up in 4 weeks.

## 2015-12-23 ENCOUNTER — Other Ambulatory Visit: Payer: Self-pay | Admitting: Certified Nurse Midwife

## 2015-12-25 ENCOUNTER — Other Ambulatory Visit: Payer: Self-pay | Admitting: Certified Nurse Midwife

## 2015-12-30 LAB — AFP, QUAD SCREEN
DIA Mom Value: 1.3
DIA Value (EIA): 150.62 pg/mL
DSR (BY AGE) 1 IN: 233
DSR (SECOND TRIMESTER) 1 IN: 172
Gestational Age: 18.6 WEEKS
MSAFP Mom: 0.58
MSAFP: 18.5 ng/mL
MSHCG Mom: 1.19
MSHCG: 19267 m[IU]/mL
Maternal Age At EDD: 36.1 YEARS
OSB RISK: 10000
T18 (By Age): 1:908 {titer}
TEST RESULTS AFP: POSITIVE — AB
UE3 VALUE: 1.05 ng/mL
WEIGHT: 387 [lb_av]
uE3 Mom: 0.84

## 2015-12-31 ENCOUNTER — Other Ambulatory Visit: Payer: Self-pay | Admitting: Certified Nurse Midwife

## 2015-12-31 DIAGNOSIS — O280 Abnormal hematological finding on antenatal screening of mother: Secondary | ICD-10-CM

## 2016-01-16 ENCOUNTER — Encounter: Payer: Managed Care, Other (non HMO) | Admitting: Certified Nurse Midwife

## 2016-01-21 ENCOUNTER — Encounter: Payer: Managed Care, Other (non HMO) | Admitting: Certified Nurse Midwife

## 2016-01-23 ENCOUNTER — Ambulatory Visit (INDEPENDENT_AMBULATORY_CARE_PROVIDER_SITE_OTHER): Payer: Managed Care, Other (non HMO) | Admitting: Certified Nurse Midwife

## 2016-01-23 VITALS — BP 130/84 | HR 76 | Temp 98.8°F | Wt >= 6400 oz

## 2016-01-23 DIAGNOSIS — Z3402 Encounter for supervision of normal first pregnancy, second trimester: Secondary | ICD-10-CM

## 2016-01-23 LAB — POCT URINALYSIS DIPSTICK
Bilirubin, UA: NEGATIVE
Glucose, UA: NEGATIVE
KETONES UA: NEGATIVE
LEUKOCYTES UA: NEGATIVE
NITRITE UA: NEGATIVE
RBC UA: NEGATIVE
Spec Grav, UA: 1.025
Urobilinogen, UA: NEGATIVE
pH, UA: 5

## 2016-01-23 NOTE — Progress Notes (Signed)
Subjective:    Marissa Lowe is a 35 y.o. female being seen today for her obstetrical visit. She is at 2710w4d gestation. Patient reports: no bleeding, no contractions, no cramping, no leaking and edema in lower legs . Fetal movement: normal.  Problem List Items Addressed This Visit    None     Patient Active Problem List   Diagnosis Date Noted  . Morbid obesity with BMI of 50.0-59.9, adult (HCC) 05/09/2014  . Unspecified vitamin D deficiency 03/02/2013  . RBC microcytosis 03/02/2013  . Allergic rhinitis, cause unspecified 03/02/2013   Objective:    LMP 08/21/2015 FHT: 145 BPM  Uterine Size: maternal obesity, unable to determine     Assessment:    Pregnancy @ 7010w4d    Maternal obesity   Plan:   Considering PIH labs, on recheck B/P normotensive with manual blood pressure.   OBGCT: discussed and ordered for next visit. Signs and symptoms of preterm labor: discussed.  Labs, problem list reviewed and updated 2 hr GTT planned Follow up in 4 weeks.

## 2016-01-27 ENCOUNTER — Other Ambulatory Visit (HOSPITAL_COMMUNITY): Payer: Self-pay | Admitting: Maternal and Fetal Medicine

## 2016-01-27 ENCOUNTER — Ambulatory Visit (HOSPITAL_COMMUNITY)
Admission: RE | Admit: 2016-01-27 | Discharge: 2016-01-27 | Disposition: A | Discharge status: Home / Self Care | Payer: Managed Care, Other (non HMO) | Source: Ambulatory Visit | Attending: Certified Nurse Midwife | Admitting: Certified Nurse Midwife

## 2016-01-27 ENCOUNTER — Encounter (HOSPITAL_COMMUNITY): Payer: Self-pay

## 2016-01-27 DIAGNOSIS — Z3A24 24 weeks gestation of pregnancy: Secondary | ICD-10-CM | POA: Diagnosis not present

## 2016-01-27 DIAGNOSIS — O2692 Pregnancy related conditions, unspecified, second trimester: Secondary | ICD-10-CM

## 2016-01-27 DIAGNOSIS — Z6841 Body Mass Index (BMI) 40.0 and over, adult: Secondary | ICD-10-CM | POA: Diagnosis not present

## 2016-01-27 DIAGNOSIS — O09512 Supervision of elderly primigravida, second trimester: Secondary | ICD-10-CM | POA: Insufficient documentation

## 2016-01-27 DIAGNOSIS — O99212 Obesity complicating pregnancy, second trimester: Secondary | ICD-10-CM

## 2016-01-27 DIAGNOSIS — O09519 Supervision of elderly primigravida, unspecified trimester: Secondary | ICD-10-CM

## 2016-01-28 ENCOUNTER — Other Ambulatory Visit (HOSPITAL_COMMUNITY): Payer: Self-pay | Admitting: *Deleted

## 2016-01-28 DIAGNOSIS — O09519 Supervision of elderly primigravida, unspecified trimester: Secondary | ICD-10-CM

## 2016-02-06 ENCOUNTER — Other Ambulatory Visit: Payer: Self-pay | Admitting: Certified Nurse Midwife

## 2016-02-20 ENCOUNTER — Other Ambulatory Visit: Payer: Medicaid Other

## 2016-02-20 ENCOUNTER — Ambulatory Visit (INDEPENDENT_AMBULATORY_CARE_PROVIDER_SITE_OTHER): Payer: Medicaid Other | Admitting: Certified Nurse Midwife

## 2016-02-20 VITALS — BP 118/80 | HR 85 | Wt >= 6400 oz

## 2016-02-20 DIAGNOSIS — Z3402 Encounter for supervision of normal first pregnancy, second trimester: Secondary | ICD-10-CM

## 2016-02-20 NOTE — Progress Notes (Signed)
Subjective:    Marissa Lowe is a 35 y.o. female being seen today for her obstetrical visit. She is at 4623w4d gestation. Patient reports: no complaints . Fetal movement: normal.  Problem List Items Addressed This Visit    None    Visit Diagnoses    Encounter for supervision of normal first pregnancy in second trimester    -  Primary    Relevant Orders    Glucose Tolerance, 2 Hours w/1 Hour    CBC    HIV antibody    RPR      Patient Active Problem List   Diagnosis Date Noted  . Morbid obesity with BMI of 50.0-59.9, adult (HCC) 05/09/2014  . Unspecified vitamin D deficiency 03/02/2013  . RBC microcytosis 03/02/2013  . Allergic rhinitis, cause unspecified 03/02/2013   Objective:    BP 118/80 mmHg  Pulse 85  Wt 408 lb (185.068 kg)  LMP 08/21/2015 FHT: 140 BPM @ umbilicus  Uterine Size: unable to determine d/t morbid body habitus     Assessment:    Pregnancy @ 2123w4d    Plan:    OBGCT: ordered. Signs and symptoms of preterm labor: discussed.  Labs, problem list reviewed and updated 2 hr GTT planned Follow up in 2 weeks.

## 2016-02-21 LAB — CBC
Hematocrit: 35.1 % (ref 34.0–46.6)
Hemoglobin: 11.6 g/dL (ref 11.1–15.9)
MCH: 23.8 pg — AB (ref 26.6–33.0)
MCHC: 33 g/dL (ref 31.5–35.7)
MCV: 72 fL — AB (ref 79–97)
PLATELETS: 210 10*3/uL (ref 150–379)
RBC: 4.87 x10E6/uL (ref 3.77–5.28)
RDW: 17.8 % — AB (ref 12.3–15.4)
WBC: 6.8 10*3/uL (ref 3.4–10.8)

## 2016-02-21 LAB — GLUCOSE TOLERANCE, 2 HOURS W/ 1HR
GLUCOSE, FASTING: 90 mg/dL (ref 65–91)
Glucose, 1 hour: 156 mg/dL (ref 65–179)
Glucose, 2 hour: 103 mg/dL (ref 65–152)

## 2016-02-21 LAB — HIV ANTIBODY (ROUTINE TESTING W REFLEX): HIV Screen 4th Generation wRfx: NONREACTIVE

## 2016-02-21 LAB — RPR: RPR Ser Ql: NONREACTIVE

## 2016-03-05 ENCOUNTER — Ambulatory Visit (INDEPENDENT_AMBULATORY_CARE_PROVIDER_SITE_OTHER): Payer: Medicaid Other | Admitting: Obstetrics

## 2016-03-05 VITALS — BP 128/83 | HR 89 | Temp 98.2°F | Wt >= 6400 oz

## 2016-03-05 DIAGNOSIS — Z3403 Encounter for supervision of normal first pregnancy, third trimester: Secondary | ICD-10-CM

## 2016-03-05 LAB — POCT URINALYSIS DIPSTICK
Bilirubin, UA: NEGATIVE
Blood, UA: NEGATIVE
GLUCOSE UA: NORMAL
Nitrite, UA: NEGATIVE
Protein, UA: NEGATIVE
SPEC GRAV UA: 1.025
Urobilinogen, UA: NEGATIVE
pH, UA: 5

## 2016-03-07 ENCOUNTER — Encounter: Payer: Self-pay | Admitting: Obstetrics

## 2016-03-07 NOTE — Progress Notes (Signed)
Subjective:    Marissa Lowe is a 35 y.o. female being seen today for her obstetrical visit. She is at 2720w6d gestation. Patient reports no complaints. Fetal movement: normal.  Problem List Items Addressed This Visit    None    Visit Diagnoses    Encounter for supervision of normal first pregnancy in second trimester    -  Primary    Relevant Orders    POCT urinalysis dipstick (Completed)      Patient Active Problem List   Diagnosis Date Noted  . Morbid obesity with BMI of 50.0-59.9, adult (HCC) 05/09/2014  . Unspecified vitamin D deficiency 03/02/2013  . RBC microcytosis 03/02/2013  . Allergic rhinitis, cause unspecified 03/02/2013   Objective:    BP 128/83 mmHg  Pulse 89  Temp(Src) 98.2 F (36.8 C)  Wt 417 lb 12.8 oz (189.513 kg)  LMP 08/21/2015 FHT:  150 BPM  Uterine Size: size equals dates  Presentation: unsure     Assessment:    Pregnancy @ 1620w6d weeks   Plan:     labs reviewed, problem list updated Consent signed. GBS sent TDAP offered  Rhogam given for RH negative Pediatrician: discussed. Infant feeding: plans to breastfeed. Maternity leave: discussed. Cigarette smoking: former smoker. Orders Placed This Encounter  Procedures  . POCT urinalysis dipstick    Associate with diagnosis code Z13.89   No orders of the defined types were placed in this encounter.   Follow up in 2 Weeks.

## 2016-03-09 ENCOUNTER — Ambulatory Visit (HOSPITAL_COMMUNITY)
Admission: RE | Admit: 2016-03-09 | Discharge: 2016-03-09 | Disposition: A | Discharge status: Home / Self Care | Payer: Medicaid Other | Source: Ambulatory Visit | Attending: Certified Nurse Midwife | Admitting: Certified Nurse Midwife

## 2016-03-09 ENCOUNTER — Other Ambulatory Visit (HOSPITAL_COMMUNITY): Payer: Self-pay | Admitting: Maternal and Fetal Medicine

## 2016-03-09 ENCOUNTER — Encounter (HOSPITAL_COMMUNITY): Payer: Self-pay

## 2016-03-09 DIAGNOSIS — O99213 Obesity complicating pregnancy, third trimester: Secondary | ICD-10-CM

## 2016-03-09 DIAGNOSIS — O289 Unspecified abnormal findings on antenatal screening of mother: Secondary | ICD-10-CM

## 2016-03-09 DIAGNOSIS — O283 Abnormal ultrasonic finding on antenatal screening of mother: Secondary | ICD-10-CM | POA: Diagnosis present

## 2016-03-09 DIAGNOSIS — O09513 Supervision of elderly primigravida, third trimester: Secondary | ICD-10-CM

## 2016-03-09 DIAGNOSIS — O280 Abnormal hematological finding on antenatal screening of mother: Secondary | ICD-10-CM

## 2016-03-09 DIAGNOSIS — O09519 Supervision of elderly primigravida, unspecified trimester: Secondary | ICD-10-CM

## 2016-03-09 DIAGNOSIS — Z3A30 30 weeks gestation of pregnancy: Secondary | ICD-10-CM

## 2016-03-09 DIAGNOSIS — O2693 Pregnancy related conditions, unspecified, third trimester: Secondary | ICD-10-CM

## 2016-03-15 DIAGNOSIS — O280 Abnormal hematological finding on antenatal screening of mother: Secondary | ICD-10-CM | POA: Insufficient documentation

## 2016-03-15 NOTE — Progress Notes (Signed)
Genetic Counseling  High-Risk Gestation Note  Appointment Date:  03/15/2016 Referred By: Roe Coombs, CNM Date of Birth:  [birth year]   Pregnancy History: G1P0000 Estimated Date of Delivery: 05/17/16 Estimated Gestational Age: [redacted]w[redacted]d Attending: Particia Nearing, MD    [name] was seen for genetic counseling regarding a maternal age of 35 y.o. and an increased risk for Down syndrome based on Quad screening. She was accompanied by relatives to today's visit.   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  Reviewed autosomal recessive inheritance of both and that risk to pregnancy depends upon status for father of the pregnancy  Offered molecular carrier testing to patient, particularly to confirm the alpha thalassemia carrier status and to assess if the deletions are in cis or trans with each other  Reviewed newborn screening for hemoglobinopathies  Advanced paternal age (35 years old)  Discussed carrier screening options  CF-declined  SMA-accepted today  Hemoglobinopathies-accepted screening via molecular carrier testing today to confirm and further clarify hemoglobin electrophoresis results  She was counseled regarding maternal age and the association with risk for chromosome conditions due to nondisjunction with aging of the ova.  We reviewed chromosomes, nondisjunction, and the associated 1 in 123 risk for fetal aneuploidy related to a maternal age of 35 years old at delivery.  They were counseled that the risk for  aneuploidy decreases as gestational age increases, accounting for those pregnancies which spontaneously abort.  We specifically discussed Down syndrome (trisomy 52), trisomies 41 and 53, and sex chromosome aneuploidies (47,XXX and 47,XXY) including the common features and prognoses of each.   We also reviewed [name]' maternal serum Quad screen result and the associated increase in risk for fetal Down syndrome (1 in 233 to 1 in 172).  They were counseled regarding other explanations for a screen positive result including normal variation and differences in maternal metabolism.  In addition, we reviewed the screen negative risks for trisomy 18 and ONTDs (1 in 10,000).  They understand that Quad screening provides a pregnancy specific risk for Down syndrome, but is not considered to be diagnostic.    We reviewed additional available screening options including noninvasive prenatal screening (NIPS)/cell free DNA (cfDNA) screening and detailed ultrasound.  She was counseled that screening tests are used to modify a patient's a priori risk for aneuploidy, typically based on age. This estimate provides a pregnancy specific risk assessment. We reviewed the benefits and limitations of each option. Specifically, we discussed the conditions for which each test screens, the detection rates, and false positive rates of each. She was also counseled regarding diagnostic testing via amniocentesis. We reviewed the approximate 1 in 300-500 risk for complications from amniocentesis, including spontaneous preterm labor at this gestational age. We discussed the possible results that the tests might provide including: positive, negative, unanticipated, and no result. Finally, they were counseled regarding the cost of each option and potential out of pocket expenses.  After consideration of all the options, she elected to proceed with NIPS today (Panorama through [facility]).  Those results will be available in 8-10 days.   She declined amniocentesis.   An  ultrasound was performed today. The ultrasound report will be sent under separate cover. Visualized fetal anatomy appeared normal, but limited views were obtained of some anatomy today.  She understands that screening tests cannot rule out all birth defects or genetic syndromes. The patient was advised of this limitation and states she still does not want additional testing at this time.   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 Hemoglobin is the oxygen-carrying pigment of red blood cells.  The type of hemoglobin we have is determined by inheritance.  Most people typically inherit hemoglobin A.  Other beta globin chain variant types of hemoglobin include hemoglobin C or hemoglobin S.  If an individual inherits hemoglobin A from one parent and hemoglobin C from the other parent, that individual has hemoglobin C trait (noted as hemoglobin AC).  Likewise, an individual who inherits hemoglobin A from one parent and hemoglobin S from the other parent has hemoglobin S trait (sickle cell trait) (hemoglobin AS).  Hemoglobin C trait and sickle cell trait are not associated with any illness and there are generally no symptoms.  We reviewed genes and chromosomes. [name] was counseled that sickle cell anemia is inherited in an autosomal recessive manner, and occurs when both copies of the hemoglobin gene are changed and produce an abnormal hemoglobin. Typically, one abnormal gene for the production of hemoglobin S is inherited from each parent. Both the mother and the father of the baby must carry a variant hemoglobin such as hemoglobin S or C in order for a pregnancy to be at increased risk for a disease such as sickle cell disease (hemoglobin SS) or hemoglobin C disease (hemoglobin CC) in this pregnancy. Individuals with sickle  cell disease (sickle cell anemia) have red blood cells that are not able to carry enough oxygen to all the tissues of the body, which may result in sickle cell crises, delays in growth and development, repeated infections, or hospitalization for blood transfusions and pain management. Since Ms. Harris does not carry hemoglobin S, this and future pregnancies are not expected to be at risk to inherit sickle cell disease (hemoglobin SS). Individuals with hemoglobin Dewart disease are typically described to have milder features than individuals with hemoglobin SS. Hemoglobin C disease is generally a benign condition, although it can be associated with mild hemolytic anemia and/or splenomegaly (enlarged spleen).  If an enlarged spleen is painful, sometimes a splenectomy (surgical removal of the spleen) is performed.  There appear to be no other symptoms associated with this hemoglobin C disease.   Given the recessive inheritance of beta globin chain variants, we discussed the importance of understanding the carrier status of the father of the pregnancy in order to accurately predict the risk of a hemoglobinopathy in the fetus. Ms Rundell reported that he would not likely be available for screening, but he has no known relatives with sickle cell anemia or sickle cell trait. Prior to carrier screening, he would have the general population chance of approximately 1 in 12 to have sickle cell trait (Hb S), meaning that the pregnancy has an approximate 1 in 48 chance for hemoglobin Pala disease. In theory, these odds may be lower, given the report that his 8 previous children to carry sickle cell trait. When both parents are identified carriers, prenatal diagnosis via amniocentesis would be available. Ms. Tiburcio Pea indicated that she would not be  interested in amniocentesis in the pregnancy. We also reviewed the availability of newborn screening in [place] for hemoglobinopathies.   Ms. 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 Alpha thalassemia is different in its inheritance compared to other hemoglobinopathies as there are two alpha globin genes on each chromosome 16 (aa/aa).  A person can be a carrier of one alpha gene mutation (aa/a-), also referred to as a "silent carrier" or of more than one mutation.  A person who carriers two alpha globin gene mutations can either carry them in cis (both on the same chromosome, denoted as "aa/--") or in trans (on different chromosomes, denoted as "a-/a-") .  We discussed that alpha thalassemia carriers of two mutations who have African ancestry are more likely to have a trans arrangements (a-/a-), and individuals with Asian ancestry who are alpha thalassemia carriers usually have a cis arrangement (aa/--) of their alpha globin gene mutations.  Alpha-thalassemia carriers with cis configuration is reported to be rare in individuals with African ancestry. Given that the father of the pregnancy has no known family history of alpha thalassemia and no consanguinity to Ms. Gasparro, he would have the general population risk to be an alpha thalassemia carrier.    The most severe form of alpha thalassemia, hydrops fetalis with Hb Barts, is associated with an absence of alpha globin chain synthesis as a result of deletions of all four alpha globin genes (--/--).  There would only be a chance for this if both parents are carriers of alpha thalassemia trait in cis. Hemoglobin H (HbH) disease is caused by three deleted or dysfunctioning alpha globin alleles (a-/--) and is characterized by microcytic hypochromic hemolytic anemia, hepatosplenomegaly, mild jaundice, and sometimes thalassemia-like bone changes.  If Ms. Tosh is a carrier for two alpha globin mutations, we discussed that she would be more likely to carry them in trans configuration (a-/a-), given her ethnicity. If both parents are carriers in the trans configuration, then a pregnancy is not at increased  risk for Hb H disease or Hb Barts. We discussed the option of molecular testing for Ms. Paradis to confirm her alpha thalassemia carrier status and to determine cis vs trans. She elected to pursue this today. She indicated that the father of the pregnancy would not likely be available for carrier screening.  Ms. Joleen Stuckert was provided with written information regarding cystic fibrosis (CF), spinal muscular atrophy (SMA) and hemoglobinopathies including the carrier frequency, availability of carrier screening and prenatal diagnosis if indicated.  In addition, we discussed that CF and hemoglobinopathies are routinely screened for as part of the [facility] newborn screening panel.  After further discussion, she elected to pursue SMA carrier screening and declined CF carrier screening today.  Both family histories were reviewed and found to be noncontributory for birth defects, intellectual disability, and known genetic conditions. Without further information regarding the provided family history, an accurate genetic risk cannot be calculated. Further genetic counseling is warranted if more information is obtained.   The father of the pregnancy is reported to be 36 years old. Advanced paternal age (APA) is defined as paternal age greater than or equal to age 29.  Recent large-scale sequencing studies have shown that approximately 80% of de novo point mutations are of paternal origin.  Many studies have demonstrated a strong correlation between increased paternal age and de novo point mutations.  Although no specific data is available regarding fetal risks for fathers 63+ years old at conception, it is apparent  that the overall risk for single gene conditions is increased.  To estimate the relative increase in risk of a genetic disorder with APA, the heritability of the disease must be considered.  Assuming an approximate 2x increase in risk for conditions that are exclusively paternal in origin, the risk for each  individual condition is still relatively low.  It is estimated that the overall chance for a de novo mutation is ~0.5%.  We also discussed the wide range of conditions which can be caused by new dominant gene mutations (achondroplasia, neurofibromatosis, Marfan syndrome etc.).  Genetic testing for each individual single gene condition is not warranted or available unless ultrasound or family history concerns lend suspicion to a specific condition.    Ms. Berglin denied exposure to environmental toxins or chemical agents. She denied the use of alcohol, tobacco or street drugs. She denied significant viral illnesses during the course of her pregnancy. Her medical and surgical histories were noncontributory.     I counseled Ms. Aliene Beams regarding the above risks and available options. Most of the counseling was provided by Janit Pagan, [employer] genetic counseling student, under my direct supervision.The approximate face-to-face time with the genetic counselor was 40 minutes.    Quinn Plowman, MS,  Certified Genetic Counselor 03/15/2016

## 2016-03-16 ENCOUNTER — Telehealth (HOSPITAL_COMMUNITY): Payer: Self-pay | Admitting: MS"

## 2016-03-16 DIAGNOSIS — Z3A30 30 weeks gestation of pregnancy: Secondary | ICD-10-CM | POA: Insufficient documentation

## 2016-03-16 NOTE — Telephone Encounter (Signed)
Called Marissa Lowe to discuss her prenatal cell free DNA test results.  Ms. Troylynn Maloy had Panorama testing through [facility].  Testing was offered because of maternal age and Quad screen results.   The patient was identified by name and DOB.  We reviewed that these are within normal limits, showing a less than 1 in 10,000 risk for trisomies 21, 18 and 13, and monosomy X (Turner syndrome).  In addition, the risk for triploidy and sex chromosome trisomies (47,XXX and 47,XXY) was also low risk.  We reviewed that this testing identifies > 99% of pregnancies with trisomy 62, trisomy 22, sex chromosome trisomies (47,XXX and 47,XXY), and triploidy. The detection rate for trisomy 18 is 96%.  The detection rate for monosomy X is ~92%.  The false positive rate is <0.1% for all conditions. Testing was also consistent with female fetal sex. She understands that this testing does not identify all genetic conditions.  All questions were answered to her satisfaction, she was encouraged to call with additional questions or concerns.  Quinn Plowman, MS Certified Genetic Counselor 03/16/2016 4:01 PM

## 2016-03-16 NOTE — Telephone Encounter (Signed)
Attempted to call patient regarding results of noninvasive prenatal screening (NIPS), which are within normal range. Left message for patient to return call.   Marissa Lowe 03/16/2016 1:08 PM

## 2016-03-18 ENCOUNTER — Other Ambulatory Visit (HOSPITAL_COMMUNITY): Payer: Self-pay

## 2016-03-19 ENCOUNTER — Encounter: Payer: Self-pay | Admitting: *Deleted

## 2016-03-19 ENCOUNTER — Telehealth (HOSPITAL_COMMUNITY): Payer: Self-pay | Admitting: MS"

## 2016-03-19 ENCOUNTER — Ambulatory Visit (INDEPENDENT_AMBULATORY_CARE_PROVIDER_SITE_OTHER): Payer: Medicaid Other | Admitting: Certified Nurse Midwife

## 2016-03-19 VITALS — BP 138/87 | HR 94 | Temp 98.5°F | Wt >= 6400 oz

## 2016-03-19 DIAGNOSIS — Z3403 Encounter for supervision of normal first pregnancy, third trimester: Secondary | ICD-10-CM

## 2016-03-19 LAB — POCT URINALYSIS DIPSTICK
Bilirubin, UA: NEGATIVE
Blood, UA: NEGATIVE
GLUCOSE UA: NEGATIVE
KETONES UA: NEGATIVE
Leukocytes, UA: NEGATIVE
Nitrite, UA: NEGATIVE
Protein, UA: NEGATIVE
Spec Grav, UA: <=1.005
Urobilinogen, UA: NEGATIVE
pH, UA: 7

## 2016-03-19 NOTE — Progress Notes (Signed)
Subjective:    Marissa Lowe is a 35 y.o. female being seen today for her obstetrical visit. She is at [redacted]w[redacted]d gestation. Patient reports no bleeding, no leaking, occasional contractions and lower leg edema, having difficulty at work because of standing, bending. Fetal movement: normal.  Problem List Items Addressed This Visit    None    Visit Diagnoses    Encounter for supervision of normal first pregnancy in third trimester    -  Primary   Relevant Orders   POCT urinalysis dipstick (Completed)     Patient Active Problem List   Diagnosis Date Noted  . [redacted] weeks gestation of pregnancy   . Advanced maternal age, 1st pregnancy 03/15/2016  . Abnormal maternal serum screening test 03/15/2016  . Morbid obesity with BMI of 50.0-59.9, adult (HCC) 05/09/2014  . Unspecified vitamin D deficiency 03/02/2013  . RBC microcytosis 03/02/2013  . Allergic rhinitis, cause unspecified 03/02/2013   Objective:    BP 138/87   Pulse 94   Temp 98.5 F (36.9 C)   Wt (!) 416 lb (188.7 kg)   LMP 08/21/2015   BMI 73.69 kg/m  FHT:  135 BPM  Uterine Size: unable to determine d/t body habitus  Presentation: transverse by [place]     Assessment:    Pregnancy @ [redacted]w[redacted]d weeks   Lower leg edema  Morbid maternal body habitus  Plan:     labs reviewed, problem list updated Consent signed. GBS sent TDAP offered  Rhogam given for RH negative Pediatrician: discussed. Infant feeding: plans to breastfeed. Maternity leave: discussed, forms done. Cigarette smoking: never smoked. Orders Placed This Encounter  Procedures  . POCT urinalysis dipstick   No orders of the defined types were placed in this encounter.  Follow up in 2 Weeks.

## 2016-03-19 NOTE — Telephone Encounter (Signed)
Left message for patient to return call regarding additional results from her visit last week.   Marissa Lowe 03/19/2016 11:56 AM

## 2016-03-19 NOTE — Telephone Encounter (Signed)
Ms. Vasiliou returned my call. Discussed that I was calling regarding results of hemoglobinopathy carrier screening, which confirmed patient has hemoglobin C trait, as we discussed during her visit. Additionally, this molecular testing clarified her status for alpha thalassemia, and she was found to carry one deletion, denoted as -a/aa. This is also sometimes referred to as being a "silent carrier." We reviewed that the pregnancy would only be at risk to inherit these conditions if the father of the pregnancy is also a carrier. In the case of alpha thalassemia, there would not be expected to be a risk for Hemoglobin Bart disease, and only a risk for Hemoglobin H disease if the father of the pregnancy carried two deletions in cis (aa/--).   Additionally, carrier screening for spinal muscular atrophy visualized two copies of SMN1, which is within normal range. Thus, her risk to be a carrier for SMA has been reduced to be 1 in 400.   All questions were answered to her satisfaction at this time.   Clydie Braun Amilio Zehnder 03/19/2016 2:20 PM

## 2016-03-23 ENCOUNTER — Encounter: Payer: Medicaid Other | Admitting: Certified Nurse Midwife

## 2016-03-25 ENCOUNTER — Other Ambulatory Visit (HOSPITAL_COMMUNITY): Payer: Self-pay

## 2016-03-26 ENCOUNTER — Encounter: Payer: Self-pay | Admitting: *Deleted

## 2016-04-06 ENCOUNTER — Ambulatory Visit (INDEPENDENT_AMBULATORY_CARE_PROVIDER_SITE_OTHER): Payer: Medicaid Other | Admitting: Obstetrics and Gynecology

## 2016-04-06 VITALS — BP 138/84 | HR 112 | Temp 98.7°F | Wt >= 6400 oz

## 2016-04-06 DIAGNOSIS — O289 Unspecified abnormal findings on antenatal screening of mother: Secondary | ICD-10-CM

## 2016-04-06 DIAGNOSIS — O280 Abnormal hematological finding on antenatal screening of mother: Secondary | ICD-10-CM

## 2016-04-06 DIAGNOSIS — Z3403 Encounter for supervision of normal first pregnancy, third trimester: Secondary | ICD-10-CM

## 2016-04-06 DIAGNOSIS — O09513 Supervision of elderly primigravida, third trimester: Secondary | ICD-10-CM

## 2016-04-06 LAB — POCT URINALYSIS DIPSTICK
Bilirubin, UA: NEGATIVE
Glucose, UA: NEGATIVE
Leukocytes, UA: NEGATIVE
Nitrite, UA: NEGATIVE
SPEC GRAV UA: 1.02
Urobilinogen, UA: 0.2
pH, UA: 5

## 2016-04-06 NOTE — Progress Notes (Addendum)
Subjective:  Marissa Lowe is a 35 y.o. G1P0000 at 9380w1d being seen today for ongoing prenatal care.  She is currently monitored for the following issues for this low-risk pregnancy and has RBC microcytosis; Allergic rhinitis, cause unspecified; Morbid obesity with BMI of 50.0-59.9, adult (HCC); Advanced maternal age, 1st pregnancy; Abnormal maternal serum screening test; and Supervision of elderly primigravida in third trimester on her problem list.  Patient reports generalized pruritis for the past 2 weeks.  Contractions: Not present. Vag. Bleeding: None.  Movement: Present. Denies leaking of fluid.   The following portions of the patient's history were reviewed and updated as appropriate: allergies, current medications, past family history, past medical history, past social history, past surgical history and problem list. Problem list updated.  Objective:   Vitals:   04/06/16 0950  BP: 138/84  Pulse: (!) 112  Temp: 98.7 F (37.1 C)  Weight: (!) 414 lb 12.8 oz (188.2 kg)    Fetal Status: Fetal Heart Rate (bpm): 145 Fundal Height: 36 cm Movement: Present     General:  Alert, oriented and cooperative. Patient is in no acute distress.  Skin: Skin is warm and dry. No rash noted.   Cardiovascular: Normal heart rate noted  Respiratory: Normal respiratory effort, no problems with respiration noted  Abdomen: Soft, gravid, appropriate for gestational age. Pain/Pressure: Present     Pelvic:  Cervical exam deferred        Extremities: Normal range of motion.  Edema: None  Mental Status: Normal mood and affect. Normal behavior. Normal judgment and thought content.   Urinalysis: Urine Protein: Trace Urine Glucose: Negative  Assessment and Plan:  Pregnancy: G1P0000 at 3180w1d  1. Encounter for supervision of normal first pregnancy in third trimester - POCT Urinalysis Dipstick  2. Advanced maternal age, 1st pregnancy, third trimester Normal NIPS per patient reports  3. Supervision of elderly  primigravida in third trimester Bile acids ordered to rule out ICP. Advised to use benadryl for the pruritis in the meantime Follow up growth ultrasound on 8/25 Cultures next visit BTL consent signed today  4. Abnormal maternal serum screening test Normal NIPS per patient reports  Preterm labor symptoms and general obstetric precautions including but not limited to vaginal bleeding, contractions, leaking of fluid and fetal movement were reviewed in detail with the patient. Please refer to After Visit Summary for other counseling recommendations.  No Follow-up on file.   Catalina AntiguaPeggy Quiana Cobaugh, MD

## 2016-04-06 NOTE — Addendum Note (Signed)
Addended by: Catalina AntiguaONSTANT, Medea Deines on: 04/06/2016 10:32 AM   Modules accepted: Orders

## 2016-04-06 NOTE — Progress Notes (Signed)
Pt c/o vaginal pressure 

## 2016-04-07 LAB — BILE ACIDS, TOTAL: Bile Acids Total: 8.1 umol/L (ref 4.7–24.5)

## 2016-04-16 ENCOUNTER — Encounter (HOSPITAL_COMMUNITY): Payer: Self-pay

## 2016-04-16 ENCOUNTER — Ambulatory Visit (HOSPITAL_COMMUNITY)
Admission: RE | Admit: 2016-04-16 | Discharge: 2016-04-16 | Disposition: A | Discharge status: Home / Self Care | Payer: Medicaid Other | Source: Ambulatory Visit | Attending: Certified Nurse Midwife | Admitting: Certified Nurse Midwife

## 2016-04-16 DIAGNOSIS — O99213 Obesity complicating pregnancy, third trimester: Secondary | ICD-10-CM

## 2016-04-16 DIAGNOSIS — Z3A35 35 weeks gestation of pregnancy: Secondary | ICD-10-CM | POA: Diagnosis not present

## 2016-04-16 DIAGNOSIS — O09513 Supervision of elderly primigravida, third trimester: Secondary | ICD-10-CM | POA: Insufficient documentation

## 2016-04-16 DIAGNOSIS — O280 Abnormal hematological finding on antenatal screening of mother: Secondary | ICD-10-CM

## 2016-04-16 DIAGNOSIS — O283 Abnormal ultrasonic finding on antenatal screening of mother: Secondary | ICD-10-CM | POA: Insufficient documentation

## 2016-04-16 DIAGNOSIS — O99210 Obesity complicating pregnancy, unspecified trimester: Secondary | ICD-10-CM | POA: Diagnosis not present

## 2016-04-20 ENCOUNTER — Ambulatory Visit (INDEPENDENT_AMBULATORY_CARE_PROVIDER_SITE_OTHER): Payer: Medicaid Other | Admitting: Obstetrics and Gynecology

## 2016-04-20 VITALS — BP 138/83 | HR 86 | Temp 98.2°F | Wt >= 6400 oz

## 2016-04-20 DIAGNOSIS — Z6841 Body Mass Index (BMI) 40.0 and over, adult: Secondary | ICD-10-CM

## 2016-04-20 DIAGNOSIS — R8271 Bacteriuria: Secondary | ICD-10-CM

## 2016-04-20 DIAGNOSIS — O09513 Supervision of elderly primigravida, third trimester: Secondary | ICD-10-CM

## 2016-04-20 DIAGNOSIS — Z3403 Encounter for supervision of normal first pregnancy, third trimester: Secondary | ICD-10-CM

## 2016-04-20 DIAGNOSIS — Z029 Encounter for administrative examinations, unspecified: Secondary | ICD-10-CM

## 2016-04-20 LAB — POCT URINALYSIS DIPSTICK
Bilirubin, UA: NEGATIVE
GLUCOSE UA: NEGATIVE
Ketones, UA: NEGATIVE
NITRITE UA: NEGATIVE
RBC UA: NEGATIVE
Spec Grav, UA: 1.025
UROBILINOGEN UA: 0.2
pH, UA: 5

## 2016-04-20 NOTE — Progress Notes (Signed)
Patient has no concerns at this time

## 2016-04-20 NOTE — Patient Instructions (Signed)
External Cephalic Version External cephalic version is turning a baby that is presenting his or her buttocks first (breech) or is lying sideways in the uterus (transverse) to a head-first position. This makes the labor and delivery faster, safer for the mother and baby, and lessens the chance for a cesarean section. It should not be tried until the pregnancy is [redacted] weeks along or longer. BEFORE THE PROCEDURE   Do not take aspirin.  Do not eat for 4 hours before the procedure.  Tell your caregiver if you have a cold, fever, or an infection.  Tell your caregiver if you are having contractions.  Tell your caregiver if you are leaking or had a gush of fluid from your vagina.  Tell your caregiver if you have any vaginal bleeding or abnormal discharge.  If you are being admitted the same day, arrive at the hospital at least one hour before the procedure to sign any necessary documents and to get prepared for the procedure.  Tell your caregiver if you had any problems with anesthetics in the past.  Tell your caregiver if you are taking any medications that your caregiver does not know about. This includes over-the-counter and prescription drugs, herbs, eye drops and creams. PROCEDURE  First, an ultrasound is done to make sure the baby is breech or transverse.  A non-stress test or biophysical profile is done on the baby before the ECV. This is done to make sure it is safe for the baby to have the ECV. It may also be done after the procedure to make sure the baby is okay.  ECV is done in the delivery/surgical room with an anesthesiologist present. There should be a setup for an emergency cesarean section with a full nursing and nursery staff available and ready.  The patient may be given a medication to relax the uterine muscles. An epidural may be given for any discomfort. It is helpful for the success of the ECV.  An electronic fetal monitor is placed on the uterus during the procedure to  make sure the baby is okay.  If the mother is Rh-negative, Rho (D) immune globulin will be given to her to prevent Rh problems for future pregnancies.  The mother is followed closely for 2 to 3 hours after the procedure to make sure no problems develop. BENEFITS OF ECV  Easier and safer labor and delivery for the mother and baby.  Lower incidence of cesarean section.  Lower costs with a vaginal delivery. RISKS OF ECV  The placenta pulls away from the wall of the uterus before delivery (abruption of the placenta).  Rupture of the uterus, especially in patients with a previous cesarean section.  Fetal distress.  Early (premature) labor.  Premature rupture of the membranes.  The baby will return to the breech or transverse lie position.  Death of the fetus can happen but is very rare. ECV SHOULD BE STOPPED IF:  The fetal heart tones drop.  The mother is having a lot of pain.  You cannot turn the baby after several attempts. ECV SHOULD NOT BE DONE IF:  The non-stress test or biophysical profile is abnormal.  There is vaginal bleeding.  An abnormal shaped uterus is present.  There is heart disease or uncontrolled high blood pressure in the mother.  There are twins or more.  The placenta covers the opening of the cervix (placenta previa).  You had a previous cesarean section with a classical incision or major surgery of the uterus.  There   is not enough amniotic fluid in the sac (oligohydramnios). °· The baby is too small for the pregnancy or has not developed normally (anomaly). °· Your membranes have ruptured. °HOME CARE INSTRUCTIONS  °· Have someone take you home after the procedure. °· Rest at home for several hours. °· Have someone stay with you for a few hours after you get home. °· After ECV, continue with your prenatal visits as directed. °· Continue your regular diet, rest and activities. °· Do not do any strenuous activities for a couple of days. °SEEK IMMEDIATE  MEDICAL CARE IF:  °· You develop vaginal bleeding. °· You have fluid coming out of your vagina (bag of water may have broken). °· You develop uterine contractions. °· You do not feel the baby move or there is less movement of the baby. °· You develop abdominal pain. °· You develop an oral temperature of 102° F (38.9° C) or higher. °  °This information is not intended to replace advice given to you by your health care provider. Make sure you discuss any questions you have with your health care provider. °  °Document Released: 02/01/2007 Document Revised: 08/30/2014 Document Reviewed: 11/27/2008 °Elsevier Interactive Patient Education ©2016 [employer] ° °

## 2016-04-20 NOTE — Progress Notes (Signed)
   PRENATAL VISIT NOTE  Subjective:  Marissa Lowe is a 35 y.o. G1P0000 at 1427w1d being seen today for ongoing prenatal care.  She is currently monitored for the following issues for this low-risk pregnancy and has RBC microcytosis; Allergic rhinitis, cause unspecified; Morbid obesity with BMI of 50.0-59.9, adult (HCC); Advanced maternal age, 1st pregnancy; Abnormal maternal serum screening test; Supervision of elderly primigravida in third trimester; and GBS bacteriuria on her problem list.  Patient reports no complaints.  Contractions: Not present. Vag. Bleeding: None.  Movement: Present. Denies leaking of fluid.   The following portions of the patient's history were reviewed and updated as appropriate: allergies, current medications, past family history, past medical history, past social history, past surgical history and problem list. Problem list updated.  Objective:   Vitals:   04/20/16 1611  BP: (!) 148/93  Pulse: 86  Temp: 98.2 F (36.8 C)  Weight: (!) 415 lb 12.8 oz (188.6 kg)    Fetal Status: Fetal Heart Rate (bpm): 144 Fundal Height: 36 cm Movement: Present     General:  Alert, oriented and cooperative. Patient is in no acute distress.  Skin: Skin is warm and dry. No rash noted.   Cardiovascular: Normal heart rate noted  Respiratory: Normal respiratory effort, no problems with respiration noted  Abdomen: Soft, gravid, appropriate for gestational age. Pain/Pressure: Present     Pelvic:  Cervical exam deferred        Extremities: Normal range of motion.  Edema: None  Mental Status: Normal mood and affect. Normal behavior. Normal judgment and thought content.   Urinalysis:      Assessment and Plan:  Pregnancy: G1P0000 at 3427w1d  1. Encounter for supervision of normal first pregnancy in third trimester - POCT Urinalysis Dipstick  2. Supervision of elderly primigravida in third trimester Patient is doing well Discussed results of recent ultrasound which demonstrated the  fetus in transverse position. Patient is interested in ECV. Will schedule at 37 weeks or soon after - GC/Chlamydia Probe Amp  3. GBS bacteriuria Will receive prophylaxis in labor  4. Morbid obesity with BMI of 50.0-59.9, adult (HCC)   Preterm labor symptoms and general obstetric precautions including but not limited to vaginal bleeding, contractions, leaking of fluid and fetal movement were reviewed in detail with the patient. Please refer to After Visit Summary for other counseling recommendations.  Return in about 1 week (around 04/27/2016).  Catalina AntiguaPeggy Kofi Murrell, MD

## 2016-04-21 ENCOUNTER — Encounter (HOSPITAL_COMMUNITY): Payer: Self-pay | Admitting: *Deleted

## 2016-04-21 ENCOUNTER — Telehealth (HOSPITAL_COMMUNITY): Payer: Self-pay | Admitting: *Deleted

## 2016-04-21 NOTE — Telephone Encounter (Signed)
Preadmission screen  

## 2016-04-22 LAB — GC/CHLAMYDIA PROBE AMP
CHLAMYDIA, DNA PROBE: NEGATIVE
NEISSERIA GONORRHOEAE BY PCR: NEGATIVE

## 2016-04-23 LAB — HM HEPATITIS C SCREENING LAB: HM Hepatitis Screen: NEGATIVE

## 2016-04-30 ENCOUNTER — Encounter (HOSPITAL_COMMUNITY): Payer: Self-pay

## 2016-04-30 ENCOUNTER — Observation Stay (HOSPITAL_COMMUNITY)
Admission: RE | Admit: 2016-04-30 | Discharge: 2016-04-30 | Disposition: A | Discharge status: Home / Self Care | Payer: Medicaid Other | Source: Ambulatory Visit | Attending: Family Medicine | Admitting: Family Medicine

## 2016-04-30 DIAGNOSIS — O280 Abnormal hematological finding on antenatal screening of mother: Secondary | ICD-10-CM

## 2016-04-30 DIAGNOSIS — Z3A37 37 weeks gestation of pregnancy: Secondary | ICD-10-CM | POA: Insufficient documentation

## 2016-04-30 DIAGNOSIS — O321XX0 Maternal care for breech presentation, not applicable or unspecified: Secondary | ICD-10-CM | POA: Diagnosis present

## 2016-04-30 DIAGNOSIS — Z3483 Encounter for supervision of other normal pregnancy, third trimester: Secondary | ICD-10-CM | POA: Diagnosis not present

## 2016-04-30 DIAGNOSIS — R8271 Bacteriuria: Secondary | ICD-10-CM

## 2016-04-30 DIAGNOSIS — O09513 Supervision of elderly primigravida, third trimester: Secondary | ICD-10-CM

## 2016-04-30 NOTE — Discharge Summary (Signed)
Pt was admitted for a version. She was found to be vertex on Ultrasound and discharged home.

## 2016-04-30 NOTE — Discharge Instructions (Signed)
Fetal Movement Counts  Patient Name: __________________________________________________ Patient Due Date: ____________________  Performing a fetal movement count is highly recommended in high-risk pregnancies, but it is good for every pregnant woman to do. Your health care provider may ask you to start counting fetal movements at 28 weeks of the pregnancy. Fetal movements often increase:  · After eating a full meal.  · After physical activity.  · After eating or drinking something sweet or cold.  · At rest.  Pay attention to when you feel the baby is most active. This will help you notice a pattern of your baby's sleep and wake cycles and what factors contribute to an increase in fetal movement. It is important to perform a fetal movement count at the same time each day when your baby is normally most active.   HOW TO COUNT FETAL MOVEMENTS  1. Find a quiet and comfortable area to sit or lie down on your left side. Lying on your left side provides the best blood and oxygen circulation to your baby.  2. Write down the day and time on a sheet of paper or in a journal.  3. Start counting kicks, flutters, swishes, rolls, or jabs in a 2-hour period. You should feel at least 10 movements within 2 hours.  4. If you do not feel 10 movements in 2 hours, wait 2-3 hours and count again. Look for a change in the pattern or not enough counts in 2 hours.  SEEK MEDICAL CARE IF:  · You feel less than 10 counts in 2 hours, tried twice.  · There is no movement in over an hour.  · The pattern is changing or taking longer each day to reach 10 counts in 2 hours.  · You feel the baby is not moving as he or she usually does.  Date: ____________ Movements: ____________ Start time: ____________ Finish time: ____________   Date: ____________ Movements: ____________ Start time: ____________ Finish time: ____________  Date: ____________ Movements: ____________ Start time: ____________ Finish time: ____________  Date: ____________ Movements:  ____________ Start time: ____________ Finish time: ____________  Date: ____________ Movements: ____________ Start time: ____________ Finish time: ____________  Date: ____________ Movements: ____________ Start time: ____________ Finish time: ____________  Date: ____________ Movements: ____________ Start time: ____________ Finish time: ____________  Date: ____________ Movements: ____________ Start time: ____________ Finish time: ____________   Date: ____________ Movements: ____________ Start time: ____________ Finish time: ____________  Date: ____________ Movements: ____________ Start time: ____________ Finish time: ____________  Date: ____________ Movements: ____________ Start time: ____________ Finish time: ____________  Date: ____________ Movements: ____________ Start time: ____________ Finish time: ____________  Date: ____________ Movements: ____________ Start time: ____________ Finish time: ____________  Date: ____________ Movements: ____________ Start time: ____________ Finish time: ____________  Date: ____________ Movements: ____________ Start time: ____________ Finish time: ____________   Date: ____________ Movements: ____________ Start time: ____________ Finish time: ____________  Date: ____________ Movements: ____________ Start time: ____________ Finish time: ____________  Date: ____________ Movements: ____________ Start time: ____________ Finish time: ____________  Date: ____________ Movements: ____________ Start time: ____________ Finish time: ____________  Date: ____________ Movements: ____________ Start time: ____________ Finish time: ____________  Date: ____________ Movements: ____________ Start time: ____________ Finish time: ____________  Date: ____________ Movements: ____________ Start time: ____________ Finish time: ____________   Date: ____________ Movements: ____________ Start time: ____________ Finish time: ____________  Date: ____________ Movements: ____________ Start time: ____________ Finish  time: ____________  Date: ____________ Movements: ____________ Start time: ____________ Finish time: ____________  Date: ____________ Movements: ____________ Start time:   ____________ Finish time: ____________  Date: ____________ Movements: ____________ Start time: ____________ Finish time: ____________  Date: ____________ Movements: ____________ Start time: ____________ Finish time: ____________  Date: ____________ Movements: ____________ Start time: ____________ Finish time: ____________   Date: ____________ Movements: ____________ Start time: ____________ Finish time: ____________  Date: ____________ Movements: ____________ Start time: ____________ Finish time: ____________  Date: ____________ Movements: ____________ Start time: ____________ Finish time: ____________  Date: ____________ Movements: ____________ Start time: ____________ Finish time: ____________  Date: ____________ Movements: ____________ Start time: ____________ Finish time: ____________  Date: ____________ Movements: ____________ Start time: ____________ Finish time: ____________  Date: ____________ Movements: ____________ Start time: ____________ Finish time: ____________   Date: ____________ Movements: ____________ Start time: ____________ Finish time: ____________  Date: ____________ Movements: ____________ Start time: ____________ Finish time: ____________  Date: ____________ Movements: ____________ Start time: ____________ Finish time: ____________  Date: ____________ Movements: ____________ Start time: ____________ Finish time: ____________  Date: ____________ Movements: ____________ Start time: ____________ Finish time: ____________  Date: ____________ Movements: ____________ Start time: ____________ Finish time: ____________  Date: ____________ Movements: ____________ Start time: ____________ Finish time: ____________   Date: ____________ Movements: ____________ Start time: ____________ Finish time: ____________  Date: ____________  Movements: ____________ Start time: ____________ Finish time: ____________  Date: ____________ Movements: ____________ Start time: ____________ Finish time: ____________  Date: ____________ Movements: ____________ Start time: ____________ Finish time: ____________  Date: ____________ Movements: ____________ Start time: ____________ Finish time: ____________  Date: ____________ Movements: ____________ Start time: ____________ Finish time: ____________  Date: ____________ Movements: ____________ Start time: ____________ Finish time: ____________   Date: ____________ Movements: ____________ Start time: ____________ Finish time: ____________  Date: ____________ Movements: ____________ Start time: ____________ Finish time: ____________  Date: ____________ Movements: ____________ Start time: ____________ Finish time: ____________  Date: ____________ Movements: ____________ Start time: ____________ Finish time: ____________  Date: ____________ Movements: ____________ Start time: ____________ Finish time: ____________  Date: ____________ Movements: ____________ Start time: ____________ Finish time: ____________     This information is not intended to replace advice given to you by your health care provider. Make sure you discuss any questions you have with your health care provider.     Document Released: 09/08/2006 Document Revised: 08/30/2014 Document Reviewed: 06/05/2012  Elsevier Interactive Patient Education ©2016 [employer]

## 2016-04-30 NOTE — Progress Notes (Signed)
Dr Penne LashLeggett at bedside to confirm presentation prior to version--found to be vertex--awaiting discharge orders

## 2016-05-06 ENCOUNTER — Ambulatory Visit (INDEPENDENT_AMBULATORY_CARE_PROVIDER_SITE_OTHER): Payer: Medicaid Other | Admitting: Certified Nurse Midwife

## 2016-05-06 DIAGNOSIS — O09513 Supervision of elderly primigravida, third trimester: Secondary | ICD-10-CM

## 2016-05-06 NOTE — Progress Notes (Signed)
Patient stated that she has been feeling constant lower pressure for the last 2 weeks, but she is not feeling contractions.

## 2016-05-06 NOTE — Progress Notes (Signed)
Subjective:    Marissa Lowe is a 35 y.o. female being seen today for her obstetrical visit. She is at 7870w3d gestation. Patient reports backache, no bleeding, no contractions, no cramping and no leaking. Fetal movement: normal.  Problem List Items Addressed This Visit      Other   Supervision of elderly primigravida in third trimester   Relevant Orders   US MFM OB FOLLOW UP   AMB referral to maternal fetal medicine    Other Visit Diagnoses   None.    Patient Active Problem List   Diagnosis Date Noted  . Breech presentation on examination 04/30/2016  . GBS bacteriuria 04/20/2016  . Supervision of elderly primigravida in third trimester 04/06/2016  . Advanced maternal age, 1st pregnancy 03/15/2016  . Abnormal maternal serum screening test 03/15/2016  . Morbid obesity with BMI of 50.0-59.9, adult (HCC) 05/09/2014  . RBC microcytosis 03/02/2013  . Allergic rhinitis, cause unspecified 03/02/2013    Objective:    BP (!) 143/87   Pulse (!) 103   Temp 98.6 F (37 C)   Wt (!) 433 lb 9.6 oz (196.7 kg)   LMP 08/21/2015   BMI 76.81 kg/m  FHT: 156 BPM  Uterine Size: unable to determine d/t maternal morbid obesity  Presentations: unsure  Pelvic Exam: deferred     Assessment:    Pregnancy @ 4870w3d weeks   Morbid maternal obesity  AMA: [age]yrs old Plan:    US @ MFM: delivery plans, growth, AFI Plans for delivery: Vaginal anticipated; labs reviewed; problem list updated Counseling: Consent signed. Infant feeding: plans to breastfeed. Cigarette smoking: never smoked. L&D discussion: symptoms of labor, discussed when to call, discussed what number to call, anesthetic/analgesic options reviewed and delivering clinician:  plans no preference. Postpartum supports and preparation: circumcision discussed and contraception plans discussed.  Follow up in 1 Week.

## 2016-05-06 NOTE — Patient Instructions (Addendum)
Third Trimester of Pregnancy The third trimester is from week 29 through week 42, months 7 through 9. The third trimester is a time when the fetus is growing rapidly. At the end of the ninth month, the fetus is about 20 inches in length and weighs 6-10 pounds.  BODY CHANGES Your body goes through many changes during pregnancy. The changes vary from woman to woman.   Your weight will continue to increase. You can expect to gain 25-35 pounds (11-16 kg) by the end of the pregnancy.  You may begin to get stretch marks on your hips, abdomen, and breasts.  You may urinate more often because the fetus is moving lower into your pelvis and pressing on your bladder.  You may develop or continue to have heartburn as a result of your pregnancy.  You may develop constipation because certain hormones are causing the muscles that push waste through your intestines to slow down.  You may develop hemorrhoids or swollen, bulging veins (varicose veins).  You may have pelvic pain because of the weight gain and pregnancy hormones relaxing your joints between the bones in your pelvis. Backaches may result from overexertion of the muscles supporting your posture.  You may have changes in your hair. These can include thickening of your hair, rapid growth, and changes in texture. Some women also have hair loss during or after pregnancy, or hair that feels dry or thin. Your hair will most likely return to normal after your baby is born.  Your breasts will continue to grow and be tender. A yellow discharge may leak from your breasts called colostrum.  Your belly button may stick out.  You may feel short of breath because of your expanding uterus.  You may notice the fetus "dropping," or moving lower in your abdomen.  You may have a bloody mucus discharge. This usually occurs a few days to a week before labor begins.  Your cervix becomes thin and soft (effaced) near your due date. WHAT TO EXPECT AT YOUR PRENATAL  EXAMS  You will have prenatal exams every 2 weeks until week 36. Then, you will have weekly prenatal exams. During a routine prenatal visit:  You will be weighed to make sure you and the fetus are growing normally.  Your blood pressure is taken.  Your abdomen will be measured to track your baby's growth.  The fetal heartbeat will be listened to.  Any test results from the previous visit will be discussed.  You may have a cervical check near your due date to see if you have effaced. At around 36 weeks, your caregiver will check your cervix. At the same time, your caregiver will also perform a test on the secretions of the vaginal tissue. This test is to determine if a type of bacteria, Group B streptococcus, is present. Your caregiver will explain this further. Your caregiver may ask you:  What your birth plan is.  How you are feeling.  If you are feeling the baby move.  If you have had any abnormal symptoms, such as leaking fluid, bleeding, severe headaches, or abdominal cramping.  If you are using any tobacco products, including cigarettes, chewing tobacco, and electronic cigarettes.  If you have any questions. Other tests or screenings that may be performed during your third trimester include:  Blood tests that check for low iron levels (anemia).  Fetal testing to check the health, activity level, and growth of the fetus. Testing is done if you have certain medical conditions or if   there are problems during the pregnancy.  HIV (human immunodeficiency virus) testing. If you are at high risk, you may be screened for HIV during your third trimester of pregnancy. FALSE LABOR You may feel small, irregular contractions that eventually go away. These are called Braxton Hicks contractions, or false labor. Contractions may last for hours, days, or even weeks before true labor sets in. If contractions come at regular intervals, intensify, or become painful, it is best to be seen by your  caregiver.  SIGNS OF LABOR   Menstrual-like cramps.  Contractions that are 5 minutes apart or less.  Contractions that start on the top of the uterus and spread down to the lower abdomen and back.  A sense of increased pelvic pressure or back pain.  A watery or bloody mucus discharge that comes from the vagina. If you have any of these signs before the 37th week of pregnancy, call your caregiver right away. You need to go to the hospital to get checked immediately. HOME CARE INSTRUCTIONS   Avoid all smoking, herbs, alcohol, and unprescribed drugs. These chemicals affect the formation and growth of the baby.  Do not use any tobacco products, including cigarettes, chewing tobacco, and electronic cigarettes. If you need help quitting, ask your health care provider. You may receive counseling support and other resources to help you quit.  Follow your caregiver's instructions regarding medicine use. There are medicines that are either safe or unsafe to take during pregnancy.  Exercise only as directed by your caregiver. Experiencing uterine cramps is a good sign to stop exercising.  Continue to eat regular, healthy meals.  Wear a good support bra for breast tenderness.  Do not use hot tubs, steam rooms, or saunas.  Wear your seat belt at all times when driving.  Avoid raw meat, uncooked cheese, cat litter boxes, and soil used by cats. These carry germs that can cause birth defects in the baby.  Take your prenatal vitamins.  Take 1500-2000 mg of calcium daily starting at the 20th week of pregnancy until you deliver your baby.  Try taking a stool softener (if your caregiver approves) if you develop constipation. Eat more high-fiber foods, such as fresh vegetables or fruit and whole grains. Drink plenty of fluids to keep your urine clear or pale yellow.  Take warm sitz baths to soothe any pain or discomfort caused by hemorrhoids. Use hemorrhoid cream if your caregiver approves.  If  you develop varicose veins, wear support hose. Elevate your feet for 15 minutes, 3-4 times a day. Limit salt in your diet.  Avoid heavy lifting, wear low heal shoes, and practice good posture.  Rest a lot with your legs elevated if you have leg cramps or low back pain.  Visit your dentist if you have not gone during your pregnancy. Use a soft toothbrush to brush your teeth and be gentle when you floss.  A sexual relationship may be continued unless your caregiver directs you otherwise.  Do not travel far distances unless it is absolutely necessary and only with the approval of your caregiver.  Take prenatal classes to understand, practice, and ask questions about the labor and delivery.  Make a trial run to the hospital.  Pack your hospital bag.  Prepare the baby's nursery.  Continue to go to all your prenatal visits as directed by your caregiver. SEEK MEDICAL CARE IF:  You are unsure if you are in labor or if your water has broken.  You have dizziness.  You have  mild pelvic cramps, pelvic pressure, or nagging pain in your abdominal area.  You have persistent nausea, vomiting, or diarrhea.  You have a bad smelling vaginal discharge.  You have pain with urination. SEEK IMMEDIATE MEDICAL CARE IF:   You have a fever.  You are leaking fluid from your vagina.  You have spotting or bleeding from your vagina.  You have severe abdominal cramping or pain.  You have rapid weight loss or gain.  You have shortness of breath with chest pain.  You notice sudden or extreme swelling of your face, hands, ankles, feet, or legs.  You have not felt your baby move in over an hour.  You have severe headaches that do not go away with medicine.  You have vision changes.   This information is not intended to replace advice given to you by your health care provider. Make sure you discuss any questions you have with your health care provider.   Document Released: 08/03/2001 Document  Revised: 08/30/2014 Document Reviewed: 10/10/2012 Elsevier Interactive Patient Education 2016 [employer]  Pain Relief During Labor and Delivery Everyone experiences pain differently, but labor causes severe pain for many women. The amount of pain you experience during labor and delivery depends on your pain tolerance, contraction strength, and your baby's size and position. There are many ways to prepare for and deal with the pain, including:   Taking prenatal classes to learn about labor and delivery. The more informed you are, the less anxious and afraid you may be. This can help lessen the pain.  Taking pain-relieving medicine during labor and delivery.  Learning breathing and relaxation techniques.  Taking a shower or bath.  Getting massaged.  Changing positions.  Placing an ice pack on your back. Discuss your pain control options with your health care provider during your prenatal visits.  WHAT ARE THE TWO TYPES OF PAIN-RELIEVING MEDICINES? 1. Analgesics. These are medicines that decrease pain without total loss of feeling or muscle movement. 2. Anesthetics. These are medicines that block all feeling, including pain. There can be minor side effects of both types, such as nausea, trouble concentrating, becoming sleepy, and lowering the heart rate of the baby. However, health care providers are careful to give doses that will not seriously affect the baby.  WHAT ARE THE SPECIFIC TYPES OF ANALGESICS AND ANESTHETICS? Systemic Analgesic Systemic pain medicines affect your whole body rather than focusing pain relief on the area of your body experiencing pain. This type of medicine is given either through an IV tube in your vein or by a shot (injection) into your muscle. This medicine will lessen your pain but will not stop it completely. It may also make you sleepy, but it will not make you lose consciousness.  Local Anesthetic Local anesthetic isused tonumb a small area of your  body. The medicine is injected into the area of nerves that carry feeling to the vagina, vulva, or the area between the vagina and anus (perineum).  General Anesthetic This type of medicine causes you to lose consciousness so you do not feel pain. It is usually used only in emergency situations during labor. It is given through an IV tube or face mask. Paracervical Block A paracervical block is a form of local anesthesia given during labor. Numbing medicine is injected into the right and left sides of the cervix and vagina. It helps to lessen the pain caused by contractions and stretching of the cervix. It may have to be given more than once.  Pudendal Block A pudendal block is another form of local anesthesia. It is used to relieve the pain associated with pushing or stretching of the perineum at the time of delivery. An injection is given deep through the vaginal wall into the pudendal nerve in the pelvis, numbing the perineum.  Epidural Anesthetic An epidural is an injection of numbing medicine given in the lower back and into the epidural space near your spinal cord. The epidural numbs the lower half of your body. You may be able to move your legs but will not be allowed to walk. Epidurals can be used for labor, delivery, or cesarean deliveries.  To prevent the medicine from wearing off, a small tube (catheter) may be threaded into the epidural space and taped in place to prevent it from slipping out. Medicine can then be given continuously in small doses through the tube until you deliver. Spinal Block A spinal block is similar to an epidural, but the medicine is injected into the spinal fluid, not the epidural space. A spinal block is only given once. It starts to relieve pain quickly but lasts only 1-2 hours. Spinal blocks can also be used for cesarean deliveries.  Combined Spinal-Epidural Block Combined spinal-epidural blocks combine the benefits of both the spinal and epidural blocks. The  spinal part acts quickly to relieve pain and the epidural provides continuous pain relief. Hydrotherapy Immersion in warm water during labor may provide comfort and relaxation. It may also help to lessen pain, the use of anesthesia, and the length of labor. However, immersion in water during the delivery (water birth) may have some risk involved and studies to determine safety and risks are ongoing. If you are a healthy woman who is expecting an uncomplicated birth, talk with your health care provider to see if water birth is an option for you.    This information is not intended to replace advice given to you by your health care provider. Make sure you discuss any questions you have with your health care provider.   Document Released: 11/25/2008 Document Revised: 08/14/2013 Document Reviewed: 12/28/2012 Elsevier Interactive Patient Education 2016 [employer].  Before Baby Comes Home Before your baby arrives it is important to:  Have all of the supplies that you will need to care for your baby.  Know where to go if there is an emergency.  Discuss the baby's arrival with other family members. WHAT SUPPLIES WILL I NEED? It is recommended that you have the following supplies: Large Items  Crib.  Crib mattress.  Rear-facing infant car seat. If possible, have a trained professional check to make sure that it is installed correctly. Feeding  6-8 bottles that are 4-5 oz in size.  6-8 nipples.  Bottle brush.  Sterilizer, or a large pan or kettle with a lid.  A way to boil and cool water.  If you will be breastfeeding:  Breast pump.  Nipple cream.  Nursing bra.  Breast pads.  Breast shields.  If you will be formula feeding:  Formula.  Measuring cups.  Measuring spoons. Bathing  Mild baby soap and baby shampoo.  Petroleum jelly.  Soft cloth towel and washcloth.  Hooded towel.  Cotton balls.  Bath basin. Other Supplies  Rectal thermometer.  Bulb  syringe.  Baby wipes or washcloths for diaper changes.  Diaper bag.  Changing pad.  Clothing, including one-piece outfits and pajamas.  Baby nail clippers.  Receiving blankets.  Mattress pad and sheets for the crib.  Night-light for the baby's  room.  Baby monitor.  2 or 3 pacifiers.  Either 24-36 cloth diapers and waterproof diaper covers or a box of disposable diapers. You may need to use as many as 10-12 diapers per day. HOW DO I PREPARE FOR AN EMERGENCY? Prepare for an emergency by:  Knowing how to get to the nearest hospital.  Listing the phone numbers of your baby's health care providers near your home phone and in your cell phone. HOW DO I PREPARE MY FAMILY?  Decide how to handle visitors.  If you have other children:  Talk with them about the baby coming home. Ask them how they feel about it.  Read a book together about being a new big brother or sister.  Find ways to let them help you prepare for the new baby.  Have someone ready to care for them while you are in the hospital.   This information is not intended to replace advice given to you by your health care provider. Make sure you discuss any questions you have with your health care provider.   Document Released: 07/22/2008 Document Revised: 12/24/2014 Document Reviewed: 07/17/2014 Elsevier Interactive Patient Education [employer]

## 2016-05-13 ENCOUNTER — Encounter (HOSPITAL_COMMUNITY): Payer: Self-pay | Admitting: *Deleted

## 2016-05-13 ENCOUNTER — Ambulatory Visit (INDEPENDENT_AMBULATORY_CARE_PROVIDER_SITE_OTHER): Payer: Medicaid Other | Admitting: Family Medicine

## 2016-05-13 ENCOUNTER — Inpatient Hospital Stay (HOSPITAL_COMMUNITY)
Admission: AD | Admit: 2016-05-13 | Discharge: 2016-05-17 | DRG: 765 | Disposition: A | Discharge status: Home / Self Care | Payer: Medicaid Other | Source: Ambulatory Visit | Attending: Obstetrics & Gynecology | Admitting: Obstetrics & Gynecology

## 2016-05-13 VITALS — BP 144/97 | HR 83 | Temp 98.0°F | Wt >= 6400 oz

## 2016-05-13 DIAGNOSIS — Z3A39 39 weeks gestation of pregnancy: Secondary | ICD-10-CM | POA: Diagnosis not present

## 2016-05-13 DIAGNOSIS — O289 Unspecified abnormal findings on antenatal screening of mother: Secondary | ICD-10-CM | POA: Diagnosis not present

## 2016-05-13 DIAGNOSIS — O133 Gestational [pregnancy-induced] hypertension without significant proteinuria, third trimester: Secondary | ICD-10-CM

## 2016-05-13 DIAGNOSIS — O09513 Supervision of elderly primigravida, third trimester: Secondary | ICD-10-CM

## 2016-05-13 DIAGNOSIS — Z98891 History of uterine scar from previous surgery: Secondary | ICD-10-CM

## 2016-05-13 DIAGNOSIS — Z3490 Encounter for supervision of normal pregnancy, unspecified, unspecified trimester: Secondary | ICD-10-CM

## 2016-05-13 DIAGNOSIS — Z9851 Tubal ligation status: Secondary | ICD-10-CM

## 2016-05-13 DIAGNOSIS — Z880 Allergy status to penicillin: Secondary | ICD-10-CM

## 2016-05-13 DIAGNOSIS — Z6841 Body Mass Index (BMI) 40.0 and over, adult: Secondary | ICD-10-CM | POA: Diagnosis not present

## 2016-05-13 DIAGNOSIS — E559 Vitamin D deficiency, unspecified: Secondary | ICD-10-CM | POA: Diagnosis present

## 2016-05-13 DIAGNOSIS — Z833 Family history of diabetes mellitus: Secondary | ICD-10-CM | POA: Diagnosis not present

## 2016-05-13 DIAGNOSIS — Z8249 Family history of ischemic heart disease and other diseases of the circulatory system: Secondary | ICD-10-CM | POA: Diagnosis not present

## 2016-05-13 DIAGNOSIS — O99214 Obesity complicating childbirth: Secondary | ICD-10-CM | POA: Diagnosis present

## 2016-05-13 DIAGNOSIS — R8271 Bacteriuria: Secondary | ICD-10-CM | POA: Diagnosis not present

## 2016-05-13 DIAGNOSIS — Z302 Encounter for sterilization: Secondary | ICD-10-CM

## 2016-05-13 DIAGNOSIS — Z87891 Personal history of nicotine dependence: Secondary | ICD-10-CM

## 2016-05-13 DIAGNOSIS — O99824 Streptococcus B carrier state complicating childbirth: Secondary | ICD-10-CM | POA: Diagnosis present

## 2016-05-13 DIAGNOSIS — O134 Gestational [pregnancy-induced] hypertension without significant proteinuria, complicating childbirth: Principal | ICD-10-CM | POA: Diagnosis present

## 2016-05-13 DIAGNOSIS — O280 Abnormal hematological finding on antenatal screening of mother: Secondary | ICD-10-CM

## 2016-05-13 LAB — COMPREHENSIVE METABOLIC PANEL
ALBUMIN: 2.9 g/dL — AB (ref 3.5–5.0)
ALT: 12 U/L — ABNORMAL LOW (ref 14–54)
AST: 14 U/L — AB (ref 15–41)
Alkaline Phosphatase: 128 U/L — ABNORMAL HIGH (ref 38–126)
Anion gap: 7 (ref 5–15)
BILIRUBIN TOTAL: 0.3 mg/dL (ref 0.3–1.2)
BUN: 10 mg/dL (ref 6–20)
CHLORIDE: 105 mmol/L (ref 101–111)
CO2: 25 mmol/L (ref 22–32)
CREATININE: 0.55 mg/dL (ref 0.44–1.00)
Calcium: 9.4 mg/dL (ref 8.9–10.3)
GFR calc Af Amer: >60 mL/min (ref >60)
GFR calc non Af Amer: >60 mL/min (ref >60)
Glucose, Bld: 105 mg/dL — ABNORMAL HIGH (ref 65–99)
Potassium: 3.9 mmol/L (ref 3.5–5.1)
SODIUM: 137 mmol/L (ref 135–145)
TOTAL PROTEIN: 6.7 g/dL (ref 6.5–8.1)

## 2016-05-13 LAB — TYPE AND SCREEN
ABO/RH(D): B POS
Antibody Screen: NEGATIVE

## 2016-05-13 LAB — CBC
HCT: 32.5 % — ABNORMAL LOW (ref 36.0–46.0)
HEMOGLOBIN: 11.6 g/dL — AB (ref 12.0–15.0)
MCH: 24.3 pg — AB (ref 26.0–34.0)
MCHC: 35.7 g/dL (ref 30.0–36.0)
MCV: 68.1 fL — AB (ref 78.0–100.0)
Platelets: 182 10*3/uL (ref 150–400)
RBC: 4.77 MIL/uL (ref 3.87–5.11)
RDW: 16.8 % — ABNORMAL HIGH (ref 11.5–15.5)
WBC: 8.5 10*3/uL (ref 4.0–10.5)

## 2016-05-13 LAB — PROTEIN / CREATININE RATIO, URINE
CREATININE, URINE: 161 mg/dL
PROTEIN CREATININE RATIO: 0.09 mg/mg{creat} (ref 0.00–0.15)
TOTAL PROTEIN, URINE: 14 mg/dL

## 2016-05-13 LAB — ABO/RH: ABO/RH(D): B POS

## 2016-05-13 MED ORDER — PHENYLEPHRINE 40 MCG/ML (10ML) SYRINGE FOR IV PUSH (FOR BLOOD PRESSURE SUPPORT): 80.0000 ug | PREFILLED_SYRINGE | INTRAVENOUS | Status: DC | PRN

## 2016-05-13 MED ORDER — LIDOCAINE HCL (PF) 1 % IJ SOLN: 30.0000 mL | INTRAMUSCULAR | Status: DC | PRN

## 2016-05-13 MED ORDER — EPHEDRINE 5 MG/ML INJ: 10.0000 mg | INTRAVENOUS | Status: DC | PRN

## 2016-05-13 MED ORDER — DIPHENHYDRAMINE HCL 50 MG/ML IJ SOLN: 12.5000 mg | INTRAMUSCULAR | Status: DC | PRN

## 2016-05-13 MED ORDER — OXYTOCIN BOLUS FROM INFUSION: 500.0000 mL | Freq: Once | INTRAVENOUS | Status: DC

## 2016-05-13 MED ORDER — OXYCODONE-ACETAMINOPHEN 5-325 MG PO TABS: 1.0000 | ORAL_TABLET | ORAL | Status: DC | PRN

## 2016-05-13 MED ORDER — TERBUTALINE SULFATE 1 MG/ML IJ SOLN
0.2500 mg | Freq: Once | INTRAMUSCULAR | Status: AC | PRN
  Administered 2016-05-14: 0.25 mg via SUBCUTANEOUS
  Filled 2016-05-13: qty 1

## 2016-05-13 MED ORDER — SOD CITRATE-CITRIC ACID 500-334 MG/5ML PO SOLN
30.0000 mL | ORAL | Status: DC | PRN
  Filled 2016-05-13: qty 15

## 2016-05-13 MED ORDER — CEFAZOLIN SODIUM-DEXTROSE 2-4 GM/100ML-% IV SOLN: 2.0000 g | Freq: Once | INTRAVENOUS | Status: DC

## 2016-05-13 MED ORDER — CEFAZOLIN IN D5W 1 GM/50ML IV SOLN
1.0000 g | Freq: Three times a day (TID) | INTRAVENOUS | Status: DC
  Filled 2016-05-13 (×4): qty 50

## 2016-05-13 MED ORDER — LACTATED RINGERS IV SOLN: 500.0000 mL | Freq: Once | INTRAVENOUS | Status: DC

## 2016-05-13 MED ORDER — ONDANSETRON HCL 4 MG/2ML IJ SOLN: 4.0000 mg | Freq: Four times a day (QID) | INTRAMUSCULAR | Status: DC | PRN

## 2016-05-13 MED ORDER — FENTANYL 2.5 MCG/ML BUPIVACAINE 1/10 % EPIDURAL INFUSION (WH - ANES): 14.0000 mL/h | INTRAMUSCULAR | Status: DC | PRN

## 2016-05-13 MED ORDER — CEFAZOLIN IN D5W 1 GM/50ML IV SOLN: 1.0000 g | Freq: Three times a day (TID) | INTRAVENOUS | Status: DC

## 2016-05-13 MED ORDER — LACTATED RINGERS IV SOLN
INTRAVENOUS | Status: DC
  Administered 2016-05-14 (×2): via INTRAVENOUS

## 2016-05-13 MED ORDER — MISOPROSTOL 25 MCG QUARTER TABLET
25.0000 ug | ORAL_TABLET | ORAL | Status: DC | PRN
  Administered 2016-05-13 – 2016-05-14 (×3): 25 ug via VAGINAL
  Filled 2016-05-13 (×2): qty 0.25

## 2016-05-13 MED ORDER — LACTATED RINGERS IV SOLN
500.0000 mL | INTRAVENOUS | Status: DC | PRN
  Administered 2016-05-14: 500 mL via INTRAVENOUS

## 2016-05-13 MED ORDER — ACETAMINOPHEN 325 MG PO TABS: 650.0000 mg | ORAL_TABLET | ORAL | Status: DC | PRN

## 2016-05-13 MED ORDER — OXYCODONE-ACETAMINOPHEN 5-325 MG PO TABS
2.0000 | ORAL_TABLET | ORAL | Status: DC | PRN
Start: 2016-05-13 — End: 2016-05-14

## 2016-05-13 MED ORDER — OXYTOCIN 40 UNITS IN LACTATED RINGERS INFUSION - SIMPLE MED: 2.5000 [IU]/h | INTRAVENOUS | Status: DC

## 2016-05-13 NOTE — H&P (Signed)
LABOR AND DELIVERY ADMISSION HISTORY AND PHYSICAL NOTE  Marissa Lowe is a 35 y.o. female G1P0000 with IUP at [redacted]w[redacted]d by 11/4wk U/S presenting for IOL for gHTN.   She reports positive fetal movement. She denies leakage of fluid or vaginal bleeding.  Prenatal History/Complications: AMA, morbid obesity, HgbAC plus alpha thalasemia trait   Past Medical History: Past Medical History:  Diagnosis Date  . Arthritis of foot, left    (see imaging 02/2014)  . Obesity   . Seasonal allergies   . Vitamin D deficiency     Past Surgical History: History reviewed. No pertinent surgical history.  Obstetrical History: OB History    Gravida Para Term Preterm AB Living   1 0 0 0 0 0   SAB TAB Ectopic Multiple Live Births   0 0 0 0        Social History: Social History   Social History  . Marital status: Single    Spouse name: N/A  . Number of children: N/A  . Years of education: N/A   Occupational History  . works in Pharmacologist room at [employer]   Social History Main Topics  . Smoking status: Former Smoker    Types: Cigarettes    Quit date: 09/01/2009  . Smokeless tobacco: Never Used  . Alcohol use No  . Drug use: No  . Sexual activity: Yes    Partners: Male    Birth control/ protection: None   Other Topics Concern  . None   Social History Narrative   Lives with her sister.  No tobacco exposure.   In monogamous relationship x 3 years    Family History: Family History  Problem Relation Age of Onset  . Hypertension Sister   . Hypertension Brother   . Diabetes Mother   . Heart failure Father   . Hypertension Father   . Glaucoma Brother   . Cancer Neg Hx   . Stroke Neg Hx     Allergies: Allergies  Allergen Reactions  . Penicillins Hives and Other (See Comments)    Has patient had a PCN reaction causing immediate rash, facial/tongue/throat swelling, SOB or lightheadedness with hypotension: No Has patient had a PCN reaction causing severe rash  involving mucus membranes or skin necrosis: No Has patient had a PCN reaction that required hospitalization No Has patient had a PCN reaction occurring within the last 10 years: No If all of the above answers are "NO", then may proceed with Cephalosporin use.    Prescriptions Prior to Admission  Medication Sig Dispense Refill Last Dose  . acetaminophen (TYLENOL) 500 MG tablet Take 500-1,000 mg by mouth every 6 (six) hours as needed for mild pain, moderate pain or headache.    Past Week at Unknown time  . cholecalciferol (VITAMIN D) 1000 UNITS tablet Take 1,000 Units by mouth at bedtime.    05/12/2016 at Unknown time  . Prenatal Vit-Fe Fumarate-FA (PRENATAL MULTIVITAMIN) TABS tablet Take 1 tablet by mouth at bedtime.   05/12/2016 at Unknown time     Review of Systems   All systems reviewed and negative except as stated in HPI  Last menstrual period 08/21/2015. General appearance: alert, cooperative and no distress Lungs: no respiratory distress Heart: regular rate Abdomen: soft, non-tender Extremities: No calf swelling or tenderness Presentation: cephalic by cervical exam Fetal monitoring: 135 baseline, moderate variability, +accelerations, no decelerations Uterine activity: none     Prenatal labs: ABO, Rh: B/POS/-- (03/03 1114) Antibody: NEG (03/03 1114) Rubella: immune RPR:  Non Reactive (06/30 1115)  HBsAg: NEGATIVE (03/03 1114)  HIV: Non Reactive (06/30 1115)  GBS: Positive (03/03 0000)   Prenatal Transfer Tool  Maternal Diabetes: No Genetic Screening: Normal Maternal Ultrasounds/Referrals: Normal Fetal Ultrasounds or other Referrals:  None Maternal Substance Abuse:  No Significant Maternal Medications:  None Significant Maternal Lab Results: Lab values include: Group B Strep positive  No results found for this or any previous visit (from the past 24 hour(s)).  Patient Active Problem List   Diagnosis Date Noted  . GBS bacteriuria 04/20/2016  . Supervision of  elderly primigravida in third trimester 04/06/2016  . Advanced maternal age, 1st pregnancy 03/15/2016  . Abnormal maternal serum screening test 03/15/2016  . Morbid obesity with BMI of 50.0-59.9, adult (HCC) 05/09/2014  . Allergic rhinitis, cause unspecified 03/02/2013    Assessment: Marissa Lowe is a 35 y.o. G1P0000 at 4257w3d here for IOL for gHTN  #Labor:cytotec. Check CBC, CMP, urine protein/creatinine ratio #Pain: Planning on epidural #FWB: Category I #ID:  GBS postive #MOF: breast and bottle #MOC:BTL, consent signed 04/06/16.  Long discussion regarding LARCS; may consider #Circ:  Outpatient   Marissa HerElsia J Yoo, DO PGY-1 9/21/20175:51 PM    I have seen this pt and agree with the above  CRESENZO-DISHMAN,Iria Jamerson

## 2016-05-13 NOTE — Anesthesia Pain Management Evaluation Note (Signed)
  CRNA Pain Management Visit Note  Patient: Marissa Lowe, 35 y.o., female  "Hello I am a [facility] of the anesthesia team at [facility]. We have an anesthesia team available at all times to provide care throughout the hospital, including epidural management and anesthesia for C-section. I don't know your plan for the delivery whether it a natural birth, water birth, IV sedation, nitrous supplementation, doula or epidural, but we want to meet your pain goals."   1.Was your pain managed to your expectations on prior hospitalizations?   No prior hospitalizations  2.What is your expectation for pain management during this hospitalization?     Epidural  3.How can we help you reach that goal? epidural  Record the patient's initial score and the patient's pain goal.   Pain: 0  Pain Goal: 5 [facility] wants you to be able to say your pain was always managed very well.  Kamaryn Grimley 05/13/2016

## 2016-05-13 NOTE — Progress Notes (Signed)
   PRENATAL VISIT NOTE  Subjective:  Marissa Lowe is a 35 y.o. G1P0000 at 7266w3d being seen today for ongoing prenatal care.  She is currently monitored for the following issues for this high-risk pregnancy and has RBC microcytosis; Allergic rhinitis, cause unspecified; Morbid obesity with BMI of 50.0-59.9, adult (HCC); Advanced maternal age, 1st pregnancy; Abnormal maternal serum screening test; Supervision of elderly primigravida in third trimester; and GBS bacteriuria on her problem list.  Patient reports no complaints.  Contractions: Not present. Vag. Bleeding: None.  Movement: Present. Denies leaking of fluid.   The following portions of the patient's history were reviewed and updated as appropriate: allergies, current medications, past family history, past medical history, past social history, past surgical history and problem list. Problem list updated.  Objective:   Vitals:   05/13/16 1417  BP: (!) 144/97  Pulse: 83  Temp: 98 F (36.7 C)  Weight: (!) 434 lb 8 oz (197.1 kg)    Fetal Status: Fetal Heart Rate (bpm): 138 Fundal Height: 43 cm Movement: Present  Presentation: Vertex  General:  Alert, oriented and cooperative. Patient is in no acute distress.  Skin: Skin is warm and dry. No rash noted.   Cardiovascular: Normal heart rate noted  Respiratory: Normal respiratory effort, no problems with respiration noted  Abdomen: Soft, gravid, appropriate for gestational age. Pain/Pressure: Present     Pelvic:  Cervical exam deferred Dilation: Closed Effacement (%): 50 Station: Ballotable  Extremities: Normal range of motion.  Edema: Trace  Mental Status: Normal mood and affect. Normal behavior. Normal judgment and thought content.   Urinalysis: Urine Protein: 1+ Urine Glucose: Negative  Assessment and Plan:  Pregnancy: G1P0000 at 7666w3d 1. Supervision of elderly primigravida in third trimester Continue prenatal care.   2. Abnormal maternal serum screening test High risk of  T21  3. Advanced maternal age, 1st pregnancy, third trimester   4. GBS bacteriuria Allergic to PCN and no sensitivities  5. Gestational hypertension without significant proteinuria, antepartum, third trimester Given GA and BP elevation and proteinuria   Term labor symptoms and general obstetric precautions including but not limited to vaginal bleeding, contractions, leaking of fluid and fetal movement were reviewed in detail with the patient. Please refer to After Visit Summary for other counseling recommendations.   Reva Boresanya S Zulay Corrie, MD

## 2016-05-14 ENCOUNTER — Encounter: Payer: Self-pay | Admitting: Obstetrics and Gynecology

## 2016-05-14 ENCOUNTER — Ambulatory Visit (HOSPITAL_COMMUNITY): Payer: Medicaid Other

## 2016-05-14 ENCOUNTER — Inpatient Hospital Stay (HOSPITAL_COMMUNITY): Payer: Medicaid Other | Admitting: Anesthesiology

## 2016-05-14 ENCOUNTER — Encounter (HOSPITAL_COMMUNITY)
Admission: AD | Disposition: A | Discharge status: Home / Self Care | Payer: Self-pay | Source: Ambulatory Visit | Attending: Obstetrics & Gynecology

## 2016-05-14 ENCOUNTER — Ambulatory Visit (HOSPITAL_COMMUNITY): Admission: RE | Admit: 2016-05-14 | Payer: Managed Care, Other (non HMO) | Source: Ambulatory Visit

## 2016-05-14 ENCOUNTER — Encounter (HOSPITAL_COMMUNITY): Payer: Self-pay | Admitting: Anesthesiology

## 2016-05-14 DIAGNOSIS — O99824 Streptococcus B carrier state complicating childbirth: Secondary | ICD-10-CM

## 2016-05-14 DIAGNOSIS — O99210 Obesity complicating pregnancy, unspecified trimester: Secondary | ICD-10-CM | POA: Insufficient documentation

## 2016-05-14 DIAGNOSIS — E559 Vitamin D deficiency, unspecified: Secondary | ICD-10-CM

## 2016-05-14 DIAGNOSIS — Z3A39 39 weeks gestation of pregnancy: Secondary | ICD-10-CM

## 2016-05-14 DIAGNOSIS — Z87891 Personal history of nicotine dependence: Secondary | ICD-10-CM

## 2016-05-14 DIAGNOSIS — Z302 Encounter for sterilization: Secondary | ICD-10-CM

## 2016-05-14 DIAGNOSIS — Z98891 History of uterine scar from previous surgery: Secondary | ICD-10-CM

## 2016-05-14 DIAGNOSIS — O134 Gestational [pregnancy-induced] hypertension without significant proteinuria, complicating childbirth: Secondary | ICD-10-CM

## 2016-05-14 DIAGNOSIS — Z880 Allergy status to penicillin: Secondary | ICD-10-CM

## 2016-05-14 DIAGNOSIS — O99214 Obesity complicating childbirth: Secondary | ICD-10-CM

## 2016-05-14 HISTORY — DX: Thalassemia minor: D56.3

## 2016-05-14 HISTORY — DX: Body Mass Index (BMI) 40.0 and over, adult: Z684

## 2016-05-14 HISTORY — DX: Morbid (severe) obesity due to excess calories: E66.01

## 2016-05-14 LAB — CBC
HEMATOCRIT: 32 % — AB (ref 36.0–46.0)
Hemoglobin: 11.4 g/dL — ABNORMAL LOW (ref 12.0–15.0)
MCH: 24.2 pg — ABNORMAL LOW (ref 26.0–34.0)
MCHC: 35.6 g/dL (ref 30.0–36.0)
MCV: 67.9 fL — AB (ref 78.0–100.0)
Platelets: 175 10*3/uL (ref 150–400)
RBC: 4.71 MIL/uL (ref 3.87–5.11)
RDW: 16.6 % — ABNORMAL HIGH (ref 11.5–15.5)
WBC: 7.3 10*3/uL (ref 4.0–10.5)

## 2016-05-14 SURGERY — Surgical Case
Anesthesia: Epidural

## 2016-05-14 MED ORDER — SODIUM CHLORIDE 0.9 % IR SOLN
Status: DC | PRN
  Administered 2016-05-14: 1000 mL

## 2016-05-14 MED ORDER — NALBUPHINE HCL 10 MG/ML IJ SOLN: 5.0000 mg | Freq: Once | INTRAMUSCULAR | Status: DC | PRN

## 2016-05-14 MED ORDER — ONDANSETRON HCL 4 MG/2ML IJ SOLN
INTRAMUSCULAR | Status: AC
  Filled 2016-05-14: qty 2

## 2016-05-14 MED ORDER — CLINDAMYCIN PHOSPHATE 900 MG/50ML IV SOLN: 900.0000 mg | INTRAVENOUS | Status: DC

## 2016-05-14 MED ORDER — PHENYLEPHRINE 8 MG IN D5W 100 ML (0.08MG/ML) PREMIX OPTIME
INJECTION | INTRAVENOUS | Status: AC
  Filled 2016-05-14: qty 300

## 2016-05-14 MED ORDER — KETOROLAC TROMETHAMINE 30 MG/ML IJ SOLN
INTRAMUSCULAR | Status: AC
  Filled 2016-05-14: qty 1

## 2016-05-14 MED ORDER — SENNOSIDES-DOCUSATE SODIUM 8.6-50 MG PO TABS
2.0000 | ORAL_TABLET | ORAL | Status: DC
  Administered 2016-05-15 – 2016-05-16 (×3): 2 via ORAL
  Filled 2016-05-14 (×3): qty 2

## 2016-05-14 MED ORDER — MEPERIDINE HCL 25 MG/ML IJ SOLN: 6.2500 mg | INTRAMUSCULAR | Status: DC | PRN

## 2016-05-14 MED ORDER — ONDANSETRON HCL 4 MG/2ML IJ SOLN: 4.0000 mg | Freq: Three times a day (TID) | INTRAMUSCULAR | Status: DC | PRN

## 2016-05-14 MED ORDER — NALOXONE HCL 0.4 MG/ML IJ SOLN: 0.4000 mg | INTRAMUSCULAR | Status: DC | PRN

## 2016-05-14 MED ORDER — SODIUM CHLORIDE 0.9 % IJ SOLN
INTRAMUSCULAR | Status: AC
  Filled 2016-05-14: qty 10

## 2016-05-14 MED ORDER — OXYTOCIN 10 UNIT/ML IJ SOLN
INTRAVENOUS | Status: DC | PRN
  Administered 2016-05-14: 40 [IU] via INTRAVENOUS

## 2016-05-14 MED ORDER — KETOROLAC TROMETHAMINE 30 MG/ML IJ SOLN
30.0000 mg | Freq: Four times a day (QID) | INTRAMUSCULAR | Status: AC | PRN
  Administered 2016-05-14: 30 mg via INTRAMUSCULAR

## 2016-05-14 MED ORDER — WITCH HAZEL-GLYCERIN EX PADS: 1.0000 "application " | MEDICATED_PAD | CUTANEOUS | Status: DC | PRN

## 2016-05-14 MED ORDER — COCONUT OIL OIL
1.0000 "application " | TOPICAL_OIL | Status: DC | PRN
  Administered 2016-05-15: 1 via TOPICAL
  Filled 2016-05-14: qty 120

## 2016-05-14 MED ORDER — MORPHINE SULFATE (PF) 0.5 MG/ML IJ SOLN
INTRAMUSCULAR | Status: DC | PRN
  Administered 2016-05-14: .2 mg via EPIDURAL

## 2016-05-14 MED ORDER — SIMETHICONE 80 MG PO CHEW
80.0000 mg | CHEWABLE_TABLET | ORAL | Status: DC
  Administered 2016-05-15 – 2016-05-16 (×3): 80 mg via ORAL
  Filled 2016-05-14 (×3): qty 1

## 2016-05-14 MED ORDER — SCOPOLAMINE 1 MG/3DAYS TD PT72
MEDICATED_PATCH | TRANSDERMAL | Status: DC | PRN
  Administered 2016-05-14: 1 via TRANSDERMAL

## 2016-05-14 MED ORDER — ZOLPIDEM TARTRATE 5 MG PO TABS
5.0000 mg | ORAL_TABLET | Freq: Every evening | ORAL | Status: DC | PRN
Start: 2016-05-14 — End: 2016-05-17

## 2016-05-14 MED ORDER — ACETAMINOPHEN 500 MG PO TABS
1000.0000 mg | ORAL_TABLET | Freq: Four times a day (QID) | ORAL | Status: AC
  Administered 2016-05-15 (×4): 1000 mg via ORAL
  Filled 2016-05-14 (×4): qty 2

## 2016-05-14 MED ORDER — DIBUCAINE 1 % RE OINT: 1.0000 "application " | TOPICAL_OINTMENT | RECTAL | Status: DC | PRN

## 2016-05-14 MED ORDER — LACTATED RINGERS IV SOLN
INTRAVENOUS | Status: DC | PRN
  Administered 2016-05-14: 20:00:00 via INTRAVENOUS

## 2016-05-14 MED ORDER — FENTANYL CITRATE (PF) 100 MCG/2ML IJ SOLN
INTRAMUSCULAR | Status: AC
  Filled 2016-05-14: qty 2

## 2016-05-14 MED ORDER — FENTANYL CITRATE (PF) 100 MCG/2ML IJ SOLN: 25.0000 ug | INTRAMUSCULAR | Status: DC | PRN

## 2016-05-14 MED ORDER — NALBUPHINE HCL 10 MG/ML IJ SOLN: 5.0000 mg | INTRAMUSCULAR | Status: DC | PRN

## 2016-05-14 MED ORDER — ONDANSETRON HCL 4 MG/2ML IJ SOLN
INTRAMUSCULAR | Status: DC | PRN
  Administered 2016-05-14: 4 mg via INTRAVENOUS

## 2016-05-14 MED ORDER — SIMETHICONE 80 MG PO CHEW: 80.0000 mg | CHEWABLE_TABLET | ORAL | Status: DC | PRN

## 2016-05-14 MED ORDER — OXYTOCIN 10 UNIT/ML IJ SOLN
INTRAMUSCULAR | Status: AC
  Filled 2016-05-14: qty 4

## 2016-05-14 MED ORDER — LACTATED RINGERS IV SOLN
INTRAVENOUS | Status: DC
  Administered 2016-05-15: 06:00:00 via INTRAVENOUS

## 2016-05-14 MED ORDER — TETANUS-DIPHTH-ACELL PERTUSSIS 5-2.5-18.5 LF-MCG/0.5 IM SUSP
0.5000 mL | Freq: Once | INTRAMUSCULAR | Status: AC
  Administered 2016-05-15: 0.5 mL via INTRAMUSCULAR
  Filled 2016-05-14: qty 0.5

## 2016-05-14 MED ORDER — SCOPOLAMINE 1 MG/3DAYS TD PT72
MEDICATED_PATCH | TRANSDERMAL | Status: AC
  Filled 2016-05-14: qty 1

## 2016-05-14 MED ORDER — ONDANSETRON HCL 4 MG/2ML IJ SOLN: 4.0000 mg | Freq: Once | INTRAMUSCULAR | Status: DC | PRN

## 2016-05-14 MED ORDER — SCOPOLAMINE 1 MG/3DAYS TD PT72
1.0000 | MEDICATED_PATCH | Freq: Once | TRANSDERMAL | Status: DC
  Filled 2016-05-14: qty 1

## 2016-05-14 MED ORDER — OXYTOCIN 40 UNITS IN LACTATED RINGERS INFUSION - SIMPLE MED: 2.5000 [IU]/h | INTRAVENOUS | Status: AC

## 2016-05-14 MED ORDER — SOD CITRATE-CITRIC ACID 500-334 MG/5ML PO SOLN
30.0000 mL | ORAL | Status: AC
  Administered 2016-05-14: 30 mL via ORAL

## 2016-05-14 MED ORDER — GENTAMICIN SULFATE 40 MG/ML IJ SOLN
Freq: Once | INTRAMUSCULAR | Status: AC
  Administered 2016-05-14: 100 mL via INTRAVENOUS
  Filled 2016-05-14: qty 13.75

## 2016-05-14 MED ORDER — GENTIAN VIOLET 1 % EX SOLN
0.5000 mL | Freq: Once | CUTANEOUS | Status: DC
  Filled 2016-05-14: qty 59

## 2016-05-14 MED ORDER — GENTAMICIN SULFATE 40 MG/ML IJ SOLN: 5.0000 mg/kg | INTRAVENOUS | Status: DC

## 2016-05-14 MED ORDER — BUPIVACAINE IN DEXTROSE 0.75-8.25 % IT SOLN
INTRATHECAL | Status: DC | PRN
  Administered 2016-05-14: 1.6 mL via INTRATHECAL

## 2016-05-14 MED ORDER — MENTHOL 3 MG MT LOZG: 1.0000 | LOZENGE | OROMUCOSAL | Status: DC | PRN

## 2016-05-14 MED ORDER — BUPIVACAINE IN DEXTROSE 0.75-8.25 % IT SOLN
INTRATHECAL | Status: AC
  Filled 2016-05-14: qty 2

## 2016-05-14 MED ORDER — NALOXONE HCL 2 MG/2ML IJ SOSY
1.0000 ug/kg/h | PREFILLED_SYRINGE | INTRAVENOUS | Status: DC | PRN
  Filled 2016-05-14: qty 2

## 2016-05-14 MED ORDER — DIPHENHYDRAMINE HCL 25 MG PO CAPS: 25.0000 mg | ORAL_CAPSULE | Freq: Four times a day (QID) | ORAL | Status: DC | PRN

## 2016-05-14 MED ORDER — DIPHENHYDRAMINE HCL 25 MG PO CAPS: 25.0000 mg | ORAL_CAPSULE | ORAL | Status: DC | PRN

## 2016-05-14 MED ORDER — PRENATAL MULTIVITAMIN CH
1.0000 | ORAL_TABLET | Freq: Every day | ORAL | Status: DC
  Administered 2016-05-15 – 2016-05-17 (×3): 1 via ORAL
  Filled 2016-05-14 (×3): qty 1

## 2016-05-14 MED ORDER — IBUPROFEN 600 MG PO TABS
600.0000 mg | ORAL_TABLET | Freq: Four times a day (QID) | ORAL | Status: DC
  Administered 2016-05-15 – 2016-05-17 (×10): 600 mg via ORAL
  Filled 2016-05-14 (×10): qty 1

## 2016-05-14 MED ORDER — ACETAMINOPHEN 325 MG PO TABS
650.0000 mg | ORAL_TABLET | ORAL | Status: DC | PRN
Start: 2016-05-14 — End: 2016-05-17
  Administered 2016-05-16 – 2016-05-17 (×3): 650 mg via ORAL
  Filled 2016-05-14 (×3): qty 2

## 2016-05-14 MED ORDER — SIMETHICONE 80 MG PO CHEW
80.0000 mg | CHEWABLE_TABLET | Freq: Three times a day (TID) | ORAL | Status: DC
  Administered 2016-05-15 – 2016-05-17 (×7): 80 mg via ORAL
  Filled 2016-05-14 (×7): qty 1

## 2016-05-14 MED ORDER — SODIUM CHLORIDE 0.9% FLUSH: 3.0000 mL | INTRAVENOUS | Status: DC | PRN

## 2016-05-14 MED ORDER — KETOROLAC TROMETHAMINE 30 MG/ML IJ SOLN: 30.0000 mg | Freq: Four times a day (QID) | INTRAMUSCULAR | Status: AC | PRN

## 2016-05-14 MED ORDER — DIPHENHYDRAMINE HCL 50 MG/ML IJ SOLN: 12.5000 mg | INTRAMUSCULAR | Status: DC | PRN

## 2016-05-14 MED ORDER — PHENYLEPHRINE 8 MG IN D5W 100 ML (0.08MG/ML) PREMIX OPTIME
INJECTION | INTRAVENOUS | Status: DC | PRN
  Administered 2016-05-14: 30 ug/min via INTRAVENOUS

## 2016-05-14 MED ORDER — FENTANYL CITRATE (PF) 100 MCG/2ML IJ SOLN
INTRAMUSCULAR | Status: DC | PRN
  Administered 2016-05-14: 10 ug via INTRAVENOUS

## 2016-05-14 MED ORDER — MORPHINE SULFATE-NACL 0.5-0.9 MG/ML-% IV SOSY
PREFILLED_SYRINGE | INTRAVENOUS | Status: AC
  Filled 2016-05-14: qty 1

## 2016-05-14 SURGICAL SUPPLY — 32 items
CANISTER SUCT 3000ML PPV (MISCELLANEOUS) ×3 IMPLANT
CHLORAPREP W/TINT 26ML (MISCELLANEOUS) ×3 IMPLANT
CLIP FILSHIE TUBAL LIGA STRL (Clip) ×2 IMPLANT
DRESSING DISP NPWT PICO 4X12 (MISCELLANEOUS) ×2 IMPLANT
DRSG OPSITE POSTOP 4X10 (GAUZE/BANDAGES/DRESSINGS) ×3 IMPLANT
ELECT REM PT RETURN 9FT ADLT (ELECTROSURGICAL) ×3
ELECTRODE REM PT RTRN 9FT ADLT (ELECTROSURGICAL) ×1 IMPLANT
GLOVE BIOGEL PI IND STRL 7.0 (GLOVE) ×2 IMPLANT
GLOVE BIOGEL PI IND STRL 7.5 (GLOVE) ×1 IMPLANT
GLOVE BIOGEL PI INDICATOR 7.0 (GLOVE) ×4
GLOVE BIOGEL PI INDICATOR 7.5 (GLOVE) ×2
GLOVE SKINSENSE NS SZ7.0 (GLOVE) ×2
GLOVE SKINSENSE STRL SZ7.0 (GLOVE) ×1 IMPLANT
GOWN STRL REUS W/ TWL LRG LVL3 (GOWN DISPOSABLE) ×2 IMPLANT
GOWN STRL REUS W/ TWL XL LVL3 (GOWN DISPOSABLE) ×1 IMPLANT
GOWN STRL REUS W/TWL LRG LVL3 (GOWN DISPOSABLE) ×6
GOWN STRL REUS W/TWL XL LVL3 (GOWN DISPOSABLE) ×3
LIQUID BAND (GAUZE/BANDAGES/DRESSINGS) ×3 IMPLANT
NS IRRIG 1000ML POUR BTL (IV SOLUTION) ×3 IMPLANT
PACK C SECTION WH (CUSTOM PROCEDURE TRAY) ×3 IMPLANT
PAD OB MATERNITY 4.3X12.25 (PERSONAL CARE ITEMS) ×3 IMPLANT
PAD PREP 24X48 CUFFED NSTRL (MISCELLANEOUS) ×3 IMPLANT
RETRACTOR TRAXI PANNICULUS (MISCELLANEOUS) IMPLANT
STAPLER VISISTAT 35W (STAPLE) ×2 IMPLANT
SUT MON AB 4-0 PS1 27 (SUTURE) ×3 IMPLANT
SUT MON AB-0 CT1 36 (SUTURE) ×6 IMPLANT
SUT PLAIN 2 0 (SUTURE) ×3
SUT PLAIN ABS 2-0 CT1 27XMFL (SUTURE) ×1 IMPLANT
SUT VIC AB 0 CT1 36 (SUTURE) ×6 IMPLANT
SUT VIC AB 3-0 CT1 27 (SUTURE) ×3
SUT VIC AB 3-0 CT1 TAPERPNT 27 (SUTURE) ×1 IMPLANT
TRAXI PANNICULUS RETRACTOR (MISCELLANEOUS) ×2

## 2016-05-14 NOTE — Progress Notes (Addendum)
L&D Note  05/14/2016 - 3:55 PM  35 y.o. G1 5538w4d. Pregnancy complicated by BMI 70s, alpha thal trait, GBS bacteruria, AMA  [name] is admitted for IOL for gHTN   Subjective:  Patient with O2 on for fetal decelerations  Objective:   Vitals:   05/14/16 1300 05/14/16 1400 05/14/16 1422 05/14/16 1536  BP:   133/69 133/81  Pulse:   96 83  Resp: 18 18    Temp:      TempSrc:      Weight:      Height:        Current Vital Signs 24h Vital Sign Ranges  T 97.6 F (36.4 C) Temp  Avg: 98.3 F (36.8 C)  Min: 97.6 F (36.4 C)  Max: 98.7 F (37.1 C)  BP 133/81 BP  Min: 119/74  Max: 146/88  HR 83 Pulse  Avg: 86.1  Min: 79  Max: 96  RR 18 Resp  Avg: 17.3  Min: 16  Max: 20  SaO2     No Data Recorded       24 Hour I/O Current Shift I/O  Time Ins Outs No intake/output data recorded. No intake/output data recorded.   FHR: 145 baseline, +accels, no decel in past 80727m, mod var  Prior to this, patient was having persistent late decels and got deeper with q3-7127m Toco: q3-8827m Gen: NAD SVE: FT  Labs:   Recent Labs Lab 05/13/16 1746  WBC 8.5  HGB 11.6*  HCT 32.5*  PLT 182    Recent Labs Lab 05/13/16 1746  NA 137  K 3.9  CL 105  CO2 25  BUN 10  CREATININE 0.55  CALCIUM 9.4  PROT 6.7  BILITOT 0.3  ALKPHOS 128*  ALT 12*  AST 14*  GLUCOSE 105*    Medications Current Facility-Administered Medications  Medication Dose Route Frequency Provider Last Rate Last Dose  . acetaminophen (TYLENOL) tablet 650 mg  650 mg Oral Q4H PRN [facility], DO      . diphenhydrAMINE (BENADRYL) injection 12.5 mg  12.5 mg Intravenous Q15 min PRN Lewie LoronJohn Germeroth, MD      . ePHEDrine injection 10 mg  10 mg Intravenous PRN Lewie LoronJohn Germeroth, MD      . ePHEDrine injection 10 mg  10 mg Intravenous PRN Lewie LoronJohn Germeroth, MD      . fentaNYL 2.5 mcg/ml w/bupivacaine 0.1% in NS 100ml epidural infusion (WH-ANES)  14 mL/hr Epidural Continuous PRN Lewie LoronJohn Germeroth, MD      . gentamicin (GARAMYCIN)  550 mg, clindamycin (CLEOCIN) 900 mg in dextrose 5 % 100 mL IVPB   Intravenous Once Adam PhenixJames G Arnold, MD      . lactated ringers infusion 500 mL  500 mL Intravenous Once Lewie LoronJohn Germeroth, MD      . lactated ringers infusion 500-1,000 mL  500-1,000 mL Intravenous PRN [facility], DO 1,000 mL/hr at 05/14/16 0251 500 mL at 05/14/16 0251  . lactated ringers infusion   Intravenous Continuous [facility] BlytheMumaw, [place] 125 mL/hr at 05/14/16 [id number]    . lidocaine (PF) (XYLOCAINE) 1 % injection 30 mL  30 mL Subcutaneous PRN [facility], DO      . misoprostol (CYTOTEC) tablet 25 mcg  25 mcg Vaginal Q4H PRN [facility] Mumaw, DO   25 mcg at 05/14/16 1234  . ondansetron (ZOFRAN) injection 4 mg  4 mg Intravenous Q6H PRN [facility], DO      . oxyCODONE-acetaminophen (PERCOCET/ROXICET) 5-325 MG per tablet 1  tablet  1 tablet Oral Q4H PRN [facility], DO      . oxyCODONE-acetaminophen (PERCOCET/ROXICET) 5-325 MG per tablet 2 tablet  2 tablet Oral Q4H PRN [facility], DO      . oxytocin (PITOCIN) IV BOLUS FROM BAG  500 mL Intravenous Once [facility], DO      . oxytocin (PITOCIN) IV infusion 40 units in LR 1000 mL - Premix  2.5 Units/hr Intravenous Continuous [facility], DO      . PHENYLephrine 40 mcg/ml in normal saline Adult IV Push Syringe  80 mcg Intravenous PRN Lewie Loron, MD      . PHENYLephrine 40 mcg/ml in normal saline Adult IV Push Syringe  80 mcg Intravenous PRN Lewie Loron, MD      . sodium citrate-citric acid (ORACIT) solution 30 mL  30 mL Oral Q2H PRN [facility], DO      . sodium citrate-citric acid (ORACIT) solution 30 mL  30 mL Oral 30 min Pre-Op [place], MD        Assessment & Plan:  Pt stable *IUP: currently improved *IOL: EFM improved with repositioning but will still give dose of terbutaline to allow fetus to recover. Pt contracting on her own with miso #3 given at  1230pm today. D/w pt that concerning that fetus isn't tolerating labor and she had episodes last night that was similar but not this persistent. Given this, I told her that I believe that she would be best served with primary c-section, even despite the risks associated with it due to her weight; she is amenable to proceeding. Last PO was salad and dressing, fried foods at noon. Since fetus is improved currently, I would like to wait until 8hrs s/p last PO but if fetus continues to not look well, then would prefer to proceed earlier. Anesthesia states that they'd be fine with proceeding before the 8hr mark but given her co-morbidities and degree of PO intake, I'd feel more comfortable waiting until then, if the fetus continues to look improved.  *GBS bacteruria: got macrobid during AP period.  *c-section: see above. PCN allergy is hives but given her weight, I'd recommend gent/clinda to minimize risk for breathing issues and would do azithro 500mg  IV pre op since she's in labor. I also told her I'd recommend ppx lovenox after surgery *Analgesia: no needs currently.   Cornelia Copa MD Attending Center for [facility])

## 2016-05-14 NOTE — Progress Notes (Signed)
LABOR PROGRESS NOTE  Marissa Lowe is a 35 y.o. G1P0000 at 238w4d  admitted for IOL for gHTN  Subjective: Patient doing well. Denies any concerns.  Objective: BP 135/78   Pulse 83   Temp 98.3 F (36.8 C) (Oral)   Resp 18   Ht 5\' 3"  (1.6 m)   Wt (!) 434 lb (196.9 kg)   LMP 08/21/2015   BMI 76.88 kg/m  or  Vitals:   05/13/16 2044 05/13/16 2200 05/13/16 2301 05/13/16 2302  BP: (!) 142/83   135/78  Pulse: 87   83  Resp: 16 16 18    Temp:  98.1 F (36.7 C) 98.3 F (36.8 C)   TempSrc:  Oral Oral   Weight:      Height:        NAD Dilation: Closed Effacement (%): Thick Cervical Position: Posterior Station: Ballotable Presentation: Undeterminable Exam by:: L.Stubbs, RN FHT: baseline rate 125, moderate varibility, +acel, no decel  Labs: Lab Results  Component Value Date   WBC 8.5 05/13/2016   HGB 11.6 (L) 05/13/2016   HCT 32.5 (L) 05/13/2016   MCV 68.1 (L) 05/13/2016   PLT 182 05/13/2016    Patient Active Problem List   Diagnosis Date Noted  . GBS bacteriuria 04/20/2016  . Supervision of elderly primigravida in third trimester 04/06/2016  . Advanced maternal age, 1st pregnancy 03/15/2016  . Abnormal maternal serum screening test 03/15/2016  . Morbid obesity with BMI of 50.0-59.9, adult (HCC) 05/09/2014  . Allergic rhinitis, cause unspecified 03/02/2013    Assessment / Plan: 35 y.o. G1P0000 at 6538w4d here for IOL for GHTN. HELLP labs negative. Urine P:C 0.09. BPs have been normal to mild range.  Labor: cervical ripening with cytotec Fetal Wellbeing:  Category I Pain Control:  Planning on epidural Anticipated MOD:  SVD  Marissa PearJulie P Brittin Janik, MD 05/14/2016, 12:37 AM

## 2016-05-14 NOTE — Progress Notes (Signed)
LABOR PROGRESS NOTE  Marissa Lowe is a 35 y.o. G1P0000 at 1843w4d  admitted for IOL for gHTN  Subjective: Patient sleeping.  Objective: BP (!) 143/83   Pulse 91   Temp 98.5 F (36.9 C) (Oral)   Resp 16   Ht 5\' 3"  (1.6 m)   Wt (!) 434 lb (196.9 kg)   LMP 08/21/2015   BMI 76.88 kg/m  or  Vitals:   05/13/16 2302 05/14/16 0126 05/14/16 0201 05/14/16 0302  BP: 135/78 140/79 (!) 146/88 (!) 143/83  Pulse: 83 85 85 91  Resp:  16 18 16   Temp:  98.2 F (36.8 C)  98.5 F (36.9 C)  TempSrc:  Oral  Oral  Weight:      Height:        NAD Dilation: Closed Effacement (%): Thick Cervical Position: Posterior Station: Ballotable Presentation: Undeterminable Exam by:: L.Stubbs, RN FHT: baseline rate 145, moderate varibility, no acel, occasional variable Toco: ctx q4-396min  Labs: Lab Results  Component Value Date   WBC 8.5 05/13/2016   HGB 11.6 (L) 05/13/2016   HCT 32.5 (L) 05/13/2016   MCV 68.1 (L) 05/13/2016   PLT 182 05/13/2016    Patient Active Problem List   Diagnosis Date Noted  . GBS bacteriuria 04/20/2016  . Supervision of elderly primigravida in third trimester 04/06/2016  . Advanced maternal age, 1st pregnancy 03/15/2016  . Abnormal maternal serum screening test 03/15/2016  . Morbid obesity with BMI of 50.0-59.9, adult (HCC) 05/09/2014  . Allergic rhinitis, cause unspecified 03/02/2013    Assessment / Plan: 35 y.o. G1P0000 at 6543w4d here for IOL for GHTN. HELLP labs negative. Urine P:C 0.09. BPs have been normal to mild range.  Labor: cervical ripening - s/p cytotec. Having early and variable decels earlier, s/p IV fluid bolus and repositioning. Strip  Improved, will continue to monitor for now. Plan to place cytotec again if strip reassuring. Fetal Wellbeing:  Category II Pain Control:  Planning on epidural Anticipated MOD:  SVD  Frederik PearJulie P Dorris Vangorder, MD 05/14/2016, 3:56 AM

## 2016-05-14 NOTE — Progress Notes (Signed)
OB Note  Bedside u/s shows fetus cephalic.  Cornelia Copaharlie Allyn Bertoni, Jr MD Attending Center for [facility](Faculty Practice)

## 2016-05-14 NOTE — Transfer of Care (Signed)
Immediate Anesthesia Transfer of Care Note  Patient: Marissa Lowe  Procedure(s) Performed: Procedure(s): CESAREAN SECTION (N/A)  Patient Location: PACU  Anesthesia Type:Spinal  Level of Consciousness: awake, alert  and oriented  Airway & Oxygen Therapy: Patient Spontanous Breathing  Post-op Assessment: Report given to RN and Post -op Vital signs reviewed and stable  Post vital signs: Reviewed and stable  Last Vitals:  Vitals:   05/14/16 1859 05/14/16 1908  BP:  132/72  Pulse:  85  Resp: 18 18  Temp:  37.2 C    Last Pain:  Vitals:   05/14/16 1908  TempSrc: Oral  PainSc:          Complications: No apparent anesthesia complications

## 2016-05-14 NOTE — Progress Notes (Signed)
Patient ID: Marissa Lowe, female   DOB: 04-30-1981, 35 y.o.   MRN: [id number] LABOR PROGRESS NOTE  Marissa Lowe is a 35 y.o. G1P0000 at 4532w4d  admitted for IOL for gHTN  Subjective: Doing well  Objective: BP 124/63   Pulse 82   Temp 97.6 F (36.4 C) (Oral)   Resp 20   Ht 5\' 3"  (1.6 m)   Wt (!) 196.9 kg (434 lb)   LMP 08/21/2015   BMI 76.88 kg/m  or  Vitals:   05/14/16 1000 05/14/16 1102 05/14/16 1236 05/14/16 1237  BP:    124/63  Pulse:    82  Resp: 18 18 20    Temp:   97.6 F (36.4 C)   TempSrc:   Oral   Weight:      Height:         Dilation: 1 Effacement (%): Thick Cervical Position: Anterior Station: Ballotable Presentation: Vertex Exam by:: Dr. Artist PaisYoo FHT: 135 baseline, moderate variability, + accelerations, no decelerations Uterine activity: q2-3 min  Labs: Lab Results  Component Value Date   WBC 8.5 05/13/2016   HGB 11.6 (L) 05/13/2016   HCT 32.5 (L) 05/13/2016   MCV 68.1 (L) 05/13/2016   PLT 182 05/13/2016    Patient Active Problem List   Diagnosis Date Noted  . Obesity in pregnancy 05/14/2016  . GBS bacteriuria 04/20/2016  . Supervision of elderly primigravida in third trimester 04/06/2016  . Abnormal maternal serum screening test 03/15/2016  . BMI 70 and over, adult (HCC) 05/09/2014  . Allergic rhinitis, cause unspecified 03/02/2013    Assessment / Plan: 35 y.o. G1P0000 at 8232w4d here for IOL for gHTN  Labor: cytotec #3 placed. Planning for foley bulb Fetal Wellbeing:  Category I Pain Control:  Planning for epidural Anticipated MOD:  SVD  Leland HerElsia J Fidelis Loth, DO PGY-1 9/22/201712:40 PM

## 2016-05-14 NOTE — Anesthesia Procedure Notes (Signed)
Epidural Patient location during procedure: OB  Staffing Anesthesiologist: Karie SchwalbeJUDD, Britt Theard Performed: anesthesiologist   Preanesthetic Checklist Completed: patient identified, site marked, surgical consent, pre-op evaluation, timeout performed, IV checked, risks and benefits discussed and monitors and equipment checked  Epidural Patient position: sitting Prep: site prepped and draped and DuraPrep Patient monitoring: continuous pulse ox and blood pressure Approach: midline Location: L3-L4 Injection technique: LOR saline  Needle:  Needle type: Tuohy  Needle gauge: 17 G Needle length: 9 cm and 9 Needle insertion depth: 9 cm Catheter type: closed end flexible Catheter size: 19 Gauge Catheter at skin depth: 15 cm Test dose: negative  Assessment Events: blood not aspirated, injection not painful, no injection resistance, negative IV test and no paresthesia  Additional Notes Patient identified. Risks/Benefits/Options discussed with patient including but not limited to bleeding, infection, nerve damage, paralysis, failed block, incomplete pain control, headache, blood pressure changes, nausea, vomiting, reactions to medication both or allergic, itching and postpartum back pain. Confirmed with bedside nurse the patient's most recent platelet count. Confirmed with patient that they are not currently taking any anticoagulation, have any bleeding history or any family history of bleeding disorders. Patient expressed understanding and wished to proceed. All questions were answered. Sterile technique was used throughout the entire procedure. Please see nursing notes for vital signs. Test dose was given through epidural catheter and negative prior to continuing to dose epidural or start infusion. Warning signs of high block given to the patient including shortness of breath, tingling/numbness in hands, complete motor block, or any concerning symptoms with instructions to call for help. Patient was given  instructions on fall risk and not to get out of bed. All questions and concerns addressed with instructions to call with any issues or inadequate analgesia.     This was a CSE. Spinal needle inserted after LOR and clear CSE return. Epidural catheter then thread after spinal needle withdrawn.

## 2016-05-14 NOTE — Anesthesia Postprocedure Evaluation (Signed)
Anesthesia Post Note  Patient: Marissa Lowe  Procedure(s) Performed: Procedure(s) (LRB): CESAREAN SECTION (N/A)  Patient location during evaluation: PACU Anesthesia Type: Spinal Level of consciousness: oriented and awake and alert Pain management: pain level controlled Vital Signs Assessment: post-procedure vital signs reviewed and stable Respiratory status: spontaneous breathing, respiratory function stable and patient connected to nasal cannula oxygen Cardiovascular status: blood pressure returned to baseline and stable Postop Assessment: no headache, no backache, spinal receding and patient able to bend at knees Anesthetic complications: no     Last Vitals:  Vitals:   05/14/16 2215 05/14/16 2305  BP: 130/78 128/72  Pulse: 74 72  Resp: (!) 29 (!) 22  Temp:  36.6 C    Last Pain:  Vitals:   05/14/16 2305  TempSrc: Oral  PainSc:    Pain Goal:                 Marissa Lowe JENNETTE

## 2016-05-14 NOTE — Progress Notes (Signed)
error 

## 2016-05-14 NOTE — Progress Notes (Signed)
Patient ID: Aliene Beamstiva Sproule, female   DOB: Aug 03, 1981, 35 y.o.   MRN: [id number] LABOR PROGRESS NOTE  Aliene Beamstiva Fahy is a 35 y.o. G1P0000 at 3170w4d  admitted for IOL for gHTN  Subjective: Doing well, comfortable  Objective: BP 119/74   Pulse 93   Temp 98.4 F (36.9 C) (Oral)   Resp 16   Ht 5\' 3"  (1.6 m)   Wt (!) 196.9 kg (434 lb)   LMP 08/21/2015   BMI 76.88 kg/m  or  Vitals:   05/14/16 0402 05/14/16 0501 05/14/16 0601 05/14/16 0702  BP: 124/86 125/72 128/76 119/74  Pulse: 90 85 79 93  Resp: 16 16 18 16   Temp:    98.4 F (36.9 C)  TempSrc:    Oral  Weight:      Height:         Dilation: Closed Effacement (%): Thick Cervical Position: Anterior Station: Ballotable Presentation: Vertex Exam by:: Cres-Dishman, CNM FHT: 125 baseline, moderate variability, +accelerations, no decelerations Uterine activity: q2-274min  Labs: Lab Results  Component Value Date   WBC 8.5 05/13/2016   HGB 11.6 (L) 05/13/2016   HCT 32.5 (L) 05/13/2016   MCV 68.1 (L) 05/13/2016   PLT 182 05/13/2016    Patient Active Problem List   Diagnosis Date Noted  . GBS bacteriuria 04/20/2016  . Supervision of elderly primigravida in third trimester 04/06/2016  . Advanced maternal age, 1st pregnancy 03/15/2016  . Abnormal maternal serum screening test 03/15/2016  . Morbid obesity with BMI of 50.0-59.9, adult (HCC) 05/09/2014  . Allergic rhinitis, cause unspecified 03/02/2013    Assessment / Plan: 35 y.o. G1P0000 at 3170w4d here for IOL for gHTN  Labor: cytotec Fetal Wellbeing:  Category I Pain Control:  Planning on epidural Anticipated MOD:  SVD  Leland HerElsia J Tita Terhaar, DO PGY-1 9/22/20179:19 AM

## 2016-05-14 NOTE — Anesthesia Preprocedure Evaluation (Signed)
Anesthesia Evaluation  Patient identified by MRN, date of birth, ID band Patient awake    Reviewed: Allergy & Precautions, H&P , NPO status , Patient's Chart, lab work & pertinent test results, reviewed documented beta blocker date and time   Airway Mallampati: II  TM Distance: >3 FB Neck ROM: full   Comment: Good neck extension, long thyromental distance, good jaw protrusion Dental no notable dental hx.    Pulmonary former smoker,    Pulmonary exam normal breath sounds clear to auscultation       Cardiovascular negative cardio ROS Normal cardiovascular exam Rhythm:regular Rate:Normal     Neuro/Psych negative neurological ROS  negative psych ROS   GI/Hepatic negative GI ROS, Neg liver ROS,   Endo/Other  Morbid obesity  Renal/GU negative Renal ROS  negative genitourinary   Musculoskeletal   Abdominal   Peds  Hematology negative hematology ROS (+)   Anesthesia Other Findings Pregnancy - Marissa Lowe is an IOL for gHTN, she is super super morbidly obese but has reassuring airway and an 18G IV in right forearm  Platelets and allergies reviewed Denies active cardiac or pulmonary symptoms  Denies blood thinning medications, bleeding disorders, hypertension, asthma, supine hypotension syndrome, previous anesthesia difficulties   Reproductive/Obstetrics (+) Pregnancy                             Anesthesia Physical Anesthesia Plan  ASA: III  Anesthesia Plan: Epidural   Post-op Pain Management:    Induction:   Airway Management Planned:   Additional Equipment:   Intra-op Plan:   Post-operative Plan:   Informed Consent: I have reviewed the patients History and Physical, chart, labs and discussed the procedure including the risks, benefits and alternatives for the proposed anesthesia with the patient or authorized representative who has indicated his/her understanding and acceptance.      Plan Discussed with:   Anesthesia Plan Comments:         Anesthesia Quick Evaluation

## 2016-05-14 NOTE — Consult Note (Signed)
Neonatology Note:   Attendance at C-section:    I was asked by Dr. Despina HiddenEure to attend this primary C[facility]S at term [facility] to labor. The mother is a G1P0 B pos, GBS positive with obesity and gestational HTN, being induced. ROM at delivery, fluid clear. She was afebrile during labor and got one dose of Gentamicin < 1 hour before delivery. Infant vigorous with good spontaneous cry and tone. Needed no suctioning. Ap 9/9. Lungs clear to ausc in DR. To CN to care of Pediatrician.   Doretha Souhristie C. Ercell Perlman, MD

## 2016-05-15 ENCOUNTER — Encounter (HOSPITAL_COMMUNITY): Payer: Self-pay | Admitting: Obstetrics and Gynecology

## 2016-05-15 LAB — CBC
HCT: 29.1 % — ABNORMAL LOW (ref 36.0–46.0)
HEMOGLOBIN: 10.4 g/dL — AB (ref 12.0–15.0)
MCH: 24.2 pg — AB (ref 26.0–34.0)
MCHC: 35.7 g/dL (ref 30.0–36.0)
MCV: 67.7 fL — AB (ref 78.0–100.0)
Platelets: 171 10*3/uL (ref 150–400)
RBC: 4.3 MIL/uL (ref 3.87–5.11)
RDW: 16.8 % — ABNORMAL HIGH (ref 11.5–15.5)
WBC: 8.7 10*3/uL (ref 4.0–10.5)

## 2016-05-15 MED ORDER — INFLUENZA VAC SPLIT QUAD 0.5 ML IM SUSY
0.5000 mL | PREFILLED_SYRINGE | INTRAMUSCULAR | Status: AC
  Administered 2016-05-16: 0.5 mL via INTRAMUSCULAR
  Filled 2016-05-15: qty 0.5

## 2016-05-15 NOTE — Progress Notes (Signed)
Subjective: Postpartum Day #1: Cesarean Delivery/BTS Patient reports tolerating PO and + flatus; just recently had foley removed- has not needed to void yet; has not ambulated; plans to order regular diet for breakfast; breast and bottlefeeding  Objective: Vital signs in last 24 hours: Temp:  [97.6 F (36.4 C)-99.3 F (37.4 C)] 98.4 F (36.9 C) (09/23 0618) Pulse Rate:  [72-96] 81 (09/23 0618) Resp:  [10-29] 20 (09/23 0618) BP: (110-137)/(55-81) 124/69 (09/23 0618) SpO2:  [96 %-100 %] 97 % (09/23 0618)  Physical Exam:  General: alert, cooperative and no distress Lochia: appropriate Uterine Fundus: firm Incision: PICO present and intact DVT Evaluation: No evidence of DVT seen on physical exam.   Recent Labs  05/14/16 1838 05/15/16 0612  HGB 11.4* 10.4*  HCT 32.0* 29.1*    Assessment/Plan: Status post Cesarean section. Doing well postoperatively.  Continue current care. Anticipate d/c in AM 9/24  Cam HaiSHAW, Linville Decarolis CNM 05/15/2016, 9:19 AM

## 2016-05-15 NOTE — Anesthesia Postprocedure Evaluation (Signed)
Anesthesia Post Note  Patient: Marissa Lowe  Procedure(s) Performed: Procedure(s) (LRB): CESAREAN SECTION (N/A)  Patient location during evaluation: [facility] Anesthesia Type: Spinal Level of consciousness: awake and alert and oriented Pain management: pain level controlled Vital Signs Assessment: post-procedure vital signs reviewed and stable Respiratory status: spontaneous breathing, nonlabored ventilation and respiratory function stable Cardiovascular status: stable Postop Assessment: no backache, no headache, no signs of nausea or vomiting and adequate PO intake Anesthetic complications: no (no residual numbness, up walking)     Last Vitals:  Vitals:   05/15/16 0230 05/15/16 0618  BP: 115/64 124/69  Pulse: 84 81  Resp: 20 20  Temp: 36.7 C 36.9 C    Last Pain:  Vitals:   05/15/16 0618  TempSrc: Oral  PainSc:    Pain Goal:                 Madison HickmanGREGORY,Durene Dodge

## 2016-05-15 NOTE — Lactation Note (Signed)
This note was copied from a baby's chart. Lactation Consultation Note  P1, Baby 12 hours old.  Baby has breastfed, voided, stooled numerous times since birth. Reviewed hand expression with mother. Assisted with latching in football hold.  Taught family member how to flange bottom lip for improved comfort.  Sucks and swallows observed. Encouraged mother to massage/compress breast during feeding. Reviewed basics.  Mom encouraged to feed baby 8-12 times/24 hours and with feeding cues.  Mom made aware of O/P services, breastfeeding support groups, community resources, and our phone # for post-discharge questions.    Patient Name: Marissa Lowe ZOXWR'UToday's Date: 05/15/2016 Reason for consult: Initial assessment   Maternal Data Has patient been taught Hand Expression?: Yes Does the patient have breastfeeding experience prior to this delivery?: No  Feeding Feeding Type: Breast Fed Length of feed: 45 min  LATCH Score/Interventions Latch: Grasps breast easily, tongue down, lips flanged, rhythmical sucking.  Audible Swallowing: Spontaneous and intermittent Intervention(s): Skin to skin  Type of Nipple: Everted at rest and after stimulation  Comfort (Breast/Nipple): Soft / non-tender     Hold (Positioning): Assistance needed to correctly position infant at breast and maintain latch. Intervention(s): Breastfeeding basics reviewed;Support Pillows;Position options;Skin to skin  LATCH Score: 9  Lactation Tools Discussed/Used     Consult Status Consult Status: Follow-up Date: 05/16/16 Follow-up type: In-patient    Dahlia ByesBerkelhammer, [facility] 05/15/2016, 9:01 AM

## 2016-05-15 NOTE — Addendum Note (Signed)
Addendum  created 05/15/16 0754 by Shanon PayorSuzanne M Maurio Baize, CRNA   Sign clinical note

## 2016-05-16 NOTE — Lactation Note (Signed)
This note was copied from a baby's chart. Lactation Consultation Note  Patient Name: Marissa Lowe [id number]'XToday's Date: 05/16/2016 Reason for consult: Follow-up assessment Infant is 3438 hours old and seen by Lactation for follow-up assessment. Per nurse, mom had decided to only formula fed so LC went in just to see if she had any question. Mom stated she may BF later once her milk comes in but for now is doing formula. Mom stated she was BF but was concerned that baby wasn't getting any milk so gave formula and he took 18mL at 0200 & then 20mL at 0510 and has slept a lot since then so believed he was not getting any breastmilk. Discussed stomach size & how she may not feel an increase in her breast size yet but that he can still get milk and her latch scores were 8x2 & 9. Discussed how she can continue doing both BF & formula if she decides to and to ask for assistance if she needs any help later. Mom does not have WIC but may apply after discharge. Mom reports no questions at this time.   Maternal Data    Feeding    LATCH Score/Interventions                      Lactation Tools Discussed/Used WIC Program: No (may apply)   Consult Status Consult Status: PRN Date: 05/17/16 Follow-up type: In-patient    Oneal GroutLaura C Jama Mcmiller 05/16/2016, 11:01 AM

## 2016-05-16 NOTE — Progress Notes (Signed)
Subjective: Postpartum Day 2: Cesarean Delivery/BTL Patient reports not problems this Am. Ambulating in room, voiding, tolerating diet. Pain controlled. Bleeding decreasing.   Objective: Vital signs in last 24 hours: Temp:  [98.1 F (36.7 C)-98.9 F (37.2 C)] 98.1 F (36.7 C) (09/24 0513) Pulse Rate:  [79-107] 93 (09/24 0513) Resp:  [17-20] 17 (09/24 0513) BP: (103-129)/(51-74) 124/74 (09/24 0513) SpO2:  [97 %-100 %] 98 % (09/24 0513)  Physical Exam:  General: alert Lochia: appropriate Uterine Fundus: firm Incision: PICO intact DVT Evaluation: No evidence of DVT seen on physical exam.   Recent Labs  05/14/16 1838 05/15/16 0612  HGB 11.4* 10.4*  HCT 32.0* 29.1*    Assessment/Plan: Status post Cesarean section. Doing well postoperatively.  Continue current care. Plan for d/c home tomorrow. Pt encouraged to ambulate in hallway today  Hermina StaggersMichael L Buna Cuppett 05/16/2016, 7:00 AM

## 2016-05-17 DIAGNOSIS — Z98891 History of uterine scar from previous surgery: Secondary | ICD-10-CM

## 2016-05-17 DIAGNOSIS — Z9851 Tubal ligation status: Secondary | ICD-10-CM

## 2016-05-17 MED ORDER — DOCUSATE SODIUM 100 MG PO CAPS: 100.0000 mg | ORAL_CAPSULE | Freq: Two times a day (BID) | ORAL | 2 refills | Status: DC

## 2016-05-17 MED ORDER — OXYCODONE-ACETAMINOPHEN 5-325 MG PO TABS: 1.0000 | ORAL_TABLET | ORAL | 0 refills | Status: DC | PRN

## 2016-05-17 NOTE — Discharge Summary (Signed)
OB Discharge Summary     Patient Name: Marissa Lowe DOB: Feb 06, 1981 MRN: [id number]  Date of admission: 05/13/2016 Delivering MD: Duane Lope H   Date of discharge: 05/17/2016  Admitting diagnosis: INDUCTION Intrauterine pregnancy: [redacted]w[redacted]d     Secondary diagnosis:  Principal Problem:   S/P cesarean section Active Problems:   BMI 70 and over, adult [facility])   Status post tubal ligation  Additional problems: gestational HTN, AMA     Discharge diagnosis: Term Pregnancy Delivered                                                                                                Post partum procedures:none  Augmentation: AROM and Cytotec  Complications: None  Hospital course:  Induction of Labor With Cesarean Section  35 y.o. yo G1P1001 at [redacted]w[redacted]d was admitted to the hospital 05/13/2016 for induction of labor. Patient had a labor course significant for NRFHT. The patient went for cesarean section due to Non-Reassuring FHR, and delivered a Viable infant, Membrane Rupture Time/Date: )8:03 PM ,05/14/2016 . She also underwent tubal ligation.  Details of operation can be found in separate operative Note.  Patient had an uncomplicated postpartum course. She is ambulating, tolerating a regular diet, passing flatus, and urinating well.  Patient is discharged home in stable condition on 05/17/16.                                     Physical exam Vitals:   05/16/16 1200 05/16/16 1738 05/16/16 2109 05/17/16 0548  BP: 122/87 135/74 137/63 135/79  Pulse: 95 88 (!) 101 85  Resp: 18 18 18 18   Temp: 98.4 F (36.9 C) 98.7 F (37.1 C) 98.1 F (36.7 C) 98.6 F (37 C)  TempSrc: Oral Oral Oral Oral  SpO2: 100% 100% 100% 98%  Weight:      Height:       General: alert, cooperative and no distress Lochia: appropriate Uterine Fundus: firm Incision: Dressing is clean, dry, and intact DVT Evaluation: No evidence of DVT seen on physical exam. No cords or calf tenderness. No significant calf/ankle  edema. Labs: Lab Results  Component Value Date   WBC 8.7 05/15/2016   HGB 10.4 (L) 05/15/2016   HCT 29.1 (L) 05/15/2016   MCV 67.7 (L) 05/15/2016   PLT 171 05/15/2016   CMP Latest Ref Rng & Units 05/13/2016  Glucose 65 - 99 mg/dL 132(G)  BUN 6 - 20 mg/dL 10  Creatinine 4.01 - 0.27 mg/dL 2.53  Sodium 664 - 403 mmol/L 137  Potassium 3.5 - 5.1 mmol/L 3.9  Chloride 101 - 111 mmol/L 105  CO2 22 - 32 mmol/L 25  Calcium 8.9 - 10.3 mg/dL 9.4  Total Protein 6.5 - 8.1 g/dL 6.7  Total Bilirubin 0.3 - 1.2 mg/dL 0.3  Alkaline Phos 38 - 126 U/L 128(H)  AST 15 - 41 U/L 14(L)  ALT 14 - 54 U/L 12(L)    Discharge instruction: per After Visit Summary and "Baby and Me Booklet".  After visit meds:  Medication List    TAKE these medications   acetaminophen 500 MG tablet Commonly known as:  TYLENOL Take 500-1,000 mg by mouth every 6 (six) hours as needed for mild pain, moderate pain or headache.   cholecalciferol 1000 units tablet Commonly known as:  VITAMIN D Take 1,000 Units by mouth at bedtime.   prenatal multivitamin Tabs tablet Take 1 tablet by mouth at bedtime.       Diet: routine diet  Activity: Advance as tolerated. Pelvic rest for 6 weeks.   Outpatient follow up:6 weeks Follow up Appt: Future Appointments Date Time Provider Department Center  06/08/2016 4:00 PM Roe Coombsachelle A Denney, CNM [facility]   Follow up Visit:No Follow-up on file.  Postpartum contraception: Tubal Ligation  Newborn Data: Live born female  Birth Weight: 7 lb 9.9 oz (3455 g) APGAR: 9, 9  Baby Feeding: Bottle and Breast Disposition:home with mother   05/17/2016 Frederik PearJulie P Degele, MD   Midwife attestation I have seen and examined this patient and agree with above documentation in the resident's note.   [name] is a 35 y.o. G1P1001 s/p pLTCS.   Pain is well controlled.  Plan for birth control is bilateral tubal ligation.  Method of Feeding: breast and bottle  PE:  BP 135/79 (BP Location:  Right Arm)   Pulse 85   Temp 98.6 F (37 C) (Oral)   Resp 18   Ht 5\' 3"  (1.6 m)   Wt (!) 434 lb (196.9 kg)   LMP 08/21/2015   SpO2 98%   Breastfeeding? Unknown   BMI 76.88 kg/m  Gen: well appearing Heart: reg rate Lungs: normal WOB Fundus firm Ext: soft, no pain, no edema   Recent Labs  05/14/16 1838 05/15/16 0612  HGB 11.4* 10.4*  HCT 32.0* 29.1*     Plan: discharge today - postpartum care discussed --PICO wound vac in place, pt to remove when pump turns off in 1 week. - [facility] for incision check and in 6 weeks for postpartum visit   LEFTWICH-KIRBY, LISA, CNM 1:31 PM

## 2016-05-17 NOTE — Progress Notes (Signed)
Pt ambulated out teaching complete  Family with pt  And baby

## 2016-05-17 NOTE — Discharge Instructions (Signed)
Cesarean Delivery, Care After  Refer to this sheet in the next few weeks. These instructions provide you with information on caring for yourself after your procedure. Your health care provider may also give you specific instructions. Your treatment has been planned according to current medical practices, but problems sometimes occur. Call your health care provider if you have any problems or questions after you go home.  HOME CARE INSTRUCTIONS   Only take over-the-counter or prescription medications as directed by your health care provider.   Do not drink alcohol, especially if you are breastfeeding or taking medication to relieve pain.   Do not chew or smoke tobacco.   Continue to use good perineal care. Good perineal care includes:    Wiping your perineum from front to back.    Keeping your perineum clean.   Check your surgical cut (incision) daily for increased redness, drainage, swelling, or separation of skin.   Clean your incision gently with soap and water every day, and then pat it dry. If your health care provider says it is okay, leave the incision uncovered. Use a bandage (dressing) if the incision is draining fluid or appears irritated. If the adhesive strips across the incision do not fall off within 7 days, carefully peel them off.   Hug a pillow when coughing or sneezing until your incision is healed. This helps to relieve pain.   Do not use tampons or douche until your health care provider says it is okay.   Shower, wash your hair, and take tub baths as directed by your health care provider.   Wear a well-fitting bra that provides breast support.   Limit wearing support panties or control-top hose.   Drink enough fluids to keep your urine clear or pale yellow.   Eat high-fiber foods such as whole grain cereals and breads, brown rice, beans, and fresh fruits and vegetables every day. These foods may help prevent or relieve constipation.   Resume activities such as climbing stairs,  driving, lifting, exercising, or traveling as directed by your health care provider.   Talk to your health care provider about resuming sexual activities. This is dependent upon your risk of infection, your rate of healing, and your comfort and desire to resume sexual activity.   Try to have someone help you with your household activities and your newborn for at least a few days after you leave the hospital.   Rest as much as possible. Try to rest or take a nap when your newborn is sleeping.   Increase your activities gradually.   Keep all of your scheduled postpartum appointments. It is very important to keep your scheduled follow-up appointments. At these appointments, your health care provider will be checking to make sure that you are healing physically and emotionally.  SEEK MEDICAL CARE IF:    You are passing large clots from your vagina. Save any clots to show your health care provider.   You have a foul smelling discharge from your vagina.   You have trouble urinating.   You are urinating frequently.   You have pain when you urinate.   You have a change in your bowel movements.   You have increasing redness, pain, or swelling near your incision.   You have pus draining from your incision.   Your incision is separating.   You have painful, hard, or reddened breasts.   You have a severe headache.   You have blurred vision or see spots.   You feel sad   or depressed.   You have thoughts of hurting yourself or your newborn.   You have questions about your care, the care of your newborn, or medications.   You are dizzy or light-headed.   You have a rash.   You have pain, redness, or swelling at the site of the removed intravenous access (IV) tube.   You have nausea or vomiting.   You stopped breastfeeding and have not had a menstrual period within 12 weeks of stopping.   You are not breastfeeding and have not had a menstrual period within 12 weeks of delivery.   You have a fever.  SEEK  IMMEDIATE MEDICAL CARE IF:   You have persistent pain.   You have chest pain.   You have shortness of breath.   You faint.   You have leg pain.   You have stomach pain.   Your vaginal bleeding saturates 2 or more sanitary pads in 1 hour.  MAKE SURE YOU:    Understand these instructions.   Will watch your condition.   Will get help right away if you are not doing well or get worse.     This information is not intended to replace advice given to you by your health care provider. Make sure you discuss any questions you have with your health care provider.     Document Released: 05/01/2002 Document Revised: 08/30/2014 Document Reviewed: 04/05/2012  Elsevier Interactive Patient Education 2016 Elsevier Inc.

## 2016-05-17 NOTE — Plan of Care (Signed)
Problem: Life Cycle: Goal: Risk for postpartum hemorrhage will decrease Outcome: Completed/Met Date Met: 05/17/16 Lochia scant in amount. Goal: Chance of risk for complications during the postpartum period will decrease Outcome: Progressing Vital signs within normal.  Ambulates in the halls without any problems.  Problem: Role Relationship: Goal: Ability to demonstrate positive interaction with newborn will improve Outcome: Progressing Bonding appropriately with newborn baby boy.  Problem: Skin Integrity: Goal: Demonstration of wound healing without infection will improve Outcome: Progressing No signs and symptoms of wound infection.

## 2016-05-18 LAB — RPR: RPR: NONREACTIVE

## 2016-05-21 ENCOUNTER — Encounter (HOSPITAL_COMMUNITY): Payer: Self-pay

## 2016-05-21 ENCOUNTER — Encounter: Payer: Self-pay | Admitting: Obstetrics

## 2016-05-21 ENCOUNTER — Ambulatory Visit (INDEPENDENT_AMBULATORY_CARE_PROVIDER_SITE_OTHER): Payer: Managed Care, Other (non HMO) | Admitting: Obstetrics

## 2016-05-21 ENCOUNTER — Inpatient Hospital Stay (HOSPITAL_COMMUNITY)
Admission: AD | Admit: 2016-05-21 | Discharge: 2016-05-21 | Disposition: A | Discharge status: Home / Self Care | Payer: Medicaid Other | Source: Ambulatory Visit | Attending: Obstetrics and Gynecology | Admitting: Obstetrics and Gynecology

## 2016-05-21 DIAGNOSIS — O99215 Obesity complicating the puerperium: Secondary | ICD-10-CM | POA: Diagnosis not present

## 2016-05-21 DIAGNOSIS — R030 Elevated blood-pressure reading, without diagnosis of hypertension: Secondary | ICD-10-CM | POA: Diagnosis not present

## 2016-05-21 DIAGNOSIS — Z87891 Personal history of nicotine dependence: Secondary | ICD-10-CM | POA: Diagnosis not present

## 2016-05-21 DIAGNOSIS — O9089 Other complications of the puerperium, not elsewhere classified: Secondary | ICD-10-CM | POA: Diagnosis not present

## 2016-05-21 DIAGNOSIS — Z4802 Encounter for removal of sutures: Secondary | ICD-10-CM

## 2016-05-21 DIAGNOSIS — Z6841 Body Mass Index (BMI) 40.0 and over, adult: Secondary | ICD-10-CM

## 2016-05-21 DIAGNOSIS — O900 Disruption of cesarean delivery wound: Secondary | ICD-10-CM | POA: Diagnosis present

## 2016-05-21 DIAGNOSIS — Z880 Allergy status to penicillin: Secondary | ICD-10-CM | POA: Insufficient documentation

## 2016-05-21 DIAGNOSIS — Z9889 Other specified postprocedural states: Secondary | ICD-10-CM | POA: Diagnosis not present

## 2016-05-21 MED ORDER — OXYCODONE-ACETAMINOPHEN 5-325 MG PO TABS: 2.0000 | ORAL_TABLET | ORAL | 0 refills | Status: DC | PRN

## 2016-05-21 MED ORDER — HYDROMORPHONE HCL 1 MG/ML IJ SOLN
2.0000 mg | Freq: Once | INTRAMUSCULAR | Status: AC
  Administered 2016-05-21: 2 mg via INTRAMUSCULAR
  Filled 2016-05-21: qty 2

## 2016-05-21 NOTE — Progress Notes (Signed)
Subjective:     Marissa Lowe is a 35 y.o. female who presents for a postpartum visit. She is 1 week postpartum following a low cervical transverse Cesarean section. I have fully reviewed the prenatal and intrapartum course. The delivery was at 38 gestational weeks. Outcome: primary cesarean section, low transverse incision. Anesthesia: epidural. Postpartum course has been normal. Baby's course has been normal. Baby is feeding by breast. Bleeding thin lochia. Bowel function is normal. Bladder function is normal. Patient is not sexually active. Contraception method is abstinence. Postpartum depression screening: negative.  Tobacco, alcohol and substance abuse history reviewed.  Adult immunizations reviewed including TDAP, rubella and varicella.  The following portions of the patient's history were reviewed and updated as appropriate: allergies, current medications, past family history, past medical history, past social history, past surgical history and problem list.  Review of Systems A comprehensive review of systems was negative.   Objective:    BP 138/90   Pulse 70   Wt (!) 425 lb (192.8 kg)   LMP 08/21/2015   BMI 75.29 kg/m   General:  alert and no distress   Breasts:  inspection negative, no nipple discharge or bleeding, no masses or nodularity palpable  Lungs: clear to auscultation bilaterally  Heart:  regular rate and rhythm, S1, S2 normal, no murmur, click, rub or gallop  Abdomen: normal findings: soft, non-tender and incision C, D, I.                                     Procedure Note:  Removal of Staples                                      Staples removed in routine fashion and steri strips applied.                                                                       Yuliza Cara A. Shalom Mcguiness MD   50% of 20 min visit spent on counseling and coordination of care.   Assessment:    POD# 7.  Primary LTCS for Arrest of 1st Stage of Labor.  Doing well.  Lactation Consult:  Breastfeeding  going well.    Plan:    1. Contraception: tubal ligation 2. Continue PNV's 3. Follow up in: 2 weeks or as needed.   Healthy lifestyle practices reviewed

## 2016-05-21 NOTE — MAU Provider Note (Signed)
History     CSN: [id number]  Arrival date and time: 05/21/16 1559   First Provider Initiated Contact with Patient 05/21/16 1639      No chief complaint on file.  HPI: [name] is POD # 7 from a LTCS. [facility] and had her staples removed. She noticed some bleeding from her incision. She denies any fever or chills.     Past Medical History:  Diagnosis Date  . Alpha thalassemia trait   . Arthritis of foot, left    (see imaging 02/2014)  . Morbid obesity with BMI of 70 and over, adult (HCC)   . Seasonal allergies   . Vitamin D deficiency     Past Surgical History:  Procedure Laterality Date  . CESAREAN SECTION N/A 05/14/2016   Procedure: CESAREAN SECTION;  Surgeon: [place] Bingharlie Pickens, MD;  [facility];  Service: Obstetrics;  Laterality: N/A;    Family History  Problem Relation Age of Onset  . Hypertension Sister   . Hypertension Brother   . Diabetes Mother   . Heart failure Father   . Hypertension Father   . Glaucoma Brother   . Cancer Neg Hx   . Stroke Neg Hx     Social History  Substance Use Topics  . Smoking status: Former Smoker    Types: Cigarettes    Quit date: 09/01/2009  . Smokeless tobacco: Never Used  . Alcohol use No    Allergies:  Allergies  Allergen Reactions  . Penicillins Hives and Other (See Comments)    Has patient had a PCN reaction causing immediate rash, facial/tongue/throat swelling, SOB or lightheadedness with hypotension: No Has patient had a PCN reaction causing severe rash involving mucus membranes or skin necrosis: No Has patient had a PCN reaction that required hospitalization No Has patient had a PCN reaction occurring within the last 10 years: No If all of the above answers are "NO", then may proceed with Cephalosporin use.    Prescriptions Prior to Admission  Medication Sig Dispense Refill Last Dose  . acetaminophen (TYLENOL) 500 MG tablet Take 500-1,000 mg by mouth every 6 (six) hours  as needed for mild pain, moderate pain or headache.    Past Week at Unknown time  . cholecalciferol (VITAMIN D) 1000 UNITS tablet Take 1,000 Units by mouth at bedtime.    05/12/2016 at Unknown time  . docusate sodium (COLACE) 100 MG capsule Take 1 capsule (100 mg total) by mouth 2 (two) times daily. 30 capsule 2   . oxyCODONE-acetaminophen (PERCOCET/ROXICET) 5-325 MG tablet Take 1-2 tablets by mouth every 4 (four) hours as needed for severe pain. 30 tablet 0   . Prenatal Vit-Fe Fumarate-FA (PRENATAL MULTIVITAMIN) TABS tablet Take 1 tablet by mouth at bedtime.   05/12/2016 at Unknown time    ROS Physical Exam   Blood pressure 146/92, pulse 71, temperature 98.1 F (36.7 C), resp. rate 18, unknown if currently breastfeeding.  Physical Exam  Constitutional: She appears well-developed and well-nourished.  Cardiovascular: Normal rate and regular rhythm.   Respiratory: Effort normal and breath sounds normal.  GI:    Open area on incision as drawn about 4 cm, small hematoma noted and evacuated, wound tracts entire length of incision depth of 4 cm as well.    MAU Course  Procedures  Wound was irrigated with half Ns/half H202, fascia probed and found to be intact, Wound packed with idoform gauze. ABD pad applied and secured with paper tape. Pt tolerated  procedure well    Assessment and Plan  C section wound seperation Morbid obesity Elevated BP  Wound packed as noted above. Pt to return through out the weekend for the same. Will send note to [facility] to set up Home Health starting next week. Renew pain medication. Recheck BP tomorrow . Suspect elevated today due to manipulation of wound.   Hermina Staggers 05/21/2016, 6:14 PM

## 2016-05-21 NOTE — Discharge Instructions (Signed)
Wound Care °Taking care of your wound properly can help to prevent pain and infection. It can also help your wound to heal more quickly.  °HOW TO CARE FOR YOUR WOUND  °· Take or apply over-the-counter and prescription medicines only as told by your health care provider. °· If you were prescribed antibiotic medicine, take or apply it as told by your health care provider. Do not stop using the antibiotic even if your condition improves. °· Clean the wound each day or as told by your health care provider. °¨ Wash the wound with mild soap and water. °¨ Rinse the wound with water to remove all soap. °¨ Pat the wound dry with a clean towel. Do not rub it. °· There are many different ways to close and cover a wound. For example, a wound can be covered with stitches (sutures), skin glue, or adhesive strips. Follow instructions from your health care provider about: °¨ How to take care of your wound. °¨ When and how you should change your bandage (dressing). °¨ When you should remove your dressing. °¨ Removing whatever was used to close your wound. °· Check your wound every day for signs of infection. Watch for: °¨ Redness, swelling, or pain. °¨ Fluid, blood, or pus. °· Keep the dressing dry until your health care provider says it can be removed. Do not take baths, swim, use a hot tub, or do anything that would put your wound underwater until your health care provider approves. °· Raise (elevate) the injured area above the level of your heart while you are sitting or lying down. °· Do not scratch or pick at the wound. °· Keep all follow-up visits as told by your health care provider. This is important. °SEEK MEDICAL CARE IF: °· You received a tetanus shot and you have swelling, severe pain, redness, or bleeding at the injection site. °· You have a fever. °· Your pain is not controlled with medicine. °· You have increased redness, swelling, or pain at the site of your wound. °· You have fluid, blood, or pus coming from your  wound. °· You notice a bad smell coming from your wound or your dressing. °SEEK IMMEDIATE MEDICAL CARE IF: °· You have a red streak going away from your wound. °  °This information is not intended to replace advice given to you by your health care provider. Make sure you discuss any questions you have with your health care provider. °  °Document Released: 05/18/2008 Document Revised: 12/24/2014 Document Reviewed: 08/05/2014 °Elsevier Interactive Patient Education ©2016 Elsevier Inc. ° °

## 2016-05-21 NOTE — MAU Note (Signed)
Pt presents to MAU with complaints of bleeding from cesarean section incision, Dr Clearance CootsHarper removed staples today

## 2016-05-22 ENCOUNTER — Inpatient Hospital Stay (HOSPITAL_COMMUNITY)
Admission: AD | Admit: 2016-05-22 | Discharge: 2016-05-22 | Disposition: A | Discharge status: Home / Self Care | Payer: Medicaid Other | Source: Ambulatory Visit | Attending: Obstetrics & Gynecology | Admitting: Obstetrics & Gynecology

## 2016-05-22 DIAGNOSIS — Z880 Allergy status to penicillin: Secondary | ICD-10-CM | POA: Diagnosis not present

## 2016-05-22 DIAGNOSIS — O900 Disruption of cesarean delivery wound: Secondary | ICD-10-CM | POA: Diagnosis present

## 2016-05-22 DIAGNOSIS — Z6841 Body Mass Index (BMI) 40.0 and over, adult: Secondary | ICD-10-CM | POA: Diagnosis not present

## 2016-05-22 DIAGNOSIS — O99215 Obesity complicating the puerperium: Secondary | ICD-10-CM | POA: Diagnosis not present

## 2016-05-22 DIAGNOSIS — Z87891 Personal history of nicotine dependence: Secondary | ICD-10-CM | POA: Insufficient documentation

## 2016-05-22 MED ORDER — NALBUPHINE HCL 10 MG/ML IJ SOLN
10.0000 mg | Freq: Once | INTRAMUSCULAR | Status: AC
  Administered 2016-05-22: 10 mg via INTRAMUSCULAR
  Filled 2016-05-22: qty 1

## 2016-05-22 NOTE — MAU Provider Note (Signed)
History   Pt is S/P c/s of 9/22 in today for wound packing and dressing change. Denies complaints. No fever.  CSN: [id number]  Arrival date & time 05/22/16  1320   None     No chief complaint on file.   HPI  Past Medical History:  Diagnosis Date  . Alpha thalassemia trait   . Arthritis of foot, left    (see imaging 02/2014)  . Morbid obesity with BMI of 70 and over, adult (HCC)   . Seasonal allergies   . Vitamin D deficiency     Past Surgical History:  Procedure Laterality Date  . CESAREAN SECTION N/A 05/14/2016   Procedure: CESAREAN SECTION;  Surgeon: [place] Bingharlie Pickens, MD;  [facility];  Service: Obstetrics;  Laterality: N/A;    Family History  Problem Relation Age of Onset  . Hypertension Sister   . Hypertension Brother   . Diabetes Mother   . Heart failure Father   . Hypertension Father   . Glaucoma Brother   . Cancer Neg Hx   . Stroke Neg Hx     Social History  Substance Use Topics  . Smoking status: Former Smoker    Types: Cigarettes    Quit date: 09/01/2009  . Smokeless tobacco: Never Used  . Alcohol use No    OB History    Gravida Para Term Preterm AB Living   1 1 1  0 0 1   SAB TAB Ectopic Multiple Live Births   0 0 0 0 1      Review of Systems  Constitutional: Negative.   HENT: Negative.   Eyes: Negative.   Respiratory: Negative.   Cardiovascular: Negative.   Gastrointestinal: Positive for abdominal pain.  Endocrine: Negative.   Genitourinary: Negative.   Musculoskeletal: Negative.   Skin: Negative.   Allergic/Immunologic: Negative.   Neurological: Negative.   Hematological: Negative.   Psychiatric/Behavioral: Negative.     Allergies  Penicillins  Home Medications    There were no vitals taken for this visit.  Physical Exam  Constitutional: She is oriented to person, place, and time. She appears well-developed and well-nourished.  HENT:  Head: Normocephalic.  Eyes: Conjunctivae are normal. Pupils are equal,  round, and reactive to light.  Neck: Normal range of motion. Neck supple.  Cardiovascular: Normal rate and regular rhythm.   Pulmonary/Chest: Effort normal and breath sounds normal.  Abdominal: Soft. Bowel sounds are normal. There is generalized tenderness.    Neurological: She is alert and oriented to person, place, and time.  Skin: Skin is warm and dry.  Psychiatric: She has a normal mood and affect. Her speech is normal and behavior is normal. Judgment and thought content normal. Cognition and memory are normal.    MAU Course  Procedures: Discussed s/s of infection with patient; wound packed with idoform gauze and ABD pad applied and secured with tape. Pt tolerated pocedure well.     Labs Reviewed - No data to display No results found.   No diagnosis found.    MDM  1. Post op c-section wound separation (4cmx4cm) 2. Morbid obesity  Plan: Pt to return to MAU over the weekend for wound packing as noted above.

## 2016-05-22 NOTE — Discharge Instructions (Signed)
Wound Dehiscence °Wound dehiscence is when a surgical cut (incision) breaks open and does not heal properly after surgery. It usually happens 7-10 days after surgery. This can be a serious condition. It is important to identify and treat this condition early.  °CAUSES  °Some common causes of wound dehiscence include: °· Stretching of the wound area. This may be caused by lifting, vomiting, violent coughing, or straining during bowel movements. °· Wound infection. °· Early stitch (suture) removal. °RISK FACTORS °Various things can increase your risk of developing wound dehiscence, including: °· Obesity. °· Lung disease. °· Smoking. °· Poor nutrition. °· Contamination during surgery. °SIGNS AND SYMPTOMS °· Bleeding from the wound. °· Pain. °· Fever. °· Wound starts breaking open. °DIAGNOSIS  °· Your health care provider may diagnose wound dehiscence by monitoring the incision and noting any changes in the wound. These changes can include an increase in drainage or pain. The health care provider may also ask you if you have noticed any stretching or tearing of the wound. °· Wound cultures may be taken to determine if there is an infection.  °· Imaging studies, such as an MRI scan or CT scan, may be done to determine if there is a collection of pus or fluid in the wound area. °TREATMENT °Treatment may include: °· Wound care. °· Surgical repair. °· Antibiotic medicine to treat or prevent infection. °· Medicines to reduce pain and swelling. °HOME CARE INSTRUCTIONS  °· Only take over-the-counter or prescription medicines for pain, discomfort, or fever as directed by your health care provider. Taking pain medicine 30 minutes before changing a bandage (dressing) can help relieve pain. °· Take your antibiotics as directed. Finish them even if you start to feel better. °· Gently wash the area with mild soap and water 2 times a day, or as directed. Rinse off the soap. Pat the area dry with a clean towel. Do not rub the wound.  This may cause bleeding. °· Follow your health care provider's instructions for how often you need to change the dressing and packing inside. Wash your hands well before and after changing your dressing. Apply a dressing to the wound as directed. °· Take showers. Do not soak the wound, bathe, swim, or use a hot tub until directed by your health care provider. °· Avoid exercises that make you sweat heavily. °· Use anti-itch medicine as directed by your health care provider. The wound may itch when it is healing. Do not pick or scratch at the wound. °· Do not lift more than 10 pounds (4.5 kg) until the wound is healed, or as directed by your health care provider. °· Keep all follow-up appointments as directed. °SEEK MEDICAL CARE IF: °· You have excessive bleeding from your surgical wound. °· Your wound does not seem to be healing properly. °· You have a fever. °SEEK IMMEDIATE MEDICAL CARE IF:  °· You have increased swelling or redness around the wound. °· You have increasing pain in the wound. °· You have an increasing amount of pus coming from the wound. °· Your wound breaks open farther. °MAKE SURE YOU:  °· Understand these instructions. °· Will watch your condition. °· Will get help right away if you are not doing well or get worse. °  °This information is not intended to replace advice given to you by your health care provider. Make sure you discuss any questions you have with your health care provider. °  °Document Released: 10/30/2003 Document Revised: 08/14/2013 Document Reviewed: 04/16/2013 °Elsevier Interactive Patient Education ©  2016 Elsevier Inc.  Incision Care An incision is when a surgeon cuts into your body. After surgery, the incision needs to be cared for properly to prevent infection.  HOW TO CARE FOR YOUR INCISION  Take medicines only as directed by your health care provider.  There are many different ways to close and cover an incision, including stitches, skin glue, and adhesive strips.  Follow your health care provider's instructions on:  Incision care.  Bandage (dressing) changes and removal.  Incision closure removal.  Do not take baths, swim, or use a hot tub until your health care provider approves. You may shower as directed by your health care provider.  Resume your normal diet and activities as directed.  Use anti-itch medicine (such as an antihistamine) as directed by your health care provider. The incision may itch while it is healing. Do not pick or scratch at the incision.  Drink enough fluid to keep your urine clear or pale yellow. SEEK MEDICAL CARE IF:   You have drainage, redness, swelling, or pain at your incision site.  You have muscle aches, chills, or a general ill feeling.  You notice a bad smell coming from the incision or dressing.  Your incision edges separate after the sutures, staples, or skin adhesive strips have been removed.  You have persistent nausea or vomiting.  You have a fever.  You are dizzy. SEEK IMMEDIATE MEDICAL CARE IF:   You have a rash.  You faint.  You have difficulty breathing. MAKE SURE YOU:   Understand these instructions.  Will watch your condition.  Will get help right away if you are not doing well or get worse.   This information is not intended to replace advice given to you by your health care provider. Make sure you discuss any questions you have with your health care provider.   Document Released: 02/26/2005 Document Revised: 08/30/2014 Document Reviewed: 10/03/2013 Elsevier Interactive Patient Education [employer] Elsevier Inc.

## 2016-05-23 ENCOUNTER — Inpatient Hospital Stay (HOSPITAL_COMMUNITY)
Admission: AD | Admit: 2016-05-23 | Discharge: 2016-05-23 | Disposition: A | Discharge status: Home / Self Care | Payer: Medicaid Other | Source: Ambulatory Visit | Attending: Obstetrics & Gynecology | Admitting: Obstetrics & Gynecology

## 2016-05-23 DIAGNOSIS — O900 Disruption of cesarean delivery wound: Secondary | ICD-10-CM

## 2016-05-23 MED ORDER — NALBUPHINE HCL 10 MG/ML IJ SOLN
10.0000 mg | Freq: Once | INTRAMUSCULAR | Status: AC
  Administered 2016-05-23: 10 mg via INTRAVENOUS
  Filled 2016-05-23: qty 1

## 2016-05-23 NOTE — MAU Note (Signed)
Pt tolerated dressing change well.

## 2016-05-23 NOTE — MAU Note (Signed)
Pt here for dressing change.

## 2016-05-23 NOTE — MAU Provider Note (Signed)
First Provider Initiated Contact with Patient 05/23/16 1453     HPI: Marissa Lowe is a 35 y.o. year old [id number] female at 9 days status post primary low treat transverse C-section for fetal indications on 05/14/2010 here for packing of separation of C-section incision. Staples and PICO wound vac removed 05/21/26. Pt came back to MAU later that afternoon for bleeding. Wound had opened 4 x 4 cm. Dr. Alysia PennaErvin examined and probed the wound and packed it. Pt returned for packing yesterday. No change. Returned today. Opening of incision measures 11 cm. Plan was to have pt see Dr. Clearance CootsHarper tomorrow and arrange home health.   Consulted w/ Dr. Penne LashLeggett who came and assessed wound. Tissue is healthy-appearing w/out signed of infections, but Dr. Penne LashLeggett recommends wound consult due to size and location of opening and pt's BMI pf 75.   [facility] without answer. Assess with Dr. Penne LashLeggett who recommends sending in basket message to providers at [facility] to arrange outpatient consult as soon as possible.  Discussed this with patient. She states [facility] is going to be closed tomorrow. [facility]basket [facility] and the number for [facility] given directly to patient for her to see if she can schedule appointment directly. We will have her return to maternity admissions tomorrow for wound packing and hopefully [facility] on Tuesday, October 3. May return to MAU. For worsening pain, bleeding, signs of infection.  [place] Syrena Burges, CNM 05/23/2016 3:32 PM

## 2016-05-23 NOTE — Discharge Instructions (Signed)
Wound Dehiscence °Wound dehiscence is when a surgical cut (incision) breaks open and does not heal properly after surgery. It usually happens 7-10 days after surgery. This can be a serious condition. It is important to identify and treat this condition early.  °CAUSES  °Some common causes of wound dehiscence include: °· Stretching of the wound area. This may be caused by lifting, vomiting, violent coughing, or straining during bowel movements. °· Wound infection. °· Early stitch (suture) removal. °RISK FACTORS °Various things can increase your risk of developing wound dehiscence, including: °· Obesity. °· Lung disease. °· Smoking. °· Poor nutrition. °· Contamination during surgery. °SIGNS AND SYMPTOMS °· Bleeding from the wound. °· Pain. °· Fever. °· Wound starts breaking open. °DIAGNOSIS  °· Your health care provider may diagnose wound dehiscence by monitoring the incision and noting any changes in the wound. These changes can include an increase in drainage or pain. The health care provider may also ask you if you have noticed any stretching or tearing of the wound. °· Wound cultures may be taken to determine if there is an infection.  °· Imaging studies, such as an MRI scan or CT scan, may be done to determine if there is a collection of pus or fluid in the wound area. °TREATMENT °Treatment may include: °· Wound care. °· Surgical repair. °· Antibiotic medicine to treat or prevent infection. °· Medicines to reduce pain and swelling. °HOME CARE INSTRUCTIONS  °· Only take over-the-counter or prescription medicines for pain, discomfort, or fever as directed by your health care provider. Taking pain medicine 30 minutes before changing a bandage (dressing) can help relieve pain. °· Take your antibiotics as directed. Finish them even if you start to feel better. °· Gently wash the area with mild soap and water 2 times a day, or as directed. Rinse off the soap. Pat the area dry with a clean towel. Do not rub the wound.  This may cause bleeding. °· Follow your health care provider's instructions for how often you need to change the dressing and packing inside. Wash your hands well before and after changing your dressing. Apply a dressing to the wound as directed. °· Take showers. Do not soak the wound, bathe, swim, or use a hot tub until directed by your health care provider. °· Avoid exercises that make you sweat heavily. °· Use anti-itch medicine as directed by your health care provider. The wound may itch when it is healing. Do not pick or scratch at the wound. °· Do not lift more than 10 pounds (4.5 kg) until the wound is healed, or as directed by your health care provider. °· Keep all follow-up appointments as directed. °SEEK MEDICAL CARE IF: °· You have excessive bleeding from your surgical wound. °· Your wound does not seem to be healing properly. °· You have a fever. °SEEK IMMEDIATE MEDICAL CARE IF:  °· You have increased swelling or redness around the wound. °· You have increasing pain in the wound. °· You have an increasing amount of pus coming from the wound. °· Your wound breaks open farther. °MAKE SURE YOU:  °· Understand these instructions. °· Will watch your condition. °· Will get help right away if you are not doing well or get worse. °  °This information is not intended to replace advice given to you by your health care provider. Make sure you discuss any questions you have with your health care provider. °  °Document Released: 10/30/2003 Document Revised: 08/14/2013 Document Reviewed: 04/16/2013 °Elsevier Interactive Patient Education ©  2016 Elsevier Inc. ° °

## 2016-05-24 ENCOUNTER — Inpatient Hospital Stay (HOSPITAL_COMMUNITY)
Admission: AD | Admit: 2016-05-24 | Discharge: 2016-05-24 | Disposition: A | Discharge status: Home / Self Care | Payer: Medicaid Other | Source: Ambulatory Visit | Attending: Obstetrics and Gynecology | Admitting: Obstetrics and Gynecology

## 2016-05-24 ENCOUNTER — Encounter (HOSPITAL_COMMUNITY): Payer: Self-pay

## 2016-05-24 DIAGNOSIS — Z4800 Encounter for change or removal of nonsurgical wound dressing: Secondary | ICD-10-CM

## 2016-05-24 DIAGNOSIS — O900 Disruption of cesarean delivery wound: Secondary | ICD-10-CM | POA: Diagnosis present

## 2016-05-24 DIAGNOSIS — O165 Unspecified maternal hypertension, complicating the puerperium: Secondary | ICD-10-CM

## 2016-05-24 DIAGNOSIS — Z4889 Encounter for other specified surgical aftercare: Secondary | ICD-10-CM

## 2016-05-24 MED ORDER — HYDROCHLOROTHIAZIDE 25 MG PO TABS: 25.0000 mg | ORAL_TABLET | Freq: Every day | ORAL | 0 refills | Status: DC

## 2016-05-24 NOTE — MAU Note (Signed)
Pt had c/section on 9/22. Pt had staples removed on 9/29 and the incision opened up and started bleeding. Pt has been here daily since then to have the incision drainged and packed.

## 2016-05-24 NOTE — Discharge Instructions (Signed)
Postpartum Hypertension  Postpartum hypertension is high blood pressure after pregnancy that remains higher than normal for more than two days after delivery. You may not realize that you have postpartum hypertension if your blood pressure is not being checked regularly. In some cases, postpartum hypertension will go away on its own, usually within a week of delivery. However, for some women, medical treatment is required to prevent serious complications, such as seizures or stroke.  The following things can affect your blood pressure:  · The type of delivery you had.  · Having received IV fluids or other medicines during or after delivery.  CAUSES   Postpartum hypertension may be caused by any of the following or by a combination of any of the following:  · Hypertension that existed before pregnancy (chronic hypertension).  · Gestational hypertension.  · Preeclampsia or eclampsia.  · Receiving a lot of fluid through an IV during or after delivery.  · Medicines.  · HELLP syndrome.  · Hyperthyroidism.  · Stroke.  · Other rare neurological or blood disorders.  In some cases, the cause may not be known.  RISK FACTORS  Postpartum hypertension can be related to one or more risk factors, such as:  · Chronic hypertension. In some cases, this may not have been diagnosed before pregnancy.  · Obesity.  · Type 2 diabetes.  · Kidney disease.  · Family history of preeclampsia.  · Other medical conditions that cause hormonal imbalances.  SIGNS AND SYMPTOMS  As with all types of hypertension, postpartum hypertension may not have any symptoms. Depending on how high your blood pressure is, you may experience:  · Headaches. These may be mild, moderate, or severe. They may also be steady, constant, or sudden in onset (thunderclap headache).  · Visual changes.  · Dizziness.  · Shortness of breath.  · Swelling of your hands, feet, lower legs, or face. In some cases, you may have swelling in more than one of these locations.  · Heart  palpitations or a racing heartbeat.  · Difficulty breathing while lying down.  · Decreased urination.  Other rare signs and symptoms may include:  · Sweating more than usual. This lasts longer than a few days after delivery.  · Chest pain.  · Sudden dizziness when you get up from sitting or lying down.  · Seizures.  · Nausea or vomiting.  · Abdominal pain.  DIAGNOSIS  The diagnosis of postpartum hypertension is made through a combination of physical examination findings and testing of your blood and urine. You may also have additional tests, such as a CT scan or an MRI, to check for other complications of postpartum hypertension.  TREATMENT  When blood pressure is high enough to require treatment, your options may include:  · Medicines to reduce blood pressure (antihypertensives). Tell your health care provider if you are breastfeeding or if you plan to breastfeed. There are many antihypertensive medicines that are safe to take while breastfeeding.  · Stopping medicines that may be causing hypertension.  · Treating medical conditions that are causing hypertension.  · Treating the complications of hypertension, such as seizures, stroke, or kidney problems.  Your health care provider will also continue to monitor your blood pressure closely and repeatedly until it is within a safe range for you.   HOME CARE INSTRUCTIONS  · Take medicines only as directed by your health care provider.  · Get regular exercise after your health care provider tells you that it is safe.  · Follow   your health care provider's recommendations on fluid and salt restrictions.  · Do not use any tobacco products, including cigarettes, chewing tobacco, or electronic cigarettes. If you need help quitting, ask your health care provider.  · Keep all follow-up visits as directed by your health care provider. This is important.  SEEK MEDICAL CARE IF:  · Your symptoms get worse.  · You have new symptoms, such as:    Headache.    Dizziness.    Visual  changes.  SEEK IMMEDIATE MEDICAL CARE IF:  · You develop a severe or sudden headache.  · You have seizures.  · You develop numbness or weakness on one side of your body.  · You have difficulty thinking, speaking, or swallowing.  · You develop severe abdominal pain.  · You develop difficulty breathing, chest pain, a racing heartbeat, or heart palpitations.  These symptoms may represent a serious problem that is an emergency. Do not wait to see if the symptoms will go away. Get medical help right away. Call your local emergency services (911 in the [place] Do not drive yourself to the hospital.     This information is not intended to replace advice given to you by your health care provider. Make sure you discuss any questions you have with your health care provider.     Document Released: 04/12/2014 Document Reviewed: 04/12/2014  Elsevier Interactive Patient Education ©2016 Elsevier Inc.

## 2016-05-24 NOTE — MAU Provider Note (Signed)
First Provider Initiated Contact with Patient 05/24/16 1830   HPI: Marissa Lowe is a 35 y.o. year old [id number] female at 10 days status post primary low transverse C-section and BTL for fetal indications on 05/14/2010 here for packing of separation of C-section incision. Preg c/b AMA, BMI 71, gHTN.  Staples and PICO wound vac removed 05/21/26. Pt came back to MAU later that afternoon for bleeding. Wound had opened 4 x 4 cm. Dr. Alysia PennaErvin examined and probed the wound and packed it, and she has been coming to MAU for dressing change until can by seen by [facility].  Patient seen yesterday in MAU and attempts to contact [facility] unsuccessful. Patient states [facility] call [facility] but they are booked up until 2wks and they offered [facility] but she didn't want to go there b/c she is unfamiliar with that area.   She denies any fevers, chills, nausea, HA, or visual changes.    Patient Vitals for the past 24 hrs:  BP Temp Temp src Pulse Resp SpO2 Height Weight  05/24/16 1953 145/88 98.2 F (36.8 C) Oral 79 17 - - -  05/24/16 1915 146/93 - - 61 - - - -  05/24/16 1900 152/78 - - 70 - - - -  05/24/16 1856 161/88 - - 75 - - - -  05/24/16 1846 - - - 76 - - - -  05/24/16 1845 156/93 - - 71 - - - -  05/24/16 1843 - 98.2 F (36.8 C) Oral - - - - -  05/24/16 1835 - - - 76 - - - -  05/24/16 1830 139/74 - - 75 - - - -  05/24/16 1818 - - - 70 - 100 % - -  05/24/16 1815 156/94 - - 72 - - - -  05/24/16 1803 - - - 70 - 100 % - -  05/24/16 1801 139/73 - - 72 - - - -  05/24/16 1757 149/82 98.4 F (36.9 C) Oral 68 20 - 5\' 3"  (1.6 m) (!) 192.8 kg (425 lb)  05/24/16 1756 - - - - - 100 % - -   NAD Underneath pannus, incision is open and approx 10cm in length by 4cm in width and 3-4cm in depth. Healthy granulation tissue noted and no e/o infection. Area probed and fascia is intact. Skin underneath pannus cleaned with betadine and opened incision irrigated and moist gauze sponge placed  in incision and ABD and tape applied over it. Patient tolerated this well.  CTAB No MRGs, normal s1 and s2 3+ b/l LE edema  In basket message sent to [facility] and CNM Denney who she is seeing on 10/3 to set up home health asap (likely needs a wound vac) and daily visits at [facility] for dressing changes in the interim  Pt put on hctz 25mg  qday to help with BP and edema   Cornelia Copaharlie Smera Guyette, Jr MD Attending Center for [facility](Faculty Practice)

## 2016-05-25 ENCOUNTER — Encounter: Payer: Self-pay | Admitting: Obstetrics and Gynecology

## 2016-05-25 ENCOUNTER — Ambulatory Visit: Payer: Medicaid Other | Admitting: Certified Nurse Midwife

## 2016-05-25 ENCOUNTER — Ambulatory Visit (INDEPENDENT_AMBULATORY_CARE_PROVIDER_SITE_OTHER): Payer: Medicaid Other | Admitting: Obstetrics and Gynecology

## 2016-05-25 VITALS — BP 130/86 | HR 81 | Temp 98.6°F | Wt >= 6400 oz

## 2016-05-25 DIAGNOSIS — Z6841 Body Mass Index (BMI) 40.0 and over, adult: Secondary | ICD-10-CM

## 2016-05-25 DIAGNOSIS — O900 Disruption of cesarean delivery wound: Secondary | ICD-10-CM | POA: Diagnosis not present

## 2016-05-25 NOTE — Progress Notes (Signed)
Post OP Note  Marissa Lowe is POD # 11 from LTCS. She develped wound seperation after skin clips were removed on POD # 7. Wound has been packed in MAU for the last few days. She denies any fever. Here today for wound check and placement of wound vac.   PE C section incision  Good granulation tissue noted, fascia remains intact Wound is aprox 10 cm x 3 cm x 3 cm, with majority of wound separation @ 7 cm.  Wound vac applied with [employer] rep. Good seal. [employer] rep reviewed wound vac with pt Marissa Lowe verbalized understanding  A/P C section wound dehiscence  Wound Vac in place. Reeval next week.

## 2016-05-25 NOTE — Progress Notes (Signed)
Patient is in office for pp check of incision and wound vac.

## 2016-05-26 NOTE — Op Note (Signed)
  Preoperative diagnosis:  1.  Intrauterine pregnancy at 39 4/[redacted] weeks gestation                                         2.  Gestational HTN                                         3.  Morbidly obese        4.  Non-reassuring fetal heart tones        5.  Undesired fertility  Postoperative diagnosis:  Same as above plus bilateral tubal ligation  Procedure:  Primary cesarean section  Surgeon:  Lazaro ArmsLuther H Eure MD  Assistant:  Jen MowElizabeth Mikyla Schachter, DO  Anesthesia: Epidural  Findings:  Over a low transverse incision was delivered a viable female with Apgars of 9 and 9 weighing 7 lbs. 9 oz. Uterus, tubes and ovaries were all normal.  Successful tubal ligation bilaterally was visualized. There were no other significant findings  Description of operation:  Patient was taken to the operating room and placed in the sitting position where she underwent a spinal anesthetic. She was then placed in the supine position with tilt to the left side. When adequate anesthetic level was obtained she was prepped and draped in usual sterile fashion and a Foley catheter was placed. A Pfannenstiel skin incision was made and carried down sharply to the rectus fascia which was scored in the midline extended laterally. The fascia was taken off the muscles both superiorly and without difficulty. The muscles were divided.  The peritoneal cavity was entered.  Bladder blade was placed, no bladder flap was created.  A low transverse hysterotomy incision was made and delivered a viable female  infant at 2839 4/7 with Apgars of 9 and 9 weighing7 lbs 9 oz.  Cord pH was obtained and was not obtained. The uterus was exteriorized. It was closed in 2 layers, the first being a running interlocking layer and the second being an imbricating layer using 0 monocryl on a CTX needle. There was good resulting hemostasis. The uterus tubes and ovaries were all normal.   Attention was then turned to the patient's uterus, and left fallopian tube was identified  and followed out to the fimbriated end.  A Filshie clip was placed on the left fallopian tube about 2 cm from the cornual attachment, with care given to incorporate the underlying mesosalpinx.  A similar process was carried out on the right side allowing for bilateral tubal sterilization.  Good hemostasis was noted overall.    Peritoneal cavity was irrigated vigorously. The muscles and peritoneum were reapproximated loosely. The fascia was closed using 0 Vicryl in running fashion. Subcutaneous tissue was made hemostatic and irrigated. The skin was closed using 4-0 Vicryl on a Keith needle in a subcuticular fashion.  Dermabond was placed for additional wound integrity and to serve as a barrier. The patient received Gentamycin and Clindamycin prophylactically. The patient was taken to the recovery room in good stable condition with all counts being correct x3.  EBL: 650 cc IVF: 2600 cc LR   Jen MowElizabeth Calee Nugent, DO OB Fellow  This note was a late entry.  Surgery performed 05/14/16.

## 2016-05-31 ENCOUNTER — Telehealth: Payer: Self-pay | Admitting: *Deleted

## 2016-05-31 NOTE — Telephone Encounter (Signed)
Erskine SquibbJane, home health nurse, called to office for update to plan of care for Ms Glassner.  States that pt dressing is currently being change 3x week, every Monday, Wed and Friday. Per Dr Clearance CootsHarper, plan of care is the same, continue to change 3x weekly.  Attempt to contact Erskine SquibbJane,  LM on VM making her aware of plan of care.

## 2016-06-02 ENCOUNTER — Ambulatory Visit (INDEPENDENT_AMBULATORY_CARE_PROVIDER_SITE_OTHER): Payer: Medicaid Other | Admitting: Obstetrics and Gynecology

## 2016-06-02 ENCOUNTER — Encounter: Payer: Self-pay | Admitting: Obstetrics and Gynecology

## 2016-06-02 VITALS — BP 133/91 | Ht 63.0 in | Wt 392.0 lb

## 2016-06-02 DIAGNOSIS — O900 Disruption of cesarean delivery wound: Secondary | ICD-10-CM

## 2016-06-02 MED ORDER — IBUPROFEN 800 MG PO TABS: 800.0000 mg | ORAL_TABLET | Freq: Three times a day (TID) | ORAL | 1 refills | Status: DC | PRN

## 2016-06-02 NOTE — Progress Notes (Signed)
Pt here for wound check. Pt with wound vac. HH seeing pt M/W/F. Saw today and changed out wound vac. Wound healing and smaller from last week. Some pain mainly on left side. No fevers  PE Obese female in NAD Abd obese soft. Wound vac in place  A/P C section wound dehiscence  Will continue with wound vac. Motrin for pain. Will see the pt on 06/06/16 and change out wound vac. HH notify not to change wound vac that day and to send supplies with pt to OV.

## 2016-06-08 ENCOUNTER — Ambulatory Visit: Payer: Self-pay | Admitting: Certified Nurse Midwife

## 2016-06-09 ENCOUNTER — Telehealth: Payer: Self-pay | Admitting: *Deleted

## 2016-06-09 NOTE — Telephone Encounter (Signed)
Home Health Nurse called to let provider know that patient has made excellant progress. Wound vac has done it's job and healing process is completely to the top now- less than .1. She has applied wet to dry dressing and wants order to discontinue the vac. Per provider- May discontinue vac and do wet to dry dressing. Patient has follow up appointment in office next week. Erskine SquibbJane will see patient for final wound evaluation and dressing instruction on Friday. Thanked her for taking such good care of our patient.

## 2016-06-16 ENCOUNTER — Encounter: Payer: Self-pay | Admitting: Obstetrics and Gynecology

## 2016-06-16 ENCOUNTER — Ambulatory Visit: Payer: Medicaid Other | Admitting: Obstetrics and Gynecology

## 2016-06-16 VITALS — BP 130/85 | HR 68 | Temp 98.0°F | Wt 393.6 lb

## 2016-06-16 DIAGNOSIS — O900 Disruption of cesarean delivery wound: Secondary | ICD-10-CM

## 2016-06-16 NOTE — Progress Notes (Signed)
Patient is in office to follow up and have wound checked.

## 2016-06-16 NOTE — Progress Notes (Signed)
Pt here for wound check. She is doing week Wound vac was d/c last week. Pt denies any F/C  PE  Wound healed except for small separation at skin edges. Good granulation tissue note No evidence of infection  A/P Wound check Pt instructed to clean wound daily with warm soap and water. May take showers Cover wound with gauze if needed F/U in 4 weeks for reevaluation and PP visit

## 2016-06-17 ENCOUNTER — Encounter: Payer: Self-pay | Admitting: *Deleted

## 2016-06-17 DIAGNOSIS — Z3493 Encounter for supervision of normal pregnancy, unspecified, third trimester: Secondary | ICD-10-CM

## 2016-06-28 ENCOUNTER — Encounter: Payer: Self-pay | Admitting: *Deleted

## 2016-07-13 ENCOUNTER — Encounter (HOSPITAL_COMMUNITY): Payer: Self-pay

## 2016-07-14 ENCOUNTER — Encounter: Payer: Self-pay | Admitting: Obstetrics and Gynecology

## 2016-07-14 ENCOUNTER — Ambulatory Visit (INDEPENDENT_AMBULATORY_CARE_PROVIDER_SITE_OTHER): Payer: Medicaid Other | Admitting: Obstetrics and Gynecology

## 2016-07-14 MED ORDER — AMLODIPINE BESYLATE 10 MG PO TABS: 10.0000 mg | ORAL_TABLET | Freq: Every day | ORAL | 3 refills | Status: DC

## 2016-07-14 NOTE — Progress Notes (Signed)
Subjective:     Marissa Lowe is a 35 y.o. female who presents for a postpartum visit. She is 8 weeks postpartum following a low cervical transverse Cesarean section. I have fully reviewed the prenatal and intrapartum course. The delivery was at 39.9 gestational weeks. Outcome: primary cesarean section, low transverse incision. Anesthesia: epidural. Postpartum course has been complicated by wound dehiscence. Baby's course has been uncomplicated. Baby is feeding by formula. Bleeding no bleeding. Bowel function is normal. Bladder function is normal. Patient is not sexually active. Contraception method is tubal ligation. Postpartum depression screening: negative.     Review of Systems Pertinent items are noted in HPI.   Objective:    There were no vitals taken for this visit.  General:  alert, cooperative and no distress   Breasts:  inspection negative, no nipple discharge or bleeding, no masses or nodularity palpable  Lungs: clear to auscultation bilaterally  Heart:  regular rate and rhythm  Abdomen: soft, non-tender; bowel sounds normal; no masses,  no organomegaly  Incision: no erythema, induration or drainage. Silver nitrate applied on left lateral aspect of incision where skin was not appropriately reapproximated   Vulva:  normal  Vagina: normal vagina, no discharge, exudate, lesion, or erythema  Cervix:  multiparous appearance  Corpus: normal size, contour, position, consistency, mobility, non-tender  Adnexa:  normal adnexa and no mass, fullness, tenderness  Rectal Exam: Not performed.        Assessment:     Normal postpartum exam. Pap smear not done at today's visit.   Plan:    1. Contraception: tubal ligation 2. Patient is medically cleared to resume all activities of daily living Patient with continued hypertension. Rx Norvasc provided and patient asked to follow up with PCP 3. Follow up in: 6 months or as needed.

## 2016-07-16 ENCOUNTER — Encounter (HOSPITAL_COMMUNITY): Payer: Self-pay

## 2016-08-03 ENCOUNTER — Encounter (HOSPITAL_COMMUNITY): Payer: Self-pay | Admitting: Family Medicine

## 2016-08-03 ENCOUNTER — Ambulatory Visit (HOSPITAL_COMMUNITY)
Admission: EM | Admit: 2016-08-03 | Discharge: 2016-08-03 | Disposition: A | Discharge status: Home / Self Care | Payer: Medicaid Other | Attending: Family Medicine | Admitting: Family Medicine

## 2016-08-03 DIAGNOSIS — M60242 Foreign body granuloma of soft tissue, not elsewhere classified, left hand: Secondary | ICD-10-CM

## 2016-08-03 NOTE — ED Provider Notes (Signed)
[facility]    CSN: [id number] Arrival date & time: 08/03/16  1450     History   Chief Complaint Chief Complaint  Patient presents with  . Cyst    HPI Marissa Lowe is a 35 y.o. female.   The history is provided by the patient.  Hand Pain  This is a new problem. The current episode started more than 1 week ago. The problem has been gradually worsening. Pertinent negatives include no chest pain and no abdominal pain. The symptoms are aggravated by bending.    Past Medical History:  Diagnosis Date  . Alpha thalassemia trait   . Arthritis of foot, left    (see imaging 02/2014)  . Morbid obesity with BMI of 70 and over, adult (HCC)   . Seasonal allergies   . Vitamin D deficiency     Patient Active Problem List   Diagnosis Date Noted  . Wound dehiscence, cesarean 05/25/2016  . S/P cesarean section 05/14/2016  . BMI 70 and over, adult [facility](HCC) 05/09/2014    Past Surgical History:  Procedure Laterality Date  . CESAREAN SECTION N/A 05/14/2016   Procedure: CESAREAN SECTION;  Surgeon: Duane LopeLuther Eure, MD;  [facility];  Service: Obstetrics;  Laterality: N/A;    OB History    Gravida Para Term Preterm AB Living   1 1 1  0 0 1   SAB TAB Ectopic Multiple Live Births   0 0 0 0 1       Home Medications    Prior to Admission medications   Medication Sig Start Date End Date Taking? Authorizing Provider  acetaminophen (TYLENOL) 500 MG tablet Take 500-1,000 mg by mouth every 6 (six) hours as needed for mild pain, moderate pain or headache.     Historical Provider, MD  amLODipine (NORVASC) 10 MG tablet Take 1 tablet (10 mg total) by mouth daily. 07/14/16   Peggy Constant, MD  cholecalciferol (VITAMIN D) 1000 UNITS tablet Take 1,000 Units by mouth at bedtime.     Historical Provider, MD  docusate sodium (COLACE) 100 MG capsule Take 1 capsule (100 mg total) by mouth 2 (two) times daily. Patient not taking: Reported on 07/14/2016 05/17/16   Misty StanleyLisa A  Leftwich-Kirby, CNM  hydrochlorothiazide (HYDRODIURIL) 25 MG tablet Take 1 tablet (25 mg total) by mouth daily. Patient not taking: Reported on 07/14/2016 05/24/16   Duane LopeJennifer I Rasch, NP  oxyCODONE-acetaminophen (PERCOCET/ROXICET) 5-325 MG tablet Take 2 tablets by mouth every 4 (four) hours as needed for severe pain. Patient not taking: Reported on 07/14/2016 05/21/16   Hermina StaggersMichael L Ervin, MD    Family History Family History  Problem Relation Age of Onset  . Hypertension Sister   . Hypertension Brother   . Diabetes Mother   . Heart failure Father   . Hypertension Father   . Glaucoma Brother   . Cancer Neg Hx   . Stroke Neg Hx     Social History Social History  Substance Use Topics  . Smoking status: Former Smoker    Types: Cigarettes    Quit date: 09/01/2009  . Smokeless tobacco: Never Used  . Alcohol use No     Allergies   Penicillins   Review of Systems Review of Systems  Cardiovascular: Negative for chest pain.  Gastrointestinal: Negative for abdominal pain.  Skin: Positive for wound.  All other systems reviewed and are negative.    Physical Exam Triage Vital Signs ED Triage Vitals  Enc Vitals Group     BP  08/03/16 1501 156/95     Pulse Rate 08/03/16 1501 72     Resp --      Temp 08/03/16 1501 98 F (36.7 C)     Temp Source 08/03/16 1501 Oral     SpO2 08/03/16 1501 99 %     Weight --      Height --      Head Circumference --      Peak Flow --      Pain Score 08/03/16 1508 8     Pain Loc --      Pain Edu? --      Excl. in GC? --    No data found.   Updated Vital Signs BP 156/95 (BP Location: Left Arm)   Pulse 72   Temp 98 F (36.7 C) (Oral)   LMP 07/01/2016   SpO2 99%   Visual Acuity Right Eye Distance:   Left Eye Distance:   Bilateral Distance:    Right Eye Near:   Left Eye Near:    Bilateral Near:     Physical Exam  Constitutional: She is oriented to person, place, and time. She appears well-developed and well-nourished. No distress.    Neurological: She is alert and oriented to person, place, and time.  Skin: Skin is warm and dry.  1cm granuloma to left hand hypothenar pad, no infection, redness or drainage.  Nursing note and vitals reviewed.    UC Treatments / Results  Labs (all labs ordered are listed, but only abnormal results are displayed) Labs Reviewed - No data to display  EKG  EKG Interpretation None       Radiology No results found.  Procedures Debridement Date/Time: 08/03/2016 3:35 PM Performed by: Linna HoffKINDL, Daveyon Kitchings D Authorized by: Bradd CanaryKINDL, Khyson Sebesta D  Consent: Verbal consent obtained. Risks and benefits: risks, benefits and alternatives were discussed Consent given by: patient Preparation: Patient was prepped and draped in the usual sterile fashion. Local anesthesia used: no  Anesthesia: Local anesthesia used: no  Sedation: Patient sedated: no Patient tolerance: Patient tolerated the procedure well with no immediate complications Comments: Clipped off lesion with sterile scissors.beatadine soaked, dsd,    (including critical care time)  Medications Ordered in UC Medications - No data to display   Initial Impression / Assessment and Plan / UC Course  I have reviewed the triage vital signs and the nursing notes.  Pertinent labs & imaging results that were available during my care of the patient were reviewed by me and considered in my medical decision making (see chart for details).  Clinical Course       Final Clinical Impressions(s) / UC Diagnoses   Final diagnoses:  None    New Prescriptions New Prescriptions   No medications on file     Linna HoffJames D Earle Troiano, MD 08/03/16 1539

## 2016-08-03 NOTE — ED Triage Notes (Signed)
Pt here for cyst to left hand x 2 week. sts very painful and draining.

## 2016-08-03 NOTE — Discharge Instructions (Signed)
Leave bandaged until thurs then ointment and bandage as needed, ok to wash as needed. Return if any problems.

## 2016-08-12 ENCOUNTER — Encounter: Payer: Self-pay | Admitting: Obstetrics and Gynecology

## 2017-03-26 ENCOUNTER — Encounter: Payer: Self-pay | Admitting: Family Medicine

## 2017-03-26 DIAGNOSIS — D563 Thalassemia minor: Secondary | ICD-10-CM | POA: Insufficient documentation

## 2017-03-26 NOTE — Progress Notes (Signed)
Chief Complaint  Patient presents with  . Annual Exam    physical , having left knee pain and left foot pain     Pinky Lowe is a 36 y.o. female who presents for a complete physical.  She has the following concerns:  Left knee and foot pain. Left foot and knee are both swollen by the end of the day when she gets off work.  It is painful. Tylenol didn't help.  Elevating the leg helped some.  Swelling is better in the mornings, but it is still painful. Foot pain is in the arch, heel.  First few steps in the morning are extremely painful (in the foot); denies knee pain in the morning.  She tried using Dr. Felicie Morn arch support, which has helped somewhat.  She hasn't tried any NSAIDs, just tylenol.    Denies locking or giving way of the knee. No known injury. No change in shoes or activity.  She had a c-section in September, complicated by wound dehiscence. She was treated for BV, trichomonas and yeast in March 2017. She didn't really have any symptoms at that time.  She reports her partner was treated.  She currently denies any vaginal discharge, itching, pelvic pain. She is no longer with the father of the baby (he didn't want to keep baby). Hasn't been in a sexual relationship since. During her pregnancy evaluation she was found to have alpha thalassemia trait.  She had hypertension develop during her pregnancy.  She is currently on amlodipine (started during pregnancy).  Reviewing chart, it looks like at one point she was on HCTZ, but it was changed, she reports due to it not working.  BP's at home run 120-156 (mostly 130's)/80's. Denies chest pain.  She is complaining of some headaches in forehead between her eyes, she thinks related to her sinuses. She has some postnasal drainage and congestion. Neti-pot helps.   She has a history of vitamin D deficiency. Vitamin D level in 04/2014 was 18.  Last check was at initial prenatal visit, in 10/2015, and level was 15.  It had been 11 at her physical  the month prior, and she had been told to take 2000 IU daily.  She continues to take 2000 IU of Vitamin D3 daily, in addition to her prenatal vitamins.   Obesity: Weight was 365# at her last physical, which was early in her pregnancy. She has fried foods only 1x/week (chicken or pork chops); has french fries 2x/week.  +regular soda, +sweet tea, juice 2x/day (snapple drinks). +water, 8-10 12 ounce bottles of water/day when at work. Walks 30 minutes after work 3x/week.   Immunization History  Administered Date(s) Administered  . Influenza,inj,Quad PF,36+ Mos 05/09/2014, 05/16/2016  . Influenza-Unspecified 05/24/2015  . Tdap 03/01/2013, 05/15/2016   h/o varicella in the past, + titer in 10/2015 with prenatal labs Last Pap smear: 10/2015 (during pregnancy), normal, no high risk HPV  (+trich, BV and yeast) Last mammogram: never  Last colonoscopy: never  Last DEXA: never  Dentist: twice yearly  Ophtho: never, trying to find one that takes her insurance (medicaid) Exercise: Walks 30 minutes after work 3 days/week. Lipid screen: Lab Results  Component Value Date   CHOL 178 10/13/2015   HDL 63 10/13/2015   LDLCALC 100 10/13/2015   TRIG 75 10/13/2015   CHOLHDL 2.8 10/13/2015   Past Medical History:  Diagnosis Date  . Alpha thalassemia trait   . Arthritis of foot, left    (see imaging 02/2014)  . Morbid obesity  with BMI of 70 and over, adult ([place])   . Seasonal allergies   . Trichomonal vaginitis 10/2015  . Vitamin D deficiency     Past Surgical History:  Procedure Laterality Date  . CESAREAN SECTION N/A 05/14/2016   Procedure: CESAREAN SECTION;  Surgeon: Tania Ade, MD;  Location: [facility];  Service: Obstetrics;  Laterality: N/A;    Social History   Social History  . Marital status: Single    Spouse name: N/A  . Number of children: N/A  . Years of education: N/A   Occupational History  . works in Medical sales representative room at [employer] Topics  . Smoking status: Former Smoker    Types: Cigarettes    Quit date: 09/01/2009  . Smokeless tobacco: Never Used  . Alcohol use No  . Drug use: No  . Sexual activity: Not Currently    Partners: Male    Birth control/ protection: Surgical     Comment: had tubal ligation at time of her c-section   Other Topics Concern  . Not on file   Social History Narrative   Lives with her son (born 04/2016), and her sister.  No tobacco exposure.   She is no longer with the father of the baby (didn't want her to keep it), they had been together for 3 years.   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       Family History  Problem Relation Age of Onset  . Hypertension Sister   . Hypertension Brother   . Diabetes Mother   . Heart failure Father   . Hypertension Father   . Glaucoma Brother   . Cancer Neg Hx   . Stroke Neg Hx     Outpatient Encounter Prescriptions as of 03/28/2017  Medication Sig  . acetaminophen (TYLENOL) 500 MG tablet Take 500-1,000 mg by mouth every 6 (six) hours as needed for mild pain, moderate pain or headache.   Marland Kitchen amLODipine (NORVASC) 10 MG tablet Take 1 tablet (10 mg total) by mouth daily.  . cholecalciferol (VITAMIN D) 1000 UNITS tablet Take 1,000 Units by mouth at bedtime.   . Prenatal Vit-Fe Fumarate-FA (PRENATAL VITAMIN PO) Take 1 tablet by mouth daily.  . [DISCONTINUED] docusate sodium (COLACE) 100 MG capsule Take 1 capsule (100 mg total) by mouth 2 (two) times daily. (Patient not taking: Reported on 07/14/2016)  . [DISCONTINUED] hydrochlorothiazide (HYDRODIURIL) 25 MG tablet Take 1 tablet (25 mg total) by mouth daily. (Patient not taking: Reported on 07/14/2016)  . [DISCONTINUED] oxyCODONE-acetaminophen (PERCOCET/ROXICET) 5-325 MG tablet Take 2 tablets by mouth every 4 (four) hours as needed for severe pain. (Patient not taking: Reported on 07/14/2016)   No facility-administered encounter medications on file as of 03/28/2017.     Allergies   Allergen Reactions  . Penicillins Hives and Other (See Comments)    Has patient had a PCN reaction causing immediate rash, facial/tongue/throat swelling, SOB or lightheadedness with hypotension: No Has patient had a PCN reaction causing severe rash involving mucus membranes or skin necrosis: No Has patient had a PCN reaction that required hospitalization No Has patient had a PCN reaction occurring within the last 10 years: No If all of the above answers are "NO", then may proceed with Cephalosporin use.    ROS: The patient denies anorexia, fever, vision changes, decreased hearing, ear pain, sore throat, breast concerns, chest pain, palpitations, lightheadedness, syncope, dyspnea on exertion, cough, swelling, nausea, vomiting, diarrhea, constipation, abdominal pain, melena, hematochezia,  indigestion/heartburn, hematuria, incontinence, dysuria, vaginal discharge, odor or itch, genital lesions,numbness, tingling, weakness, tremor, suspicious skin lesions, depression, anxiety, abnormal bleeding/bruising, or enlarged lymph nodes.  Left knee and left foot pain as per HPI. +weight gain Tired and achey muscles after work Some congestion, year-round allergies. Sinus headaches.   PHYSICAL EXAM:  BP (!) 142/84 (BP Location: Left Arm, Patient Position: Sitting, Cuff Size: Large)   Pulse 85   Ht '5\' 5"'  (1.651 m)   Wt (!) 401 lb 12.8 oz (182.3 kg)   LMP 03/20/2017   SpO2 99%   BMI 66.86 kg/m   132/82 on repeat by MD  Wt Readings from Last 3 Encounters:  03/28/17 (!) 401 lb 12.8 oz (182.3 kg)  07/14/16 (!) 409 lb 3.2 oz (185.6 kg)  06/16/16 (!) 393 lb 9.6 oz (178.5 kg)     General Appearance:  Alert, cooperative, no distress, appears stated age, morbidly obese   Head:  Normocephalic, without obvious abnormality, atraumatic.  Eyes:  PERRL, conjunctiva/corneas clear, EOM's intact, fundi benign  Ears:  Normal TM's and external ear canals  Nose:  Nares normal, mucosa is moderately  edematous bilaterally; no purulence or erythema. +tenderness over bilateral frontal sinuses  Throat:  Lips and tongue normal; teeth and gums normal.   Neck:  Supple, no lymphadenopathy; thyroid: no enlargement/tenderness/nodules; no carotid bruit or JVD   Back:  Spine nontender, no curvature, ROM normal, no CVA tenderness   Lungs:  Clear to auscultation bilaterally without wheezes, rales or ronchi; respirations unlabored   Chest Wall:  No tenderness or deformity   Heart:  Regular rate and rhythm, S1 and S2 normal, no murmur, rub  or gallop   Breast Exam:  No tenderness, masses, or nipple discharge or inversion. No axillary lymphadenopathy   Abdomen:  Soft, non-tender, nondistended, normoactive bowel sounds, no masses, no hepatosplenomegaly. Obese. WHSS low transverse, without rash  Genitalia:  Normal external genitalia.  Bimanual exam was limited due to body habitus.  Nontender, no masses could be appreciated.  No cervical motion tenderness. No abnormal discharge.  Pap not performed  Rectal:  Not performed due to age<40 and no related complaints   Extremities:  No clubbing, cyanosis or edema. Tender at anteromedial calcaneous on the left, extending slightly into arch. No erythema, warmth.  L knee--FROM, no crepitus, warmth.  No effusion appreciated.  No pain with varus/valgus stress, no bony tenderness  Pulses:  2+ and symmetric all extremities   Skin:  Skin color, texture, turgor normal, no rashes or lesions. +tatoos   Lymph nodes:  Cervical, supraclavicular, and axillary nodes normal   Neurologic:  CNII-XII intact, normal strength, sensation and gait; reflexes 2+ and symmetric throughout    Psych:  Normal mood, affect, hygiene and grooming. Slightly tearful when asked about father of baby, but full range of affect.  Normal urine dip   ASSESSMENT/PLAN:   Annual physical exam - Plan: POCT Urinalysis DIP (Proadvantage Device), Comprehensive  metabolic panel, CBC with Differential/Platelet, VITAMIN D 25 Hydroxy (Vit-D Deficiency, Fractures), TSH  Vitamin D deficiency - Plan: VITAMIN D 25 Hydroxy (Vit-D Deficiency, Fractures)  Morbid obesity with body mass index of 60.0-69.9 in adult [facility]) - counseled extensively re: dietary changes, risks of obesity, exercise  Alpha thalassemia trait  Essential hypertension - borderline. Low sodium diet reviewed, weight loss, exercise. Continue amlodipine. f/u in 3 mos on BP, weight - Plan: Comprehensive metabolic panel  Weight gain - Plan: TSH  Plantar fasciitis of left foot - NSAIDs, PT, arch support.  refer to podiatrist if not improving - Plan: naproxen (NAPROSYN) 500 MG tablet  Acute pain of left knee - normal exam (somewhat limited); weight loss will help. NSAID course  Discussed monthly self breast exams and yearly mammograms after the age of 71; at least 30 minutes of aerobic activity at least 5 days/week, weight-bearing exercise at least 2x/week; proper sunscreen use reviewed; healthy diet, including goals of calcium and vitamin D intake and alcohol recommendations (less than or equal to 1 drink/day) reviewed; regular seatbelt use; changing batteries in smoke detectors. Immunization recommendations discussed, UTD; continue yearly flu shots in the fall. Colonoscopy recommendations reviewed--age 48  Routine eye exam is recommended --vision is a little better than at last year's visit.   CBC, TSH, Vit D, c-met  Counseled extensively re: risks of obesity, diet, weight loss, exercise. Course of NSAIDs for PF and knee pain. F/u with podiatrist if ongoing heel/foot pain.   F/u 3 mos on BP and weight, sooner prn.  Currently has rx for HTN from GYN--since she doesn't plan to f/u with them, she should ask pharmacy to send refill requests here instead, when needed (denies needing soon).

## 2017-03-27 IMAGING — US US MFM OB FOLLOW-UP
1 series · 14 of 28 positions shown · non-contrast
Comparison: none

[Series 1: us mfm ob follow-up · 39 acquisitions, 14 frames shown]
[im 2/39]
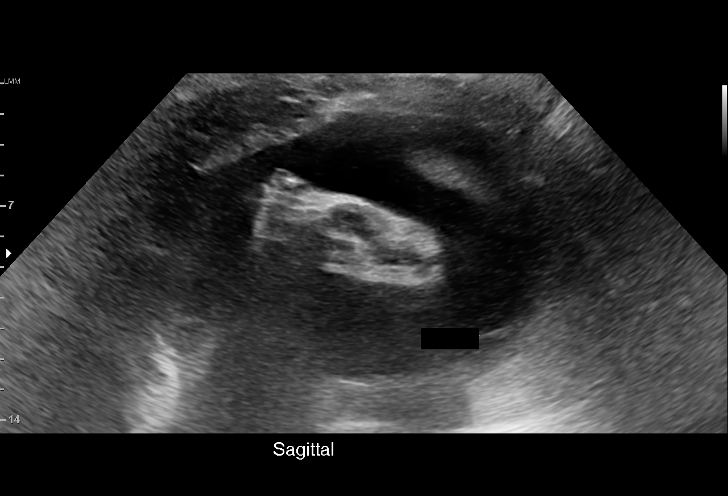
[im 5/39]
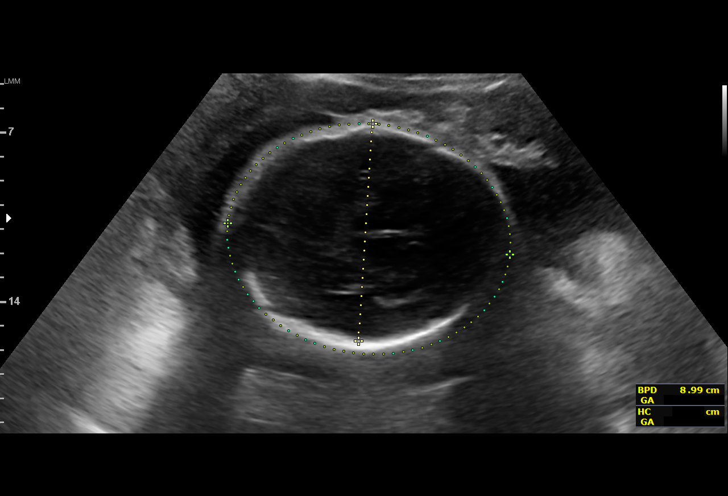
[im 8/39]
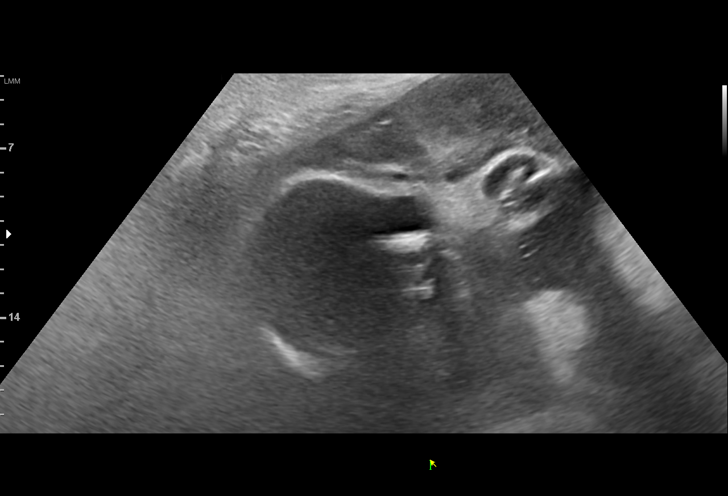
[im 10/39]
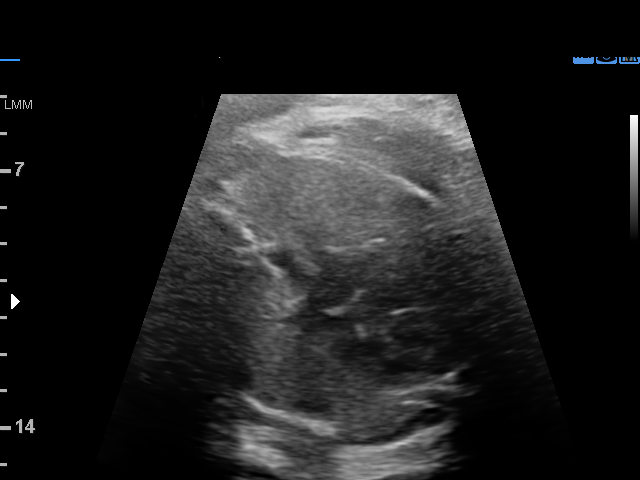
[im 13/39]
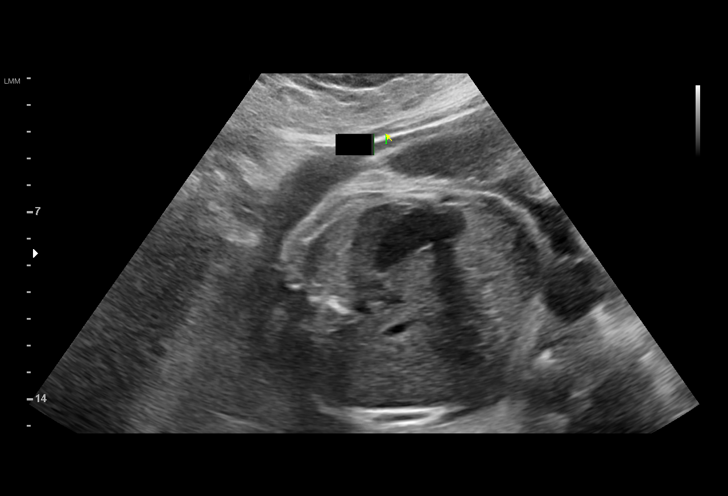
[im 16/39]
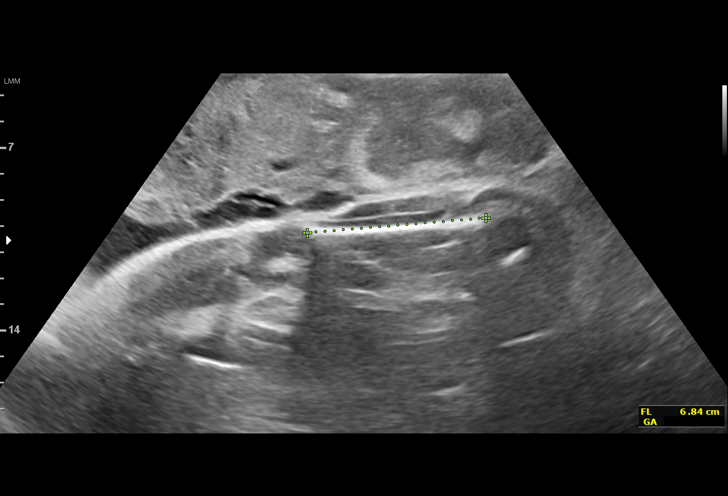
[im 19/39]
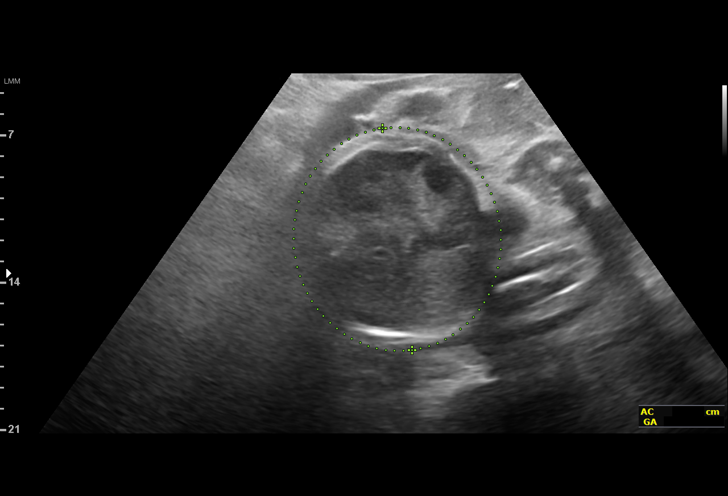
[im 22/39]
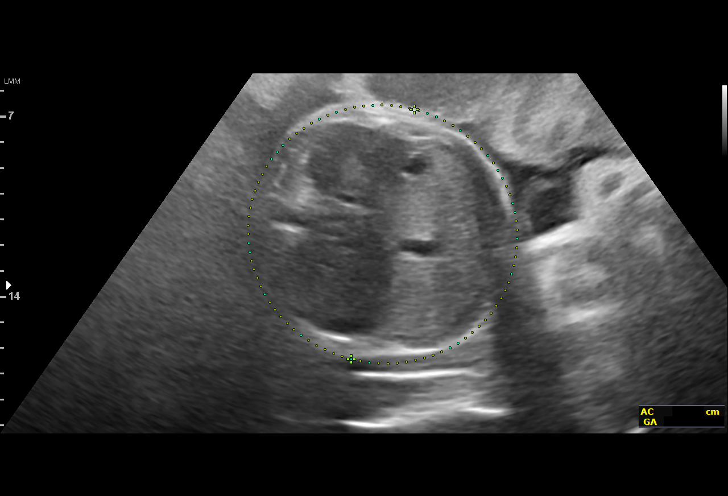
[im 24/39]
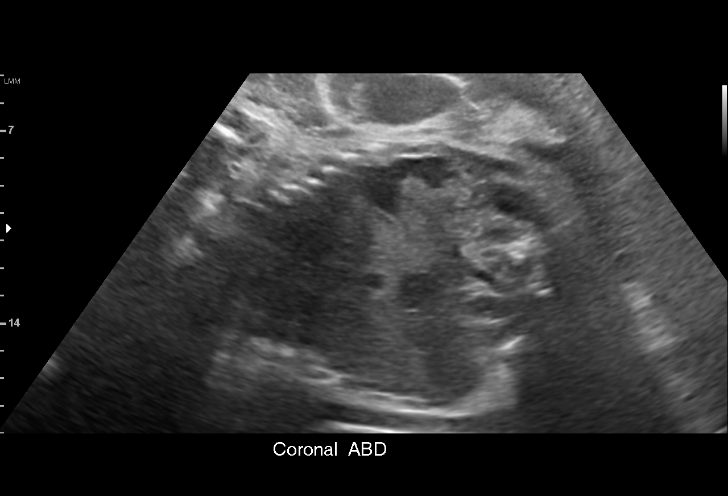
[im 27/39]
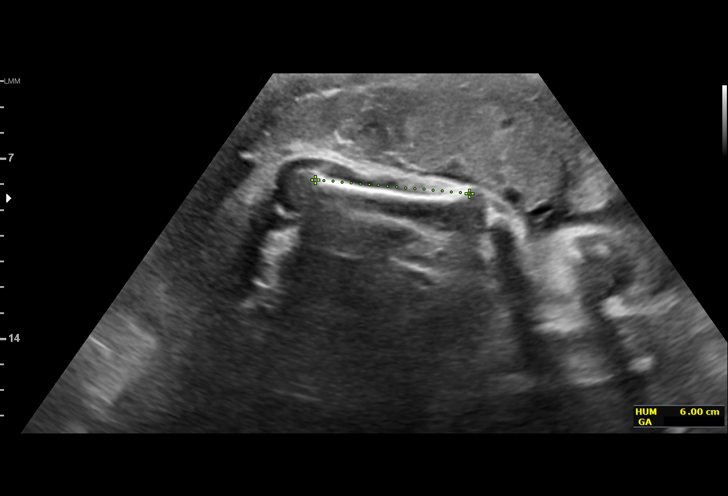
[im 30/39]
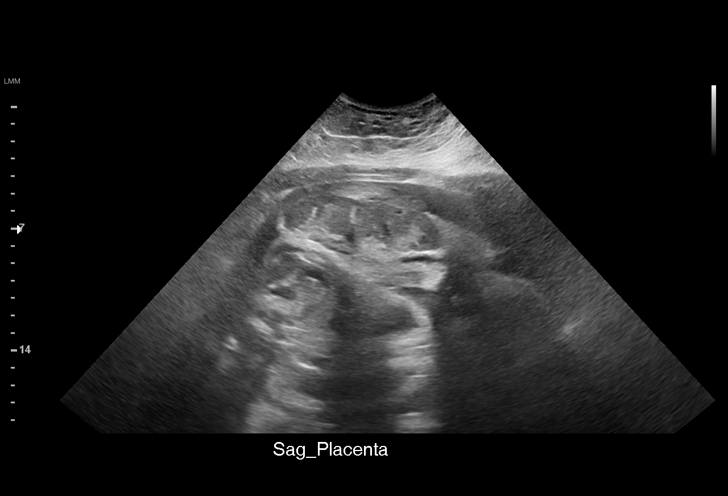
[im 33/39]
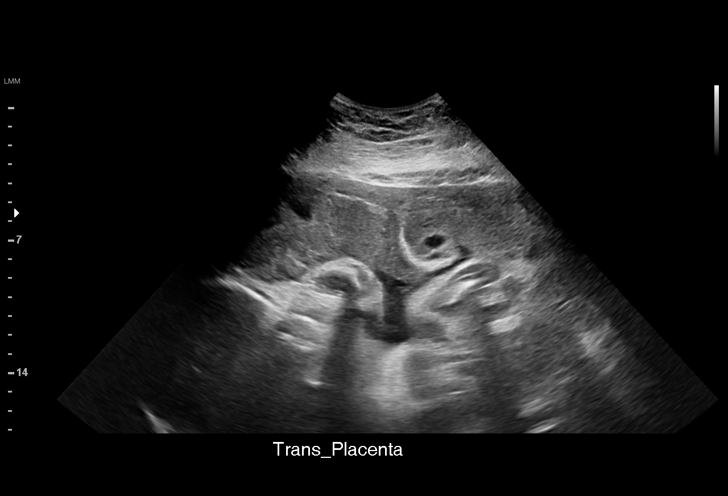
[im 36/39]
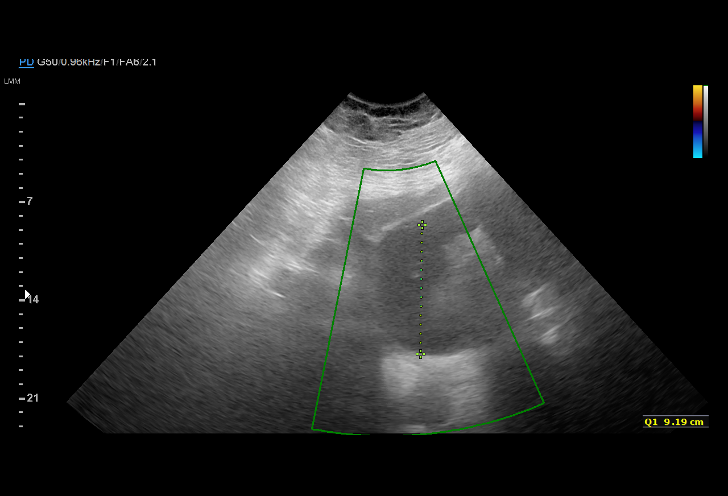
[im 39/39]
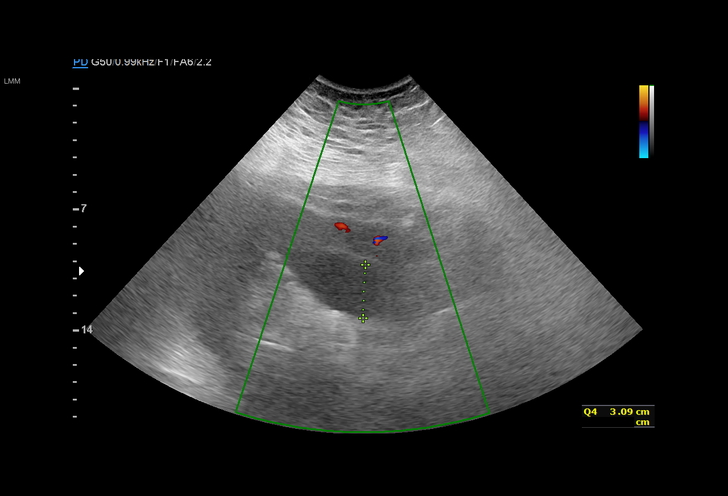

[14 of 28 positions shown; findings below may reference images not displayed]

Road [HOSPITAL]

[address]  [place]            [id number]      [id number]     [id number]
Indications

35 weeks gestation of pregnancy
Abnormal biochemical screen (quad) for
Trisomy 21 (DSR [DATE])
Advanced maternal age primigravida 34,
third trimester
Medical complication of pregnancy (Hgb AC
with alpha thalassemia)
Maternal morbid obesity
OB History

Blood Type:            Height:  5'3"   Weight (lb):  394      BMI:
Gravidity:    1         Term:   0        Prem:   0        SAB:   0
TOP:          0       Ectopic:  0        Living: 0
Fetal Evaluation

Num Of Fetuses:     1
Fetal Heart         138
Rate(bpm):
Cardiac Activity:   Observed
Presentation:       Transverse, head to maternal right
Placenta:           Anterior, above cervical os
P. Cord Insertion:  Previously marginal

Amniotic Fluid
AFI FV:      Subjectively within normal limits

AFI Sum(cm)     %Tile       Largest Pocket(cm)
20.66           78
RUQ(cm)       RLQ(cm)       LUQ(cm)        LLQ(cm)
9.19
Biometry

BPD:      90.1  mm     G. Age:  36w 4d         80  %    CI:        73.47   %   70 - 86
FL/HC:      20.6   %   20.1 -
HC:       334   mm     G. Age:  38w 1d         80  %    HC/AC:      1.05       0.93 -
AC:      317.6  mm     G. Age:  35w 5d         62  %    FL/BPD:     76.5   %   71 - 87
FL:       68.9  mm     G. Age:  35w 2d         39  %    FL/AC:      21.7   %   20 - 24
HUM:      60.5  mm     G. Age:  35w 1d         54  %
Est. FW:    6748  gm      6 lb 3 oz     71  %
Gestational Age

LMP:           34w 1d       Date:   08/21/15                 EDD:   05/27/16
U/S Today:     36w 3d                                        EDD:   05/11/16
Best:          35w 4d    Det. By:   Early Ultrasound         EDD:   05/17/16
(10/31/15)
Anatomy

Cranium:               Appears normal         Aortic Arch:            Previously seen
Cavum:                 Previously seen        Ductal Arch:            Not well visualized
Ventricles:            Previously seen        Diaphragm:              Previously seen
Choroid Plexus:        Previously seen        Stomach:                Appears normal, left
sided
Cerebellum:            Previously seen        Abdomen:                Appears normal
Posterior Fossa:       Previously seen        Abdominal Wall:         Previously seen
Nuchal Fold:           Previously seen        Cord Vessels:           Previously seen
Face:                  Profile previously     Kidneys:                Appear normal
seen
Lips:                  Previously seen        Bladder:                Appears normal
Thoracic:              Appears normal         Spine:                  Appears normal
Heart:                 Not well visualized    Upper Extremities:      Previously seen
RVOT:                  Previously seen        Lower Extremities:      Previously seen
LVOT:                  Previously seen

Other:  Heels and 5th previously digit visualized. Nasal bone previously
visualized. Technically difficult due to maternal habitus and fetal
position.
Impression

Single IUP at 35w 4d
AMA, morbid obesity
Limited views of the fetal heart again obtained
The estimated fetal weight today is at the 71st %tile
Transverse lie with the fetal head to the maternal right
Normal amniotic fluid volume
Recommendations

May consider ECV due to transverse lie
Follow-up ultrasounds as clinically indicated.

## 2017-03-28 ENCOUNTER — Ambulatory Visit (INDEPENDENT_AMBULATORY_CARE_PROVIDER_SITE_OTHER): Payer: Medicaid Other | Admitting: Family Medicine

## 2017-03-28 ENCOUNTER — Encounter: Payer: Self-pay | Admitting: Family Medicine

## 2017-03-28 VITALS — BP 132/82 | HR 85 | Ht 65.0 in | Wt >= 6400 oz

## 2017-03-28 DIAGNOSIS — Z6841 Body Mass Index (BMI) 40.0 and over, adult: Secondary | ICD-10-CM

## 2017-03-28 DIAGNOSIS — R635 Abnormal weight gain: Secondary | ICD-10-CM

## 2017-03-28 DIAGNOSIS — M25562 Pain in left knee: Secondary | ICD-10-CM

## 2017-03-28 DIAGNOSIS — D563 Thalassemia minor: Secondary | ICD-10-CM

## 2017-03-28 DIAGNOSIS — E559 Vitamin D deficiency, unspecified: Secondary | ICD-10-CM

## 2017-03-28 DIAGNOSIS — M722 Plantar fascial fibromatosis: Secondary | ICD-10-CM

## 2017-03-28 DIAGNOSIS — Z0000 Encounter for general adult medical examination without abnormal findings: Secondary | ICD-10-CM

## 2017-03-28 DIAGNOSIS — I10 Essential (primary) hypertension: Secondary | ICD-10-CM | POA: Diagnosis not present

## 2017-03-28 LAB — POCT URINALYSIS DIP (PROADVANTAGE DEVICE)
Bilirubin, UA: NEGATIVE
GLUCOSE UA: NEGATIVE mg/dL
Ketones, POC UA: NEGATIVE mg/dL
Leukocytes, UA: NEGATIVE
NITRITE UA: NEGATIVE
PH UA: 6 (ref 5.0–8.0)
PROTEIN UA: NEGATIVE mg/dL
RBC UA: NEGATIVE
Specific Gravity, Urine: 1.03
UUROB: NEGATIVE

## 2017-03-28 LAB — COMPREHENSIVE METABOLIC PANEL
ALT: 9 U/L (ref 6–29)
AST: 10 U/L (ref 10–30)
Albumin: 3.9 g/dL (ref 3.6–5.1)
Alkaline Phosphatase: 72 U/L (ref 33–115)
BILIRUBIN TOTAL: 0.4 mg/dL (ref 0.2–1.2)
BUN: 8 mg/dL (ref 7–25)
CO2: 25 mmol/L (ref 20–32)
CREATININE: 0.62 mg/dL (ref 0.50–1.10)
Calcium: 9.1 mg/dL (ref 8.6–10.2)
Chloride: 102 mmol/L (ref 98–110)
GLUCOSE: 87 mg/dL (ref 65–99)
Potassium: 4.2 mmol/L (ref 3.5–5.3)
SODIUM: 141 mmol/L (ref 135–146)
Total Protein: 6.8 g/dL (ref 6.1–8.1)

## 2017-03-28 LAB — CBC WITH DIFFERENTIAL/PLATELET
BASOS ABS: 0 {cells}/uL (ref 0–200)
Basophils Relative: 0 %
EOS ABS: 56 {cells}/uL (ref 15–500)
Eosinophils Relative: 1 %
HEMATOCRIT: 36.6 % (ref 35.0–45.0)
Hemoglobin: 12.1 g/dL (ref 11.7–15.5)
LYMPHS PCT: 36 %
Lymphs Abs: 2016 cells/uL (ref 850–3900)
MCH: 23 pg — AB (ref 27.0–33.0)
MCHC: 33.1 g/dL (ref 32.0–36.0)
MCV: 69.6 fL — AB (ref 80.0–100.0)
MONO ABS: 336 {cells}/uL (ref 200–950)
MONOS PCT: 6 %
MPV: 9.8 fL (ref 7.5–12.5)
Neutro Abs: 3192 cells/uL (ref 1500–7800)
Neutrophils Relative %: 57 %
PLATELETS: 311 10*3/uL (ref 140–400)
RBC: 5.26 MIL/uL — ABNORMAL HIGH (ref 3.80–5.10)
RDW: 17.3 % — AB (ref 11.0–15.0)
WBC: 5.6 10*3/uL (ref 4.0–10.5)

## 2017-03-28 LAB — TSH: TSH: 2.27 mIU/L

## 2017-03-28 MED ORDER — NAPROXEN 500 MG PO TABS: 500.0000 mg | ORAL_TABLET | Freq: Two times a day (BID) | ORAL | 0 refills | Status: DC

## 2017-03-28 NOTE — Patient Instructions (Addendum)
HEALTH MAINTENANCE RECOMMENDATIONS:  It is recommended that you get at least 30 minutes of aerobic exercise at least 5 days/week (for weight loss, you may need as much as 60-90 minutes). This can be any activity that gets your heart rate up. This can be divided in 10-15 minute intervals if needed, but try and build up your endurance at least once a week.  Weight bearing exercise is also recommended twice weekly.  Eat a healthy diet with lots of vegetables, fruits and fiber.  "Colorful" foods have a lot of vitamins (ie green vegetables, tomatoes, red peppers, etc).  Limit sweet tea, regular sodas and alcoholic beverages, all of which has a lot of calories and sugar.  Up to 1 alcoholic drink daily may be beneficial for women (unless trying to lose weight, watch sugars).  Drink a lot of water.  Calcium recommendations are 1200-1500 mg daily (1500 mg for postmenopausal women or women without ovaries), and vitamin D 1000 IU daily.  This should be obtained from diet and/or supplements (vitamins), and calcium should not be taken all at once, but in divided doses.  Monthly self breast exams and yearly mammograms for women over the age of 37 is recommended.  Sunscreen of at least SPF 30 should be used on all sun-exposed parts of the skin when outside between the hours of 10 am and 4 pm (not just when at beach or pool, but even with exercise, golf, tennis, and yard work!)  Use a sunscreen that says "broad spectrum" so it covers both UVA and UVB rays, and make sure to reapply every 1-2 hours.  Remember to change the batteries in your smoke detectors when changing your clock times in the spring and fall.  Use your seat belt every time you are in a car, and please drive safely and not be distracted with cell phones and texting while driving.  Please switch to a Diet soda, rather than drinking regular soda.  Consider a flavor change (ie change to Diet Dr. Reino KentPepper or Diet [facility] Hospital[facility] Dew if you need the caffeine, or  Diet Sprite/7-up if caffeine not needed.  Work on changing your sweet tea to 1/2 sweet and 1/2 unsweet, and gradually switching to unsweet and using an artificial sweetener (ie Splenda). Consider just switching entirely to a different drink, if possible.  Cut back on juices as well--I would rather you eat more fruit and drink more water instead of fruit juices.  Routine eye exam is recommended --your vision is actually a little better than at last year's visit.   You may continue to use the neti-pot.  Consider using loratadine (claritin) if needed for allergies, congestion).  Avoid decongestants  ("D" version of meds) as this will raise your blood pressure).   DASH Eating Plan DASH stands for "Dietary Approaches to Stop Hypertension." The DASH eating plan is a healthy eating plan that has been shown to reduce high blood pressure (hypertension). It may also reduce your risk for type 2 diabetes, heart disease, and stroke. The DASH eating plan may also help with weight loss. What are tips for following this plan? General guidelines  Avoid eating more than 2,300 mg (milligrams) of salt (sodium) a day. If you have hypertension, you may need to reduce your sodium intake to 1,500 mg a day.  Limit alcohol intake to no more than 1 drink a day for nonpregnant women and 2 drinks a day for men. One drink equals 12 oz of beer, 5 oz of wine, or 1  oz of hard liquor.  Work with your health care provider to maintain a healthy body weight or to lose weight. Ask what an ideal weight is for you.  Get at least 30 minutes of exercise that causes your heart to beat faster (aerobic exercise) most days of the week. Activities may include walking, swimming, or biking.  Work with your health care provider or diet and nutrition specialist (dietitian) to adjust your eating plan to your individual calorie needs. Reading food labels  Check food labels for the amount of sodium per serving. Choose foods with less than 5  percent of the Daily Value of sodium. Generally, foods with less than 300 mg of sodium per serving fit into this eating plan.  To find whole grains, look for the word "whole" as the first word in the ingredient list. Shopping  Buy products labeled as "low-sodium" or "no salt added."  Buy fresh foods. Avoid canned foods and premade or frozen meals. Cooking  Avoid adding salt when cooking. Use salt-free seasonings or herbs instead of table salt or sea salt. Check with your health care provider or pharmacist before using salt substitutes.  Do not fry foods. Cook foods using healthy methods such as baking, boiling, grilling, and broiling instead.  Cook with heart-healthy oils, such as olive, canola, soybean, or sunflower oil. Meal planning   Eat a balanced diet that includes: ? 5 or more servings of fruits and vegetables each day. At each meal, try to fill half of your plate with fruits and vegetables. ? Up to 6-8 servings of whole grains each day. ? Less than 6 oz of lean meat, poultry, or fish each day. A 3-oz serving of meat is about the same size as a deck of cards. One egg equals 1 oz. ? 2 servings of low-fat dairy each day. ? A serving of nuts, seeds, or beans 5 times each week. ? Heart-healthy fats. Healthy fats called Omega-3 fatty acids are found in foods such as flaxseeds and coldwater fish, like sardines, salmon, and mackerel.  Limit how much you eat of the following: ? Canned or prepackaged foods. ? Food that is high in trans fat, such as fried foods. ? Food that is high in saturated fat, such as fatty meat. ? Sweets, desserts, sugary drinks, and other foods with added sugar. ? Full-fat dairy products.  Do not salt foods before eating.  Try to eat at least 2 vegetarian meals each week.  Eat more home-cooked food and less restaurant, buffet, and fast food.  When eating at a restaurant, ask that your food be prepared with less salt or no salt, if possible. What foods are  recommended? The items listed may not be a complete list. Talk with your dietitian about what dietary choices are best for you. Grains Whole-grain or whole-wheat bread. Whole-grain or whole-wheat pasta. Brown rice. Orpah Cobb. Bulgur. Whole-grain and low-sodium cereals. Pita bread. Low-fat, low-sodium crackers. Whole-wheat flour tortillas. Vegetables Fresh or frozen vegetables (raw, steamed, roasted, or grilled). Low-sodium or reduced-sodium tomato and vegetable juice. Low-sodium or reduced-sodium tomato sauce and tomato paste. Low-sodium or reduced-sodium canned vegetables. Fruits All fresh, dried, or frozen fruit. Canned fruit in natural juice (without added sugar). Meat and other protein foods Skinless chicken or [place]. Ground chicken or [place]. Pork with fat trimmed off. Fish and seafood. Egg whites. Dried beans, peas, or lentils. Unsalted nuts, nut butters, and seeds. Unsalted canned beans. Lean cuts of beef with fat trimmed off. Low-sodium, lean deli meat. Dairy  Low-fat (1%) or fat-free (skim) milk. Fat-free, low-fat, or reduced-fat cheeses. Nonfat, low-sodium ricotta or cottage cheese. Low-fat or nonfat yogurt. Low-fat, low-sodium cheese. Fats and oils Soft margarine without trans fats. Vegetable oil. Low-fat, reduced-fat, or light mayonnaise and salad dressings (reduced-sodium). Canola, safflower, olive, soybean, and sunflower oils. Avocado. Seasoning and other foods Herbs. Spices. Seasoning mixes without salt. Unsalted popcorn and pretzels. Fat-free sweets. What foods are not recommended? The items listed may not be a complete list. Talk with your dietitian about what dietary choices are best for you. Grains Baked goods made with fat, such as croissants, muffins, or some breads. Dry pasta or rice meal packs. Vegetables Creamed or fried vegetables. Vegetables in a cheese sauce. Regular canned vegetables (not low-sodium or reduced-sodium). Regular canned tomato sauce and paste (not  low-sodium or reduced-sodium). Regular tomato and vegetable juice (not low-sodium or reduced-sodium). Rosita Fire. Olives. Fruits Canned fruit in a light or heavy syrup. Fried fruit. Fruit in cream or butter sauce. Meat and other protein foods Fatty cuts of meat. Ribs. Fried meat. Tomasa Blase. Sausage. Bologna and other processed lunch meats. Salami. Fatback. Hotdogs. Bratwurst. Salted nuts and seeds. Canned beans with added salt. Canned or smoked fish. Whole eggs or egg yolks. Chicken or [place] with skin. Dairy Whole or 2% milk, cream, and half-and-half. Whole or full-fat cream cheese. Whole-fat or sweetened yogurt. Full-fat cheese. Nondairy creamers. Whipped toppings. Processed cheese and cheese spreads. Fats and oils Butter. Stick margarine. Lard. Shortening. Ghee. Bacon fat. Tropical oils, such as coconut, palm kernel, or palm oil. Seasoning and other foods Salted popcorn and pretzels. Onion salt, garlic salt, seasoned salt, table salt, and sea salt. Worcestershire sauce. Tartar sauce. Barbecue sauce. Teriyaki sauce. Soy sauce, including reduced-sodium. Steak sauce. Canned and packaged gravies. Fish sauce. Oyster sauce. Cocktail sauce. Horseradish that you find on the shelf. Ketchup. Mustard. Meat flavorings and tenderizers. Bouillon cubes. Hot sauce and Tabasco sauce. Premade or packaged marinades. Premade or packaged taco seasonings. Relishes. Regular salad dressings. Where to find more information:  National Heart, Lung, and Blood Institute: PopSteam.is  American Heart Association: www.heart.org Summary  The DASH eating plan is a healthy eating plan that has been shown to reduce high blood pressure (hypertension). It may also reduce your risk for type 2 diabetes, heart disease, and stroke.  With the DASH eating plan, you should limit salt (sodium) intake to 2,300 mg a day. If you have hypertension, you may need to reduce your sodium intake to 1,500 mg a day.  When on the DASH eating plan,  aim to eat more fresh fruits and vegetables, whole grains, lean proteins, low-fat dairy, and heart-healthy fats.  Work with your health care provider or diet and nutrition specialist (dietitian) to adjust your eating plan to your individual calorie needs. This information is not intended to replace advice given to you by your health care provider. Make sure you discuss any questions you have with your health care provider. Document Released: 07/29/2011 Document Revised: 08/02/2016 Document Reviewed: 08/02/2016 Elsevier Interactive Patient Education  2017 Elsevier Inc.   Plantar Fasciitis Plantar fasciitis is a painful foot condition that affects the heel. It occurs when the band of tissue that connects the toes to the heel bone (plantar fascia) becomes irritated. This can happen after exercising too much or doing other repetitive activities (overuse injury). The pain from plantar fasciitis can range from mild irritation to severe pain that makes it difficult for you to walk or move. The pain is usually worse in the morning  or after you have been sitting or lying down for a while. What are the causes? This condition may be caused by: Standing for long periods of time. Wearing shoes that do not fit. Doing high-impact activities, including running, aerobics, and ballet. Being overweight. Having an abnormal way of walking (gait). Having tight calf muscles. Having high arches in your feet. Starting a new athletic activity.  What are the signs or symptoms? The main symptom of this condition is heel pain. Other symptoms include: Pain that gets worse after activity or exercise. Pain that is worse in the morning or after resting. Pain that goes away after you walk for a few minutes.  How is this diagnosed? This condition may be diagnosed based on your signs and symptoms. Your health care provider will also do a physical exam to check for: A tender area on the bottom of your foot. A high arch in  your foot. Pain when you move your foot. Difficulty moving your foot.  You may also need to have imaging studies to confirm the diagnosis. These can include: X-rays. Ultrasound. MRI.  How is this treated? Treatment for plantar fasciitis depends on the severity of the condition. Your treatment may include: Rest, ice, and over-the-counter pain medicines to manage your pain. Exercises to stretch your calves and your plantar fascia. A splint that holds your foot in a stretched, upward position while you sleep (night splint). Physical therapy to relieve symptoms and prevent problems in the future. Cortisone injections to relieve severe pain. Extracorporeal shock wave therapy (ESWT) to stimulate damaged plantar fascia with electrical impulses. It is often used as a last resort before surgery. Surgery, if other treatments have not worked after 12 months.  Follow these instructions at home: Take medicines only as directed by your health care provider. Avoid activities that cause pain. Roll the bottom of your foot over a bag of ice or a bottle of cold water. Do this for 20 minutes, 3-4 times a day. Perform simple stretches as directed by your health care provider. Try wearing athletic shoes with air-sole or gel-sole cushions or soft shoe inserts. Wear a night splint while sleeping, if directed by your health care provider. Keep all follow-up appointments with your health care provider. How is this prevented? Do not perform exercises or activities that cause heel pain. Consider finding low-impact activities if you continue to have problems. Lose weight if you need to. The best way to prevent plantar fasciitis is to avoid the activities that aggravate your plantar fascia. Contact a health care provider if: Your symptoms do not go away after treatment with home care measures. Your pain gets worse. Your pain affects your ability to move or do your daily activities. This information is not  intended to replace advice given to you by your health care provider. Make sure you discuss any questions you have with your health care provider. Document Released: 05/04/2001 Document Revised: 01/12/2016 Document Reviewed: 06/19/2014 Elsevier Interactive Patient Education  2018 [employer].  Take your prescribed anti-inflammatory medication (naproxen) with food; discontinue or cut back the dose if you develop stomach pain/discomfort/side effects.  Do not take other over-the-counter pain medications such as ibuprofen, advil, motrin, aleve, naproxen, BC or Goody Powder at the same time.  Do not use longer than recommended.  It is okay to use acetaminophen (tylenol) along with this medication.

## 2017-03-29 ENCOUNTER — Encounter: Payer: Self-pay | Admitting: Family Medicine

## 2017-03-29 DIAGNOSIS — I10 Essential (primary) hypertension: Secondary | ICD-10-CM | POA: Insufficient documentation

## 2017-03-29 LAB — VITAMIN D 25 HYDROXY (VIT D DEFICIENCY, FRACTURES): Vit D, 25-Hydroxy: 29 ng/mL — ABNORMAL LOW (ref 30–100)

## 2017-05-05 ENCOUNTER — Other Ambulatory Visit: Payer: Self-pay | Admitting: Family Medicine

## 2017-05-05 DIAGNOSIS — M722 Plantar fascial fibromatosis: Secondary | ICD-10-CM

## 2017-05-05 NOTE — Telephone Encounter (Signed)
Patient put in for this refill- I called her because I saw last note where you had said podiatry consult if not better. She states foot feels better when on the medication but no better when not taking esp after working at night. Please advise.

## 2017-05-05 NOTE — Telephone Encounter (Signed)
This is not intended for long-term, regular use. Okay to refill #30 just once, and remind her if having ongoing issues (meaning recurrent pain when off meds), she should schedule with podiatrist as we discussed. The refill should help until she can get there.

## 2017-06-28 NOTE — Progress Notes (Signed)
Chief Complaint  Patient presents with  . Hypertension    fasting med check. Still having left knee and left foot pain.     Patient presents for 3 month follow-up on hypertension and morbid obesity.  Hypertension:  BP's haven't been checked until today.  She used her sister's wrist monitor and it was 136/87 prior to her visit today.   Compliant with taking amlodipine, denies side effects. Denies headaches, dizziness, chest pain, swelling.  Vitamin D level was just slightly low at 29 at her physical. She had been taking 2000 IU of Vitamin D3 in addition to her PNV, and continues to do so.  Plantar fasciitis left foot--given course of naproxen at her August visit. Arch supports helps some. She had some improvement with the naproxen, but pain never completely resolved.   She also continues to have left knee pain.  Biofreeze helps with both her foot and knee pain.  Left knee pain is worse with rainy weather. Today she is wearing unsupportive slippers (couldn't find shoes, and didn't want to be late for her visit).   Obesity:  She has been eating more salads and baked foods, no more fried foods. She stopped drinking Coke/regular sodas, drinking more water. She also cut out sweet tea and juices.  Only drinking water, milk in her cereal. Chef salads with lowfat [place], fat-free dressing and limited cheese.  When she has baked chicken, she has brown rice and brocolli or green beans or corn on the cob. She eats wheat bread also as a side.  She is walking 30 minutes twice day with her son in the stroller, 2x/week.  (Per last visit: fried foods only 1x/week (chicken or pork chops); has french fries 2x/week.  +regular soda, +sweet tea, juice 2x/day (snapple drinks). +water, 8-10 12 ounce bottles of water/day when at work. Walks 30 minutes after work 3x/week.)  PMH, PSH, SH reviewed  Outpatient Encounter Medications as of 06/29/2017  Medication Sig  . amLODipine (NORVASC) 10 MG tablet Take 1  tablet (10 mg total) daily by mouth.  . cholecalciferol (VITAMIN D) 1000 UNITS tablet Take 2,000 Units at bedtime by mouth.   . Prenatal Vit-Fe Fumarate-FA (PRENATAL VITAMIN PO) Take 1 tablet by mouth daily.  . [DISCONTINUED] amLODipine (NORVASC) 10 MG tablet Take 1 tablet (10 mg total) by mouth daily.  Marland Kitchen acetaminophen (TYLENOL) 500 MG tablet Take 500-1,000 mg by mouth every 6 (six) hours as needed for mild pain, moderate pain or headache.   . hydrochlorothiazide (HYDRODIURIL) 25 MG tablet Take 1 tablet (25 mg total) daily by mouth.  . naproxen (NAPROSYN) 500 MG tablet TAKE 1 TABLET(500 MG) BY MOUTH TWICE DAILY WITH A MEAL (Patient not taking: Reported on 06/29/2017)   No facility-administered encounter medications on file as of 06/29/2017.    (HCTZ started today, not prior to visit)  Allergies  Allergen Reactions  . Penicillins Hives and Other (See Comments)    Has patient had a PCN reaction causing immediate rash, facial/tongue/throat swelling, SOB or lightheadedness with hypotension: No Has patient had a PCN reaction causing severe rash involving mucus membranes or skin necrosis: No Has patient had a PCN reaction that required hospitalization No Has patient had a PCN reaction occurring within the last 10 years: No If all of the above answers are "NO", then may proceed with Cephalosporin use.    ROS:  No fever, chills, URI symptoms, headaches, dizziness, chest pain, shortness of breath, swelling, GI or GU complaints.  +left knee and foot pain  per HPI.   PHYSICAL EXAM:  BP (!) 140/98   Pulse 72   Ht '5\' 5"'  (1.651 m)   Wt (!) 404 lb (183.3 kg)   LMP 06/11/2017 (Exact Date)   Breastfeeding? No   BMI 67.23 kg/m   Wt Readings from Last 3 Encounters:  06/29/17 (!) 404 lb (183.3 kg)  03/28/17 (!) 401 lb 12.8 oz (182.3 kg)  07/14/16 (!) 409 lb 3.2 oz (185.6 kg)    144/96 on repeat by MD  Well-appearing, morbidly obese female, in good spirits HEENT: conjunctiva and sclera are  clear, EOMI.  OP clear, missing some teeth Neck: no lymphadenopathy, thyromegaly or mass Heart: regular rate and rhythm, no murmur Lungs: clear bilaterally Extremities: knee not examined. No edema, 2+ pulses. Tender at left arch, and calcaneous   ASSESSMENT/PLAN:  Essential hypertension - suboptimally controlled on amlodipine. Add HCT 1m. Dietary potassium sources reviewed.  f/u 2-3 wks with list of BP; b-met at f/u. low Na diet reviewed - Plan: hydrochlorothiazide (HYDRODIURIL) 25 MG tablet, amLODipine (NORVASC) 10 MG tablet  Morbid obesity with body mass index of 60.0-69.9 in adult ([facility] - counseled re: risks and treatments. Made good dietary changes, but gained weight. Will consider [employer] check into cost/coverage of meds. Decrease carbs  Plantar fasciitis of left foot - refer to podiatry; supportive shoes/arch support - Plan: Ambulatory referral to Podiatry  Need for influenza vaccination - Plan: Flu Vaccine QUAD 6+ mos PF IM (Fluarix Quad PF)  Left knee pain, unspecified chronicity - not evaluated today, but discussed need for weight loss, and non-weight-bearing exercises (for cardio, without worsening pain).    F/u 3 weeks with BP monitor b-met at visit   Start taking HCTZ 250monce daily.  Take it in the morning.  Be sure to have a diet rich in potassium (ie 1/2 banana daily).  Eat extra if having muscle cramps. Monitor your blood pressure and record.  Bring the list and the monitor to your next visit.  Decrease the starchy vegetables and carbs in your diet.  These should make up 1/4 of your meals. Continue to avoid caloric beverages (good job!). Try and increase your exercise to at least 30 minutes daily (more is better). Feel free to try exercises that are not all weight-bearing (seated exercises).  I encourage you to [facility] You may want to also look into the [facility]  [facility] information sessions, as this may be an option down the line if you can't lose weight with other measures. I highly encourage you to look into Weight Watchers as well. Medications can be helpful, and you should consider these.  Check with your insurance to see if there is a preferred medication.  And also look online (using the drug name, with a .com at the end) to see what coupons are available (copay cards, free trial).  I don't think you can use these coupons with Medicaid--keep that in mind.  Be sure to eat small meals/snacks frequently, rather than large meals or skipping meals.  Belviq (plain or XR), Contrave (this is the one where you need to stop if having surgery; makes pain meds less effective), Qsymia (this is the one with phentermine), Saxenda (this is a daily injection).  Return to discuss these medications when you know which are covered/affordable, so that we can start them.

## 2017-06-29 ENCOUNTER — Encounter: Payer: Self-pay | Admitting: Family Medicine

## 2017-06-29 ENCOUNTER — Ambulatory Visit: Payer: Medicaid Other | Admitting: Family Medicine

## 2017-06-29 VITALS — BP 140/98 | HR 72 | Ht 65.0 in | Wt >= 6400 oz

## 2017-06-29 DIAGNOSIS — I10 Essential (primary) hypertension: Secondary | ICD-10-CM | POA: Diagnosis not present

## 2017-06-29 DIAGNOSIS — M722 Plantar fascial fibromatosis: Secondary | ICD-10-CM | POA: Diagnosis not present

## 2017-06-29 DIAGNOSIS — Z6841 Body Mass Index (BMI) 40.0 and over, adult: Secondary | ICD-10-CM

## 2017-06-29 DIAGNOSIS — Z23 Encounter for immunization: Secondary | ICD-10-CM | POA: Diagnosis not present

## 2017-06-29 DIAGNOSIS — M25562 Pain in left knee: Secondary | ICD-10-CM | POA: Diagnosis not present

## 2017-06-29 MED ORDER — AMLODIPINE BESYLATE 10 MG PO TABS: 10.0000 mg | ORAL_TABLET | Freq: Every day | ORAL | 5 refills | Status: DC

## 2017-06-29 MED ORDER — HYDROCHLOROTHIAZIDE 25 MG PO TABS: 25.0000 mg | ORAL_TABLET | Freq: Every day | ORAL | 0 refills | Status: DC

## 2017-06-29 NOTE — Patient Instructions (Addendum)
Start taking HCTZ 25mg  once daily.  Take it in the morning.  Be sure to have a diet rich in potassium (ie 1/2 banana daily).  Eat extra if having muscle cramps. Monitor your blood pressure and record.  Bring the list and the monitor to your next visit.  Decrease the starchy vegetables and carbs in your diet.  These should make up 1/4 of your meals. Continue to avoid caloric beverages (good job!). Try and increase your exercise to at least 30 minutes daily (more is better). Feel free to try exercises that are not all weight-bearing (seated exercises).  I encourage you to look into the [facility] at [facility] (located at [address]). You may want to also look into the [facility] bariatric information sessions, as this may be an option down the line if you can't lose weight with other measures. I highly encourage you to look into Weight Watchers as well. Medications can be helpful, and you should consider these.  Check with your insurance to see if there is a preferred medication.  And also look online (using the drug name, with a .com at the end) to see what coupons are available (copay cards, free trial).  Be sure to eat small meals/snacks frequently, rather than large meals or skipping meals.  Belviq (plain or XR), Contrave (this is the one where you need to stop if having surgery; makes pain meds less effective), Qsymia (this is the one with phentermine), Saxenda (this is a daily injection).  Return to discuss these medications when you know which are covered/affordable, so that we can start them.   Low-Sodium Eating Plan Sodium, which is an element that makes up salt, helps you maintain a healthy balance of fluids in your body. Too much sodium can increase your blood pressure and cause fluid and waste to be held in your body. Your health care provider or dietitian may recommend following this plan if you have high blood pressure (hypertension),  kidney disease, liver disease, or heart failure. Eating less sodium can help lower your blood pressure, reduce swelling, and protect your heart, liver, and kidneys. What are tips for following this plan? General guidelines  Most people on this plan should limit their sodium intake to 1,500-2,000 mg (milligrams) of sodium each day. Reading food labels  The Nutrition Facts label lists the amount of sodium in one serving of the food. If you eat more than one serving, you must multiply the listed amount of sodium by the number of servings.  Choose foods with less than 140 mg of sodium per serving.  Avoid foods with 300 mg of sodium or more per serving. Shopping  Look for lower-sodium products, often labeled as "low-sodium" or "no salt added."  Always check the sodium content even if foods are labeled as "unsalted" or "no salt added".  Buy fresh foods. ? Avoid canned foods and premade or frozen meals. ? Avoid canned, cured, or processed meats  Buy breads that have less than 80 mg of sodium per slice. Cooking  Eat more home-cooked food and less restaurant, buffet, and fast food.  Avoid adding salt when cooking. Use salt-free seasonings or herbs instead of table salt or sea salt. Check with your health care provider or pharmacist before using salt substitutes.  Cook with plant-based oils, such as canola, sunflower, or olive oil. Meal planning  When eating at a restaurant, ask that your food be prepared with less salt or no salt, if  possible.  Avoid foods that contain MSG (monosodium glutamate). MSG is sometimes added to [place] food, bouillon, and some canned foods. What foods are recommended? The items listed may not be a complete list. Talk with your dietitian about what dietary choices are best for you. Grains Low-sodium cereals, including oats, puffed wheat and rice, and shredded wheat. Low-sodium crackers. Unsalted rice. Unsalted pasta. Low-sodium bread. Whole-grain breads and  whole-grain pasta. Vegetables Fresh or frozen vegetables. "No salt added" canned vegetables. "No salt added" tomato sauce and paste. Low-sodium or reduced-sodium tomato and vegetable juice. Fruits Fresh, frozen, or canned fruit. Fruit juice. Meats and other protein foods Fresh or frozen (no salt added) meat, poultry, seafood, and fish. Low-sodium canned tuna and salmon. Unsalted nuts. Dried peas, beans, and lentils without added salt. Unsalted canned beans. Eggs. Unsalted nut butters. Dairy Milk. Soy milk. Cheese that is naturally low in sodium, such as ricotta cheese, fresh mozzarella, or Swiss cheese Low-sodium or reduced-sodium cheese. Cream cheese. Yogurt. Fats and oils Unsalted butter. Unsalted margarine with no trans fat. Vegetable oils such as canola or olive oils. Seasonings and other foods Fresh and dried herbs and spices. Salt-free seasonings. Low-sodium mustard and ketchup. Sodium-free salad dressing. Sodium-free light mayonnaise. Fresh or refrigerated horseradish. Lemon juice. Vinegar. Homemade, reduced-sodium, or low-sodium soups. Unsalted popcorn and pretzels. Low-salt or salt-free chips. What foods are not recommended? The items listed may not be a complete list. Talk with your dietitian about what dietary choices are best for you. Grains Instant hot cereals. Bread stuffing, pancake, and biscuit mixes. Croutons. Seasoned rice or pasta mixes. Noodle soup cups. Boxed or frozen macaroni and cheese. Regular salted crackers. Self-rising flour. Vegetables Sauerkraut, pickled vegetables, and relishes. Olives. [place] fries. Onion rings. Regular canned vegetables (not low-sodium or reduced-sodium). Regular canned tomato sauce and paste (not low-sodium or reduced-sodium). Regular tomato and vegetable juice (not low-sodium or reduced-sodium). Frozen vegetables in sauces. Meats and other protein foods Meat or fish that is salted, canned, smoked, spiced, or pickled. Bacon, ham, sausage,  hotdogs, corned beef, chipped beef, packaged lunch meats, salt pork, jerky, pickled herring, anchovies, regular canned tuna, sardines, salted nuts. Dairy Processed cheese and cheese spreads. Cheese curds. Blue cheese. Feta cheese. String cheese. Regular cottage cheese. Buttermilk. Canned milk. Fats and oils Salted butter. Regular margarine. Ghee. Bacon fat. Seasonings and other foods Onion salt, garlic salt, seasoned salt, table salt, and sea salt. Canned and packaged gravies. Worcestershire sauce. Tartar sauce. Barbecue sauce. Teriyaki sauce. Soy sauce, including reduced-sodium. Steak sauce. Fish sauce. Oyster sauce. Cocktail sauce. Horseradish that you find on the shelf. Regular ketchup and mustard. Meat flavorings and tenderizers. Bouillon cubes. Hot sauce and Tabasco sauce. Premade or packaged marinades. Premade or packaged taco seasonings. Relishes. Regular salad dressings. Salsa. Potato and tortilla chips. Corn chips and puffs. Salted popcorn and pretzels. Canned or dried soups. Pizza. Frozen entrees and pot pies. Summary  Eating less sodium can help lower your blood pressure, reduce swelling, and protect your heart, liver, and kidneys.  Most people on this plan should limit their sodium intake to 1,500-2,000 mg (milligrams) of sodium each day.  Canned, boxed, and frozen foods are high in sodium. Restaurant foods, fast foods, and pizza are also very high in sodium. You also get sodium by adding salt to food.  Try to cook at home, eat more fresh fruits and vegetables, and eat less fast food, canned, processed, or prepared foods. This information is not intended to replace advice given to you by your health  care provider. Make sure you discuss any questions you have with your health care provider. Document Released: 01/29/2002 Document Revised: 08/02/2016 Document Reviewed: 08/02/2016 Elsevier Interactive Patient Education  2017 [employer].

## 2017-07-17 NOTE — Progress Notes (Signed)
Chief Complaint  Patient presents with  . Hypertension    nonfasting med check. Patient brought in a short list of bp readings.     Patient presents for follow up on hypertension.  HCTZ 73m was added to her amlodpine 2-3 weeks ago, as her blood pressure was above goal on amlodipine alone. She takes it in the morning, and extra voiding is tolerable.  She had some muscle cramps just for the first two days, relieved by a banana.  No muscle cramps since then.  BP's have been running 115-142/79-92, averaging approximately 130-132/84 (using sister's wrist monitor).  Denies headache (just sinus), dizziness, chest pain, shortness of breath, edema.  We had also discussed her weight at her last visit. She was going to look into Weight Watchers.  Hasn't looked into weight watchers yet, wanted to see how she could do on her own.  She has been walking 30 minutes 5 days/week before work.  Eating more vegetables, cut back on bread/carbs.    PMH, PSH, SH reviewed  Outpatient Encounter Medications as of 07/18/2017  Medication Sig  . amLODipine (NORVASC) 10 MG tablet Take 1 tablet (10 mg total) daily by mouth.  . cholecalciferol (VITAMIN D) 1000 UNITS tablet Take 2,000 Units at bedtime by mouth.   . hydrochlorothiazide (HYDRODIURIL) 25 MG tablet Take 1 tablet (25 mg total) by mouth daily.  . Prenatal Vit-Fe Fumarate-FA (PRENATAL VITAMIN PO) Take 1 tablet by mouth daily.  . [DISCONTINUED] hydrochlorothiazide (HYDRODIURIL) 25 MG tablet Take 1 tablet (25 mg total) daily by mouth.  .Marland Kitchenacetaminophen (TYLENOL) 500 MG tablet Take 500-1,000 mg by mouth every 6 (six) hours as needed for mild pain, moderate pain or headache.   . [DISCONTINUED] naproxen (NAPROSYN) 500 MG tablet TAKE 1 TABLET(500 MG) BY MOUTH TWICE DAILY WITH A MEAL (Patient not taking: Reported on 06/29/2017)   No facility-administered encounter medications on file as of 07/18/2017.    Allergies  Allergen Reactions  . Penicillins Hives and Other  (See Comments)    Has patient had a PCN reaction causing immediate rash, facial/tongue/throat swelling, SOB or lightheadedness with hypotension: No Has patient had a PCN reaction causing severe rash involving mucus membranes or skin necrosis: No Has patient had a PCN reaction that required hospitalization No Has patient had a PCN reaction occurring within the last 10 years: No If all of the above answers are "NO", then may proceed with Cephalosporin use.    ROS: no fever, chills, headaches, dizziness, chest pain, shortness of breath, no further muscle cramps, no edema.  No dysuria, URI symptoms or other complaints. See HPI 8# weight loss since last visit a few weeks ago   PHYSICAL EXAM:  BP 140/74 (BP Location: Right Arm, Cuff Size: Large)   Pulse 92   Ht '5\' 5"'  (1.651 m)   Wt (!) 396 lb (179.6 kg)   BMI 65.90 kg/m   134/84 on repeat by MD, RA, large cuff  Wt Readings from Last 3 Encounters:  07/18/17 (!) 396 lb (179.6 kg)  06/29/17 (!) 404 lb (183.3 kg)  03/28/17 (!) 401 lb 12.8 oz (182.3 kg)   Well appearing, pleasant female, in good spirits Neck: no lymphadenopathy, thyromegaly or mass Heart: regular rate and rhythm Lungs: clear bilaterally Extremities: no edema Neuro: alert and oriented, normal gait Psych:  Normal mood, affect, hygiene and grooming    ASSESSMENT/PLAN:  Essential hypertension - improved since adding HCTZ, increased exercise and some weight loss--continue HCTZ and amlodipine - Plan: Basic metabolic panel,  hydrochlorothiazide (HYDRODIURIL) 25 MG tablet  Essential hypertension - Plan: Basic metabolic panel, hydrochlorothiazide (HYDRODIURIL) 25 MG tablet  Morbid obesity with body mass index of 60.0-69.9 in adult ([place]) - 8# weight loss in 2-3 weeks; encouraged continued healthy habits and weight loss  Medication monitoring encounter - Plan: Basic metabolic panel  Check b-met today. Encouraged continued efforts to lose weight.  May need to look into [facility] if  hits plateau.  Will have her f/u in 3 months to help hold her accountable

## 2017-07-18 ENCOUNTER — Encounter: Payer: Self-pay | Admitting: Family Medicine

## 2017-07-18 ENCOUNTER — Ambulatory Visit: Payer: Medicaid Other | Admitting: Family Medicine

## 2017-07-18 VITALS — BP 134/84 | HR 92 | Ht 65.0 in | Wt 396.0 lb

## 2017-07-18 DIAGNOSIS — I10 Essential (primary) hypertension: Secondary | ICD-10-CM

## 2017-07-18 DIAGNOSIS — Z5181 Encounter for therapeutic drug level monitoring: Secondary | ICD-10-CM

## 2017-07-18 DIAGNOSIS — Z6841 Body Mass Index (BMI) 40.0 and over, adult: Secondary | ICD-10-CM | POA: Diagnosis not present

## 2017-07-18 MED ORDER — HYDROCHLOROTHIAZIDE 25 MG PO TABS: 25.0000 mg | ORAL_TABLET | Freq: Every day | ORAL | 0 refills | Status: DC

## 2017-07-19 ENCOUNTER — Encounter: Payer: Self-pay | Admitting: Family Medicine

## 2017-07-19 LAB — BASIC METABOLIC PANEL
BUN: 14 mg/dL (ref 7–25)
CO2: 30 mmol/L (ref 20–32)
Calcium: 9.5 mg/dL (ref 8.6–10.2)
Chloride: 100 mmol/L (ref 98–110)
Creat: 0.71 mg/dL (ref 0.50–1.10)
GLUCOSE: 104 mg/dL — AB (ref 65–99)
POTASSIUM: 4 mmol/L (ref 3.5–5.3)
SODIUM: 140 mmol/L (ref 135–146)

## 2017-08-19 ENCOUNTER — Encounter: Payer: Self-pay | Admitting: Podiatry

## 2017-08-19 ENCOUNTER — Ambulatory Visit: Payer: Medicaid Other | Admitting: Podiatry

## 2017-08-19 ENCOUNTER — Ambulatory Visit (INDEPENDENT_AMBULATORY_CARE_PROVIDER_SITE_OTHER): Payer: Medicaid Other

## 2017-08-19 VITALS — BP 142/84 | HR 82 | Resp 16

## 2017-08-19 DIAGNOSIS — M7750 Other enthesopathy of unspecified foot: Secondary | ICD-10-CM | POA: Diagnosis not present

## 2017-08-19 DIAGNOSIS — M722 Plantar fascial fibromatosis: Secondary | ICD-10-CM

## 2017-08-19 MED ORDER — MELOXICAM 15 MG PO TABS: 15.0000 mg | ORAL_TABLET | Freq: Every day | ORAL | 2 refills | Status: AC

## 2017-08-19 MED ORDER — METHYLPREDNISOLONE 4 MG PO TBPK: ORAL_TABLET | ORAL | 0 refills | Status: DC

## 2017-08-19 NOTE — Progress Notes (Signed)
Subjective:    Patient ID: Marissa Lowe, female    DOB: October 18, 1980, 36 y.o.   MRN: [id number]  HPI [age] ye[age] Beams[age] old female presents the office today for concerns of left medial foot and ankle swelling and pain.  This is been ongoing for some time now and she denies any recent injury or trauma.  Her primary care physician also prescribed naproxen which did not help and she has tried Biofreeze.    She states that she gets pain when she first gets up at the end of the day. She has no pain at rest.  She denies any redness or warmth associated with pain or swelling.  She has no other concerns.   Review of Systems  All other systems reviewed and are negative.  Past Medical History:  Diagnosis Date  . Alpha thalassemia trait   . Arthritis of foot, left    (see imaging 02/2014)  . Morbid obesity with BMI of 70 and over, adult (HCC)   . Seasonal allergies   . Trichomonal vaginitis 10/2015  . Vitamin D deficiency     Past Surgical History:  Procedure Laterality Date  . CESAREAN SECTION N/A 05/14/2016   Procedure: CESAREAN SECTION;  Surgeon: Duane LopeLuther Eure, MD;  [facility];  Service: Obstetrics;  Laterality: N/A;     Current Outpatient Medications:  .  acetaminophen (TYLENOL) 500 MG tablet, Take 500-1,000 mg by mouth every 6 (six) hours as needed for mild pain, moderate pain or headache. , Disp: , Rfl:  .  amLODipine (NORVASC) 10 MG tablet, Take 1 tablet (10 mg total) daily by mouth., Disp: 30 tablet, Rfl: 5 .  cholecalciferol (VITAMIN D) 1000 UNITS tablet, Take 2,000 Units at bedtime by mouth. , Disp: , Rfl:  .  hydrochlorothiazide (HYDRODIURIL) 25 MG tablet, Take 1 tablet (25 mg total) by mouth daily., Disp: 90 tablet, Rfl: 0 .  meloxicam (MOBIC) 15 MG tablet, Take 1 tablet (15 mg total) by mouth daily., Disp: 30 tablet, Rfl: 2 .  methylPREDNISolone (MEDROL DOSEPAK) 4 MG TBPK tablet, Take as directed, Disp: 21 tablet, Rfl: 0  Allergies  Allergen Reactions  . Penicillins Hives  and Other (See Comments)    Has patient had a PCN reaction causing immediate rash, facial/tongue/throat swelling, SOB or lightheadedness with hypotension: No Has patient had a PCN reaction causing severe rash involving mucus membranes or skin necrosis: No Has patient had a PCN reaction that required hospitalization No Has patient had a PCN reaction occurring within the last 10 years: No If all of the above answers are "NO", then may proceed with Cephalosporin use.    Social History   Socioeconomic History  . Marital status: Single    Spouse name: Not on file  . Number of children: Not on file  . Years of education: Not on file  . Highest education level: Not on file  Social Needs  . Financial resource strain: Not on file  . Food insecurity - worry: Not on file  . Food insecurity - inability: Not on file  . Transportation needs - medical: Not on file  . Transportation needs - non-medical: Not on file  Occupational History  . Occupation: works in Pharmacologistlaundry room at [employer]    Employer: [employer]  Tobacco Use  . Smoking status: Former Smoker    Types: Cigarettes    Last attempt to quit: 09/01/2009    Years since quitting: 7.9  . Smokeless tobacco: Never Used  Substance and Sexual Activity  .  Alcohol use: No  . Drug use: No  . Sexual activity: Not Currently    Partners: Male    Birth control/protection: Surgical    Comment: had tubal ligation at time of her c-section  Other Topics Concern  . Not on file  Social History Narrative   Lives with her son (born 04/2016), and her sister.  No tobacco exposure.   She is no longer with the father of the baby (didn't want her to keep it), they had been together for 3 years.   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         Objective:   Physical Exam General: AAO x3, NAD; obese   Dermatological: Skin is warm, dry and supple bilateral. Nails x 10 are well manicured; remaining integument appears unremarkable  at this time. There are no open sores, no preulcerative lesions, no rash or signs of infection present.  Vascular: Dorsalis Pedis artery and Posterior Tibial artery pedal pulses are 2/4 bilateral with immedate capillary fill time. Pedal hair growth present. There is no pain with calf compression, swelling, warmth, erythema.   Neruologic: Grossly intact via light touch bilateral. Protective threshold with Semmes Wienstein monofilament intact to all pedal sites bilateral. Negative tinel sign.   Musculoskeletal: On the medial aspect the foot and ankle there is mild tenderness palpation.  There is mild tenderness in the course of the posterior tibial, flexor tendons however these appear to be intact.  There is discomfort on the posterior tibial tendon along the insertion of the navicular tuberosity.  There is no area pinpoint bony tenderness of pain to vibratory sensation.  Achilles tendon, plantar fascia appear to be intact.  There is mild edema to the medial ankle on the left side.  Equinus is present.  There is a decrease in medial arch upon weightbearing.  Muscular strength 5/5 in all groups tested bilateral.  Gait: Unassisted, Nonantalgic.      Assessment & Plan:  36 year old female with tendinitis, flatfoot left side -Treatment options discussed including all alternatives, risks, and complications -Etiology of symptoms were discussed -X-rays were obtained and reviewed with the patient.  No evidence of acute fracture or stress fracture identified today. -Prescribed a Medrol Dosepak.  Once this is complete she can start meloxicam. -Discussed ice to the area.  Also discussed the change in shoes.  Long-term I think she will benefit from orthotics.  We discussed doing a custom insert.  I would have her follow-up with Raiford Nobleick for molding of this and she agrees with this and like to go and proceed.  Vivi BarrackMatthew R Clover Feehan DPM

## 2017-08-19 NOTE — Patient Instructions (Signed)
Start with the medrol dose pack (steroid) and once finished you can start the mobic (meloxicam).

## 2017-08-22 ENCOUNTER — Ambulatory Visit: Payer: Medicaid Other | Admitting: Orthotics

## 2017-08-22 DIAGNOSIS — M7750 Other enthesopathy of unspecified foot: Secondary | ICD-10-CM

## 2017-08-22 DIAGNOSIS — M79676 Pain in unspecified toe(s): Secondary | ICD-10-CM

## 2017-08-22 NOTE — Progress Notes (Signed)
Patient came into today to be cast for Custom Foot Orthotics. Upon recommendation of Dr. Ardelle AntonWagoner Patient presents with Medial ankle pain Goals are rear fppt stability, offset heel eversion, take pressure off tibialis posterior tendon Plan vendor tp fab device w/ 4* varus RF post, 1/8" bilat heel raise.   Patient is medicaid and will pay $300

## 2017-09-12 ENCOUNTER — Encounter: Payer: Medicaid Other | Admitting: Orthotics

## 2017-09-15 ENCOUNTER — Ambulatory Visit (INDEPENDENT_AMBULATORY_CARE_PROVIDER_SITE_OTHER): Payer: Medicaid Other | Admitting: Orthotics

## 2017-09-15 DIAGNOSIS — M722 Plantar fascial fibromatosis: Secondary | ICD-10-CM

## 2017-09-15 NOTE — Progress Notes (Signed)
Patient came in today to pick up custom made foot orthotics.  The goals were accomplished and the patient reported no dissatisfaction with said orthotics.  Patient was advised of breakin period and how to report any issues. 

## 2017-10-19 ENCOUNTER — Ambulatory Visit: Payer: Self-pay | Admitting: Medical

## 2017-10-19 ENCOUNTER — Ambulatory Visit (INDEPENDENT_AMBULATORY_CARE_PROVIDER_SITE_OTHER): Payer: Self-pay | Admitting: Family Medicine

## 2017-10-19 ENCOUNTER — Encounter: Payer: Self-pay | Admitting: Family Medicine

## 2017-10-19 VITALS — BP 140/84 | HR 80 | Temp 99.1°F | Ht 65.0 in | Wt 391.0 lb

## 2017-10-19 DIAGNOSIS — Z6841 Body Mass Index (BMI) 40.0 and over, adult: Secondary | ICD-10-CM

## 2017-10-19 DIAGNOSIS — I10 Essential (primary) hypertension: Secondary | ICD-10-CM

## 2017-10-19 DIAGNOSIS — J069 Acute upper respiratory infection, unspecified: Secondary | ICD-10-CM

## 2017-10-19 NOTE — Patient Instructions (Signed)
  Drink plenty of water. Use coricidin HBP to help with congestion. If that doesn't contain guafenesin, then use guaifenesin (mucinex, plain) to help loosen up the mucus. I recommend trying sinus rinses or neti-pot to help with the sinus pain/congestion. Use tylenol as needed for pain or fever. Return or call if symptoms persist/worsen after 7 days.  Your blood pressure is good. Bring your sister's monitor with you, if possible, to your next visit so we can verify the accuracy. Good job on the slow loss of weight--keep up the good work.  Slow and steady wins the race!

## 2017-10-19 NOTE — Progress Notes (Signed)
Chief Complaint  Patient presents with  . Hypertension    3 month follow up on HTN.   . Nasal Congestion    and drainage and her chest hurts when she coughs-started 2 days ago and worsens at night. She works at night and wants to know if you would take her out of work for the next two nighs so she can rest and get well.    Patient presents for 3 month follow up, but also with 2-3 days of sinus problems.  She is having runny nose, stuffy nose, sinus pain, sore throat. No ear pain. +cough, and chest pain with coughing.  Nasal mucus is yellow, mainly in the mornings, a little clearer later in the day.  Cough is mostly dry, rarely gets up yellow phlegm, thick.  She uses Nyquil at night, no daytime medications.  Doesn't seem to help too much.  Denies fevers, body aches (just her sinus/headache and back/chest from coughing).  +sick contacts at work (flu and colds).  HTN:  She continues on HCTZ and amlodipine without side effects.  BP's have been running 132-137/79-82.  No headaches (just sinus currently) or dizziness.   Obesity: She cut back portions, eating more protein, fiber, cut back on carbs (rice, potatoes), eating more vegetables, drinking more water.  She increased her walking from 30 to 45 minutes 5 days/week. Uses orthotics for heel spurs, and hasn't been having any pain.  5# weight loss since last visit 3 mos ago  PMH, PSH, SH reviewed  Outpatient Encounter Medications as of 10/19/2017  Medication Sig Note  . amLODipine (NORVASC) 10 MG tablet Take 1 tablet (10 mg total) daily by mouth.   . cholecalciferol (VITAMIN D) 1000 UNITS tablet Take 2,000 Units at bedtime by mouth.    . hydrochlorothiazide (HYDRODIURIL) 25 MG tablet Take 1 tablet (25 mg total) by mouth daily.   . Pseudoeph-Doxylamine-DM-APAP (NYQUIL MULTI-SYMPTOM PO) Take 2 each by mouth at bedtime. 10/19/2017: Last dose 10pm last night  . acetaminophen (TYLENOL) 500 MG tablet Take 500-1,000 mg by mouth every 6 (six) hours as  needed for mild pain, moderate pain or headache.    . meloxicam (MOBIC) 15 MG tablet Take 1 tablet (15 mg total) by mouth daily. (Patient not taking: Reported on 10/19/2017)   . [DISCONTINUED] methylPREDNISolone (MEDROL DOSEPAK) 4 MG TBPK tablet Take as directed    No facility-administered encounter medications on file as of 10/19/2017.    Allergies  Allergen Reactions  . Penicillins Hives and Other (See Comments)    Has patient had a PCN reaction causing immediate rash, facial/tongue/throat swelling, SOB or lightheadedness with hypotension: No Has patient had a PCN reaction causing severe rash involving mucus membranes or skin necrosis: No Has patient had a PCN reaction that required hospitalization No Has patient had a PCN reaction occurring within the last 10 years: No If all of the above answers are "NO", then may proceed with Cephalosporin use.   ROS: No known fever/chills. URI symptoms per HPI No nausea, vomiting, diarrhea, skin rashes. No chest pain (other than with coughing), palpitations, shortness of breath.  PHYSICAL EXAM:  BP (!) 148/98   Pulse 80   Temp 99.1 F (37.3 C) (Tympanic)   Ht 5\' 5"  (1.651 m)   Wt (!) 391 lb (177.4 kg)   LMP 10/19/2017 (Exact Date)   Breastfeeding? No   BMI 65.07 kg/m    140/84 on repeat by MD, with thigh cuff.  Wt Readings from Last 3 Encounters:  10/19/17 Marland Kitchen(!)  391 lb (177.4 kg)  07/18/17 (!) 396 lb (179.6 kg)  06/29/17 (!) 404 lb (183.3 kg)   Well appearing, pleasant, morbidly obese female, with frequent sniffling, in no distress. HEENT: PERRL, EOMi, conjunctiva and sclera are clear.  TM's and EAC's normal. Mod edema of nasal mucosa, with clear mucus.  Sinuses tender x 4. OP is clear, no erythema, moist mucus membranes Neck: No lymphadenopathy or mass Heart: regular rate and rhythm, no murmur Lungs: clear bilaterally, no wheezes, rales, ronchi Skin: normal turgor, no rash Psych: normal mood, affect, hygiene and grooming Neuro: alert  and oriented, cranial nerves intact, normal gait  ASSESSMENT/PLAN:  Essential hypertension - controlled per home numbers, a little higher here. Verify accuracy of monitor at next visit. Cont current meds, exercise, weight loss  Viral upper respiratory tract infection - supportive measures reviewed, and s/sx bacterial infection for which she should call/return  Morbid obesity with body mass index of 60.0-69.9 in adult [facility]) - gradually losing some weight with healthy diet, portions, exercise. continue   F/u 6 months at CPE, sooner prn. Asked to bring sister's monitor, if possible.    Drink plenty of water. Use coricidin HBP to help with congestion. If that doesn't contain guafenesin, then use guaifenesin (mucinex, plain) to help loosen up the mucus. I recommend trying sinus rinses or neti-pot to help with the sinus pain/congestion. Use tylenol as needed for pain or fever. Return or call if symptoms persist/worsen after 7 days.  Your blood pressure is good. Bring your sister's monitor with you, if possible, to your next visit so we can verify the accuracy. Good job on the slow loss of weight--keep up the good work.  Slow and steady wins the race!

## 2017-11-24 ENCOUNTER — Other Ambulatory Visit: Payer: Self-pay | Admitting: Podiatry

## 2018-04-19 ENCOUNTER — Encounter: Payer: Medicaid Other | Admitting: Family Medicine

## 2018-05-28 ENCOUNTER — Ambulatory Visit (HOSPITAL_COMMUNITY)
Admission: EM | Admit: 2018-05-28 | Discharge: 2018-05-28 | Disposition: A | Discharge status: Home / Self Care | Payer: Self-pay | Attending: Family Medicine | Admitting: Family Medicine

## 2018-05-28 ENCOUNTER — Encounter (HOSPITAL_COMMUNITY): Payer: Self-pay | Admitting: Emergency Medicine

## 2018-05-28 DIAGNOSIS — M25511 Pain in right shoulder: Secondary | ICD-10-CM

## 2018-05-28 DIAGNOSIS — M436 Torticollis: Secondary | ICD-10-CM

## 2018-05-28 HISTORY — DX: Essential (primary) hypertension: I10

## 2018-05-28 MED ORDER — KETOROLAC TROMETHAMINE 30 MG/ML IJ SOLN
INTRAMUSCULAR | Status: AC
  Filled 2018-05-28: qty 1

## 2018-05-28 MED ORDER — PREDNISONE 20 MG PO TABS: ORAL_TABLET | ORAL | 0 refills | Status: DC

## 2018-05-28 MED ORDER — KETOROLAC TROMETHAMINE 30 MG/ML IJ SOLN
30.0000 mg | Freq: Once | INTRAMUSCULAR | Status: AC
  Administered 2018-05-28: 30 mg via INTRAMUSCULAR

## 2018-05-28 MED ORDER — CYCLOBENZAPRINE HCL 10 MG PO TABS: 10.0000 mg | ORAL_TABLET | Freq: Two times a day (BID) | ORAL | 0 refills | Status: DC | PRN

## 2018-05-28 NOTE — ED Triage Notes (Signed)
Pt sts right shoulder pain x 1 month

## 2018-05-28 NOTE — Discharge Instructions (Addendum)
If symptoms have not significantly improve within the next 3 to 4 days schedule a follow-up visit with [facility] orthopedics for further evaluation.  I have placed you on a prednisone taper take as follows: Take Prednisone 20 mg,  in mornings with breakfast as follows:  Take 3 pills for 3 days, Take 2 pills for 3 days, and Take 1 pill for 3 days.  Complete all medication.   For pain I have placed you on cyclobenzaprine 10 mg twice daily as needed.  Caution when taking medication it causes profound drowsiness.  For mild to pain you may take Tylenol 650 mg every 6 hours as needed.

## 2018-05-28 NOTE — ED Provider Notes (Signed)
[facility]    CSN: [id number] Arrival date & time: 05/28/18  1046     History   Chief Complaint Chief Complaint  Patient presents with  . Shoulder Pain    HPI Marissa Lowe is a 37 y.o. female.   HPI   Patient reports over 1 month ago experiencing shoulder and neck pain localized to the right side. 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 Neck and shoulder pain has gradually worsened and now experiences tenderness with touching her right shoulder.She denies numbness or tingling of right extremity. Complains of limited range of motion with right shoulder. Denies difficulty with rotating neck although feels a pulling sensation in right shoulder when turning toward her left side.  He has occasionally taken ibuprofen with only temporary relief.  She has achieved some relief with applications of topical analgesics.  Past Medical History:  Diagnosis Date  . Alpha thalassemia trait   . Arthritis of foot, left    (see imaging 02/2014)  . Hypertension   . Morbid obesity with BMI of 70 and over, adult (HCC)   . Seasonal allergies   . Trichomonal vaginitis 10/2015  . Vitamin D deficiency     Patient Active Problem List   Diagnosis Date Noted  . Essential hypertension 03/29/2017  . Alpha thalassemia trait 03/26/2017  . Wound dehiscence, cesarean 05/25/2016  . S/P cesarean section 05/14/2016  . BMI 70 and over, adult (HCC) 05/09/2014  . Vitamin D deficiency 03/02/2013    Past Surgical History:  Procedure Laterality Date  . CESAREAN SECTION N/A 05/14/2016   Procedure: CESAREAN SECTION;  Surgeon: Duane Lope, MD;  Location: [facility] SUIT[facility];  Service: Obstetrics;  Laterality: N/A;    OB History    Gravida  1   Para  1   Term  1   Preterm  0   AB  0   Living  1     SAB  0   TAB  0   Ectopic  0   Multiple  0   Live Births  1            Home Medications    Prior  to Admission medications   Medication Sig Start Date End Date Taking? Authorizing Provider  acetaminophen (TYLENOL) 500 MG tablet Take 500-1,000 mg by mouth every 6 (six) hours as needed for mild pain, moderate pain or headache.     [provider]  amLODipine (NORVASC) 10 MG tablet Take 1 tablet (10 mg total) daily by mouth. 06/29/17   Joselyn Arrow, MD  cholecalciferol (VITAMIN D) 1000 UNITS tablet Take 2,000 Units at bedtime by mouth.     [provider]  hydrochlorothiazide (HYDRODIURIL) 25 MG tablet Take 1 tablet (25 mg total) by mouth daily. 07/18/17   Joselyn Arrow, MD  meloxicam (MOBIC) 15 MG tablet Take 1 tablet (15 mg total) by mouth daily. Patient not taking: Reported on 10/19/2017 08/19/17 08/19/18  Vivi Barrack, DPM  Pseudoeph-Doxylamine-DM-APAP (NYQUIL MULTI-SYMPTOM PO) Take 2 each by mouth at bedtime.    [provider]    Family History Family History  Problem Relation Age of Onset  . Hypertension Sister   . Hypertension Brother   . Diabetes Mother   . Heart failure Father   . Hypertension Father   . Glaucoma Brother   . Cancer Neg Hx   . Stroke Neg Hx     Social  History Social History   Tobacco Use  . Smoking status: Former Smoker    Types: Cigarettes    Last attempt to quit: 09/01/2009    Years since quitting: 8.7  . Smokeless tobacco: Never Used  Substance Use Topics  . Alcohol use: No  . Drug use: No     Allergies   Penicillins   Review of Systems Review of Systems   Physical Exam Triage Vital Signs ED Triage Vitals  Enc Vitals Group     BP 05/28/18 1131 (!) 161/95     Pulse Rate 05/28/18 1131 78     Resp 05/28/18 1131 18     Temp 05/28/18 1131 97.7 F (36.5 C)     Temp Source 05/28/18 1131 Oral     SpO2 05/28/18 1131 100 %     Weight --      Height --      Head Circumference --      Peak Flow --      Pain Score 05/28/18 1132 6     Pain Loc --      Pain Edu? --      Excl. in GC? --    No data  found.  Updated Vital Signs BP (!) 161/95 (BP Location: Left Arm)   Pulse 78   Temp 97.7 F (36.5 C) (Oral)   Resp 18   SpO2 100%   Visual Acuity Right Eye Distance:   Left Eye Distance:   Bilateral Distance:    Right Eye Near:   Left Eye Near:    Bilateral Near:     Physical Exam   UC Treatments / Results  Labs (all labs ordered are listed, but only abnormal results are displayed) Labs Reviewed - No data to display  EKG None  Radiology No results found.  Procedures Procedures (including critical care time)  Medications Ordered in UC Medications - No data to display  Initial Impression / Assessment and Plan / UC Course  I have reviewed the triage vital signs and the nursing notes.  Pertinent labs & imaging results that were available during my care of the patient were reviewed by me and considered in my medical decision making (see chart for details).    Shoulder strain vs shoulder impingement. No known injury. Will trial prednisone, if no improvement, patient instructed to follow-up with [facility] as this could be related to an impingement syndrome. Cyclobenzaprine prescribed for moderate -severe pain relief. Precaution given advising of the sedative effects of medication.  Verbalized agreement and understanding of plan.  Final Clinical Impressions(s) / UC Diagnoses   Final diagnoses:  Acute pain of right shoulder  Neck stiffness     Discharge Instructions     If symptoms have not significantly improve within the next 3 to 4 days schedule a follow-up visit with [facility] orthopedics for further evaluation.  I have placed you on a prednisone taper take as follows: Take Prednisone 20 mg,  in mornings with breakfast as follows:  Take 3 pills for 3 days, Take 2 pills for 3 days, and Take 1 pill for 3 days.  Complete all medication.   For pain I have placed you on cyclobenzaprine 10 mg twice daily as needed.  Caution when taking medication it  causes profound drowsiness.  For mild to pain you may take Tylenol 650 mg every 6 hours as needed.       ED Prescriptions    Medication Sig Dispense Auth. Provider   predniSONE (DELTASONE) 20  MG tablet Take 3 PO QAM x3days, 2 PO QAM x3days, 1 PO QAM x3days 18 tablet Bing Neighbors, FNP   cyclobenzaprine (FLEXERIL) 10 MG tablet Take 1 tablet (10 mg total) by mouth 2 (two) times daily as needed for muscle spasms. 20 tablet Bing Neighbors, FNP     Controlled Substance Prescriptions [place] Controlled Substance Registry consulted? Not Applicable   Bing Neighbors, FNP 05/30/18 2251

## 2018-06-17 ENCOUNTER — Other Ambulatory Visit: Payer: Self-pay | Admitting: Family Medicine

## 2018-06-19 NOTE — Telephone Encounter (Signed)
Requested medication (s) are due for refill today: yes  Requested medication (s) are on the active medication list: yes  Last refill:  05/28/18 #20  Future visit scheduled: no  Notes to clinic:  NT unable to fill routing back to provider   Requested Prescriptions  Pending Prescriptions Disp Refills   cyclobenzaprine (FLEXERIL) 10 MG tablet [Pharmacy Med Name: CYCLOBENZAPRINE 10MG  TABLETS] 20 tablet 0    Sig: TAKE 1 TABLET(10 MG) BY MOUTH TWICE DAILY AS NEEDED FOR MUSCLE SPASMS     There is no refill protocol information for this order

## 2018-08-05 ENCOUNTER — Encounter (HOSPITAL_COMMUNITY): Payer: Self-pay | Admitting: Emergency Medicine

## 2018-08-05 ENCOUNTER — Ambulatory Visit (HOSPITAL_COMMUNITY)
Admission: EM | Admit: 2018-08-05 | Discharge: 2018-08-05 | Disposition: A | Discharge status: Home / Self Care | Payer: Self-pay | Attending: Physician Assistant | Admitting: Physician Assistant

## 2018-08-05 DIAGNOSIS — L301 Dyshidrosis [pompholyx]: Secondary | ICD-10-CM | POA: Insufficient documentation

## 2018-08-05 MED ORDER — TRIAMCINOLONE ACETONIDE 0.025 % EX OINT: 1.0000 "application " | TOPICAL_OINTMENT | Freq: Two times a day (BID) | CUTANEOUS | 0 refills | Status: DC

## 2018-08-05 MED ORDER — PREDNISONE 50 MG PO TABS: 50.0000 mg | ORAL_TABLET | Freq: Every day | ORAL | 0 refills | Status: DC

## 2018-08-05 NOTE — ED Provider Notes (Signed)
[facility]    CSN: [id number] Arrival date & time: 08/05/18  1028     History   Chief Complaint Chief Complaint  Patient presents with  . Rash    HPI Marissa Lowe is a 37 y.o. female.   37 year old female comes in for 2 week history of rash to bilateral hands. States first started to the right finger, which was itching in nature, and now to the flexor surface of bilateral wrist, and bilateral finger wedges. There are open wounds from scratching. No erythema, warmth. No pain. No fevers. Applied otc hydrocortisone cream without relief. 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 No history of eczema.      Past Medical History:  Diagnosis Date  . Alpha thalassemia trait   . Arthritis of foot, left    (see imaging 02/2014)  . Hypertension   . Morbid obesity with BMI of 70 and over, adult (HCC)   . Seasonal allergies   . Trichomonal vaginitis 10/2015  . Vitamin D deficiency     Patient Active Problem List   Diagnosis Date Noted  . Essential hypertension 03/29/2017  . Alpha thalassemia trait 03/26/2017  . Wound dehiscence, cesarean 05/25/2016  . S/P cesarean section 05/14/2016  . BMI 70 and over, adult (HCC) 05/09/2014  . Vitamin D deficiency 03/02/2013    Past Surgical History:  Procedure Laterality Date  . CESAREAN SECTION N/A 05/14/2016   Procedure: CESAREAN SECTION;  Surgeon: Duane Lope, MD;  Location: [facility]TH[facility] SU[facility];  Service: Obstetrics;  Laterality: N/A;    OB History    Gravida  1   Para  1   Term  1   Preterm  0   AB  0   Living  1     SAB  0   TAB  0   Ectopic  0   Multiple  0   Live Births  1            Home Medications    Prior to Admission medications   Medication Sig Start Date End Date Taking? Authorizing Provider  acetaminophen (TYLENOL) 500 MG tablet Take 500-1,000 mg by mouth every 6 (six) hours as needed for mild pain, moderate pain or headache.     [provider]  amLODipine (NORVASC)  10 MG tablet Take 1 tablet (10 mg total) daily by mouth. Patient not taking: Reported on 08/05/2018 06/29/17   Joselyn Arrow, MD  cholecalciferol (VITAMIN D) 1000 UNITS tablet Take 2,000 Units at bedtime by mouth.     [provider]  cyclobenzaprine (FLEXERIL) 10 MG tablet Take 1 tablet (10 mg total) by mouth 2 (two) times daily as needed for muscle spasms. Patient not taking: Reported on 08/05/2018 05/28/18   Bing Neighbors, FNP  hydrochlorothiazide (HYDRODIURIL) 25 MG tablet Take 1 tablet (25 mg total) by mouth daily. Patient not taking: Reported on 08/05/2018 07/18/17   Joselyn Arrow, MD  meloxicam (MOBIC) 15 MG tablet Take 1 tablet (15 mg total) by mouth daily. Patient not taking: Reported on 10/19/2017 08/19/17 08/19/18  Vivi Barrack, DPM  predniSONE (DELTASONE) 50 MG tablet Take 1 tablet (50 mg total) by mouth daily. 08/05/18   Annaleah Arata V, PA-C  Pseudoeph-Doxylamine-DM-APAP (NYQUIL MULTI-SYMPTOM PO) Take 2 each by mouth at bedtime.    [provider]  triamcinolone (KENALOG) 0.025 % ointment Apply 1 application topically 2 (two) times daily. 08/05/18   Belinda Fisher PA-C    Family History Family  History  Problem Relation Age of Onset  . Hypertension Sister   . Hypertension Brother   . Diabetes Mother   . Heart failure Father   . Hypertension Father   . Glaucoma Brother   . Cancer Neg Hx   . Stroke Neg Hx     Social History Social History   Tobacco Use  . Smoking status: Former Smoker    Types: Cigarettes    Last attempt to quit: 09/01/2009    Years since quitting: 8.9  . Smokeless tobacco: Never Used  Substance Use Topics  . Alcohol use: No  . Drug use: No     Allergies   Penicillins   Review of Systems Review of Systems  Reason unable to perform ROS: See HPI as above.     Physical Exam Triage Vital Signs ED Triage Vitals [08/05/18 1116]  Enc Vitals Group     BP (!) 188/107     Pulse Rate 82     Resp 16     Temp 98.1 F (36.7 C)      Temp src      SpO2 96 %     Weight      Height      Head Circumference      Peak Flow      Pain Score 7     Pain Loc      Pain Edu?      Excl. in GC?    No data found.  Updated Vital Signs BP (!) 188/107   Pulse 82   Temp 98.1 F (36.7 C)   Resp 16   LMP 07/20/2018   SpO2 96%   Physical Exam Constitutional:      General: She is not in acute distress.    Appearance: She is well-developed. She is not diaphoretic.  HENT:     Head: Normocephalic and atraumatic.  Eyes:     Conjunctiva/sclera: Conjunctivae normal.     Pupils: Pupils are equal, round, and reactive to light.  Skin:    General: Skin is warm.     Comments: See picture below. No tenderness to palpation.   Neurological:     Mental Status: She is alert and oriented to person, place, and time.              UC Treatments / Results  Labs (all labs ordered are listed, but only abnormal results are displayed) Labs Reviewed - No data to display  EKG None  Radiology No results found.  Procedures Procedures (including critical care time)  Medications Ordered in UC Medications - No data to display  Initial Impression / Assessment and Plan / UC Course  I have reviewed the triage vital signs and the nursing notes.  Pertinent labs & imaging results that were available during my care of the patient were reviewed by me and considered in my medical decision making (see chart for details).    Start prednisone as directed. Triamcinolone ointment as directed. Information of irritants to avoid provided. Return precautions given.  Final Clinical Impressions(s) / UC Diagnoses   Final diagnoses:  Dyshidrotic eczema   ED Prescriptions    Medication Sig Dispense Auth. Provider   triamcinolone (KENALOG) 0.025 % ointment Apply 1 application topically 2 (two) times daily. 30 g Macklin Jacquin V, PA-C   predniSONE (DELTASONE) 50 MG tablet Take 1 tablet (50 mg total) by mouth daily. 5 tablet Dorena CookeyYu, Matti Killingsworth V, PA-C          Burlin Mcnair, Virginiamy  V, PA-C 08/05/18 1222

## 2018-08-05 NOTE — Discharge Instructions (Signed)
Take prednisone as directed. Use triamcinolone ointment at night time. Wear gloves with it.

## 2018-08-05 NOTE — ED Triage Notes (Signed)
Pt c/o rash on bilateral hands x2 weeks

## 2018-10-30 ENCOUNTER — Encounter: Payer: Self-pay | Admitting: Family Medicine

## 2018-10-30 ENCOUNTER — Ambulatory Visit (INDEPENDENT_AMBULATORY_CARE_PROVIDER_SITE_OTHER): Payer: BLUE CROSS/BLUE SHIELD | Admitting: Family Medicine

## 2018-10-30 VITALS — BP 130/88 | HR 78 | Temp 98.2°F | Resp 16 | Wt >= 6400 oz

## 2018-10-30 DIAGNOSIS — L301 Dyshidrosis [pompholyx]: Secondary | ICD-10-CM | POA: Diagnosis not present

## 2018-10-30 DIAGNOSIS — K439 Ventral hernia without obstruction or gangrene: Secondary | ICD-10-CM

## 2018-10-30 MED ORDER — TRIAMCINOLONE ACETONIDE 0.5 % EX CREA: 1.0000 "application " | TOPICAL_CREAM | Freq: Three times a day (TID) | CUTANEOUS | 0 refills | Status: DC

## 2018-10-30 NOTE — Progress Notes (Signed)
   Subjective:    Patient ID: Marissa Lowe, female    DOB: 1981-05-11, 38 y.o.   MRN: [id number]  HPI Chief Complaint  Patient presents with  . stomach pain    stomach pain- 2 weeks that comes and goes near her c-section scar. hands itching- flare up of ezcema- going on 2 months.    She is here with a 2-week history of discomfort around her C-section scar.  C-section was more than 2 years ago.  Her wound dehisced at that time and took a long time to heal. She has noticed pain with certain movements only. States she feels a bulge in that area.   She is also complaining of 24-month history of bilateral hand and wrist rash and pruritus.   She was seen in an urgent care in December and diagnosed with dyshidrotic eczema.  She was prescribed oral and topical steroids at that time.  Symptoms completely resolved. States shortly after stopping the medication her symptoms returned. She works in [employer] area for a L[employer]. States she wears gloves that are latex and powder free. States her hands sweat a lot.  States her hands only started breaking out after her last pregnancy. Denies rash to any other area.   Denies fever, chills, chest pain, shortness of breath, cough, N/V/D or constipation.   Reviewed allergies, medications, past medical, surgical, family, and social history.   Review of Systems Pertinent positives and negatives in the history of present illness.     Objective:   Physical Exam Constitutional:      General: She is not in acute distress.    Appearance: She is obese. She is not ill-appearing.  Cardiovascular:     Rate and Rhythm: Normal rate and regular rhythm.     Pulses: Normal pulses.     Heart sounds: Normal heart sounds.  Pulmonary:     Effort: Pulmonary effort is normal.     Breath sounds: Normal breath sounds.  Abdominal:     General: A surgical scar is present. Bowel sounds are normal.     Palpations: Abdomen is soft.     Hernia: A hernia is present.     Comments: C-section incision well healed, TTP to midline incision.  Hernia palpable between umbilicus and surgical incision  Skin:    General: Skin is warm and dry.     Comments: Bilateral anterior wrists with hyperpigmentation, bilatearl hands with multiple vesicles on palmar surfaces, as well as lateral surfaces of her fingers. Some scaling and dessication but also weeping in other areas. No sign of secondary bacterial infection  Neurological:     Mental Status: She is alert.    BP 130/88   Pulse 78   Temp 98.2 F (36.8 C) (Oral)   Resp 16   Wt (!) 403 lb 3.2 oz (182.9 kg)   SpO2 98%   BMI 67.10 kg/m       Assessment & Plan:  Dyshidrotic eczema - Plan: triamcinolone cream (KENALOG) 0.5 %  Abdominal wall hernia - Plan: Ambulatory referral to General Surgery  She will use the triamcinolone cream and see dermatologist later this week. She is  scheduled to see dermatology on Friday, this was done by my CMA.   Discussed red flag symptoms of abdominal wall hernia. Referral to surgery for this.

## 2018-10-30 NOTE — Patient Instructions (Addendum)
Use the steroid cream as prescribed and wear white cotton gloves at night while you sleep to keep the medication on your skin. We are trying to get you an appointment with a dermatologist as soon as possible.   You should hear from [place] [facility] to be seen for the hernia.     Dyshidrotic Eczema Dyshidrotic eczema (pompholyx) is a type of eczema that causes very itchy (pruritic), fluid-filled blisters (vesicles) to form on the hands and feet. It can affect people of any age, but is more common before the age of 13. There is no cure, but treatment and certain lifestyle changes can help relieve symptoms. What are the causes? The cause of this condition is not known. What increases the risk? You are more likely to develop this condition if:  You wash your hands frequently.  You have a personal history or family history of eczema, allergies, asthma, or hay fever.  You are allergic to metals such as nickel or cobalt.  You work with cement.  You smoke. What are the signs or symptoms? Symptoms of this condition may affect the hands, feet, or both. Symptoms may come and go (recur), and may include:  Severe itching, which may happen before blisters appear.  Blisters. These may form suddenly. ? In the early stages, blisters may form near the fingertips. ? In severe cases, blisters may grow to large blister masses (bullae). ? Blisters resolve in 2-3 weeks without bursting. This is followed by a dry phase in which itching eases.  Pain and swelling.  Cracks or long, narrow openings (fissures) in the skin.  Severe dryness.  Ridges on the nails. How is this diagnosed? This condition may be diagnosed based on:  A physical exam.  Your symptoms.  Your medical history.  Skin scrapings to rule out a fungal infection.  Testing a swab of fluid for bacteria (culture).  Removing and checking a small piece of skin (biopsy) in order to test for infection or to rule out other  conditions.  Skin patch tests. These tests involve taking patches that contain possible allergens and placing them on your back. Your health care provider will wait a few days and then check to see if an allergic reaction occurred. These tests may be done if your health care provider suspects allergic reactions, or to rule out other types of eczema. You may be referred to a health care provider who specializes in the skin (dermatologist) to help diagnose and treat this condition. How is this treated? There is no cure for this condition, but treatment can help relieve symptoms. Depending on how many blisters you have and how severe they are, your health care provider may suggest:  Avoiding allergens, irritants, or triggers that worsen symptoms. This may involve lifestyle changes such as: ? Using different lotions or soaps. ? Avoiding hot weather or places that will cause you to sweat a lot. ? Managing stress with coping techniques such as relaxation and exercise, and asking for help when you need it. ? Diet changes as recommended by your health care provider.  Using a clean, damp towel (cool compress) to relieve symptoms.  Soaking in a bath that contains a type of salt that relieves irritation (aluminum acetate soaks).  Medicine taken by mouth to reduce itching (oral antihistamines).  Medicine applied to the skin to reduce swelling and irritation (topical corticosteroids).  Medicine that reduces the activity of the body's disease-fighting system (immunosuppressants) to treat inflammation. This may be given in severe cases.  Antibiotic medicines to treat bacterial infection.  Light therapy (phototherapy). This involves shining ultraviolet (UV) light on affected skin in order to reduce itchiness and inflammation. Follow these instructions at home: Bathing and skin care   Wash skin gently. After bathing or washing your hands, pat your skin dry. Avoid rubbing your skin.  Remove all jewelry  before bathing. If the skin under the jewelry stays wet, blisters may form or get worse.  Apply cool compresses as told by your health care provider: ? Soak a clean towel in cool water. ? Wring out excess water until towel is damp. ? Place the towel over affected skin. Leave the towel on for 20 minutes at a time, 2-3 times a day.  Use mild soaps, cleansers, and lotions that do not contain dyes, perfumes, or other irritants.  Keep your skin hydrated. To do this: ? Avoid very hot water. Take lukewarm baths or showers. ? Apply moisturizer within three minutes of bathing. This locks in moisture. Medicines  Take and apply over-the-counter and prescription medicines only as told by your health care provider.  If you were prescribed antibiotic medicine, take or apply it as told by your health care provider. Do not stop using the antibiotic even if you start to feel better. General instructions  Identify and avoid triggers and allergens.  Keep fingernails short to avoid breaking open the skin while scratching.  Use waterproof gloves to protect your hands when doing work that keeps your hands wet for a long time.  Wear socks to keep your feet dry.  Do not use any products that contain nicotine or tobacco, such as cigarettes and e-cigarettes. If you need help quitting, ask your health care provider.  Keep all follow-up visits as told by your health care provider. This is important. Contact a health care provider if:  You have symptoms that do not go away.  You have signs of infection, such as: ? Crusting, pus, or a bad smell. ? More redness, swelling, or pain. ? Increased warmth in the affected area. Summary  Dyshidrotic eczema (pompholyx) is a type of eczema that causes very itchy (pruritic), fluid-filled blisters (vesicles) to form on the hands and feet.  The cause of this condition is not known.  There is no cure for this condition, but treatment can help relieve symptoms.  Treatment depends on how many blisters you have and how severe they are.  Use mild soaps, cleansers, and lotions that do not contain dyes, perfumes, or other irritants. Keep your skin hydrated. This information is not intended to replace advice given to you by your health care provider. Make sure you discuss any questions you have with your health care provider. Document Released: 12/23/2016 Document Revised: 12/23/2016 Document Reviewed: 12/23/2016 Elsevier Interactive Patient Education  2019 [employer] Inc.   Hernia, Adult     A hernia happens when tissue inside your body pushes out through a weak spot in your belly muscles (abdominal wall). This makes a round lump (bulge). The lump may be:  In a scar from surgery that was done in your belly (incisional hernia).  Near your belly button (umbilical hernia).  In your groin (inguinal hernia). Your groin is the area where your leg meets your lower belly (abdomen). This kind of hernia could also be: ? In your scrotum, if you are female. ? In folds of skin around your vagina, if you are female.  In your upper thigh (femoral hernia).  Inside your belly (hiatal hernia). This happens when  your stomach slides above the muscle between your belly and your chest (diaphragm). If your hernia is small and it does not cause pain, you may not need treatment. If your hernia is large or it causes pain, you may need surgery. Follow these instructions at home: Activity  Avoid stretching or overusing (straining) the muscles near your hernia. Straining can happen when you: ? Lift something heavy. ? Poop (have a bowel movement).  Do not lift anything that is heavier than 10 lb (4.5 kg), or the limit that you are told, until your doctor says that it is safe.  Use the strength of your legs when you lift something heavy. Do not use only your back muscles to lift. General instructions  Do these things if told by your doctor so you do not have trouble pooping  (constipation): ? Drink enough fluid to keep your pee (urine) pale yellow. ? Eat foods that are high in fiber. These include fresh fruits and vegetables, whole grains, and beans. ? Limit foods that are high in fat and processed sugars. These include foods that are fried or sweet. ? Take medicine for trouble pooping.  When you cough, try to cough gently.  You may try to push your hernia in by very gently pressing on it when you are lying down. Do not try to force the bulge back in if it will not push in easily.  If you are overweight, work with your doctor to lose weight safely.  Do not use any products that have nicotine or tobacco in them. These include cigarettes and e-cigarettes. If you need help quitting, ask your doctor.  If you will be having surgery (hernia repair), watch your hernia for changes in shape, size, or color. Tell your doctor if you see any changes.  Take over-the-counter and prescription medicines only as told by your doctor.  Keep all follow-up visits as told by your doctor. Contact a doctor if:  You get new pain, swelling, or redness near your hernia.  You poop fewer times in a week than normal.  You have trouble pooping.  You have poop (stool) that is more dry than normal.  You have poop that is harder or larger than normal. Get help right away if:  You have a fever.  You have belly pain that gets worse.  You feel sick to your stomach (nauseous).  You throw up (vomit).  Your hernia cannot be pushed in by very gently pressing on it when you are lying down. Do not try to force the bulge back in if it will not push in easily.  Your hernia: ? Changes in shape or size. ? Changes color. ? Feels hard or it hurts when you touch it. These symptoms may represent a serious problem that is an emergency. Do not wait to see if the symptoms will go away. Get medical help right away. Call your local emergency services (911 in the [place] Summary  A hernia  happens when tissue inside your body pushes out through a weak spot in the belly muscles. This creates a bulge.  If your hernia is small and it does not hurt, you may not need treatment. If your hernia is large or it hurts, you may need surgery.  If you will be having surgery, watch your hernia for changes in shape, size, or color. Tell your doctor about any changes. This information is not intended to replace advice given to you by your health care provider. Make sure you discuss  any questions you have with your health care provider. Document Released: 01/27/2010 Document Revised: 05/11/2017 Document Reviewed: 05/11/2017 Elsevier Interactive Patient Education  2019 [employer].

## 2018-11-01 ENCOUNTER — Telehealth: Payer: Self-pay | Admitting: *Deleted

## 2018-11-01 NOTE — Telephone Encounter (Signed)
-----   Message from Joselyn Arrow, MD sent at 10/30/2018  5:30 PM EDT ----- Regarding: needs visit She saw Vickie for acute problems.  Looks like I haven't seen her since 09/2017, she canceled her 03/2018 appt, and has no follow-up scheduled.  According to med refills, she is likely out of her BP meds, though her BP was okay at her visit. She needs to have med check (vs CPE if available) scheduled soon--hasn't had any labs since 2018, and likely needs refills. Thanks for looking into this and scheduling.

## 2018-11-01 NOTE — Telephone Encounter (Signed)
Left message on patient's voicemail asking her to please return my call and ask for me to schedule a med check or CPE (if we can get her in soon) as Dr.Knapp was wanting to see her.

## 2018-11-01 NOTE — Telephone Encounter (Signed)
Patient called me back and since she now has the [facility] she is going to schedule with a [facility] doctor for primary care this year. She said if her insurance changes she will let us know.

## 2019-09-24 DIAGNOSIS — U071 COVID-19: Secondary | ICD-10-CM

## 2019-09-24 HISTORY — DX: COVID-19: U07.1

## 2019-10-23 NOTE — Progress Notes (Signed)
Start time: 10:53 End time:  11:10  Virtual Visit via Video Note  I connected with Marissa Lowe on 10/23/19 at 10:30 AM EST by a video enabled telemedicine application and verified that I am speaking with the correct person using two identifiers.  Location: Patient: home Provider: office   I discussed the limitations of evaluation and management by telemedicine and the availability of in person appointments. The patient expressed understanding and agreed to proceed.  History of Present Illness:  Chief Complaint  Patient presents with  . Abdominal Pain    thinks she has a hernia, when she bends down she has to massge her stomach when she stands back up. Thinks her iron might be low becasue you are cold. Legs are restless when she is sleeping and it wakes her up. She has [place] for insurance now. Still feeling fatigued from [place]. Ran out of amlodipine a couple of months ago but is taking 2,000iu of vitamin D daily.    When she learns forward she notices a sharp pain in her stomach.  She sees a knot, and needs to massage it for it to go away.  It is in the upper right stomach.  She worries that it is a hernia. She only notices this with bending, like leaning forward, vacuuming.  She denies nausea, vomiting, bowel changes. No persistent pain.  Yesterday she noticed restless legs, woke her up last night.  Had a cramp in her finger while at work yesterday. Urine is yellow, not particularly dark. Drinks 3 12-oz bottles of water daily. She drinks tea twice daily (either sweet tea or hot tea with honey).  Tested + for COVID-19 on 10/02/19.  Some residual fatigue; denies cough, shortness of breath.  2/21 seen at [facility] for L conjunctivitis and stye, treated with polytrim drops and Bactrim. This has resolved, has 1 last pill to take tomorrow.  Pt with HTN and vitamin D deficiency, not seen in our office in over a year due to change to [facility] insurance last year. Last labs done 2018. Review of Care  Everywhere--only had acute visits (rashes, joint pains), no labs done. She has been out of amlodipine for a couple of months. Checks BP infrequently, with her sister's monitor.  See below for today's value. She denies headaches, chest pain. She is compliant with taking vitamin D.  PMH, PSH, SH reviewed  Outpatient Encounter Medications as of 10/24/2019  Medication Sig  . cholecalciferol (VITAMIN D) 1000 UNITS tablet Take 2,000 Units at bedtime by mouth.   Marland Kitchen acetaminophen (TYLENOL) 500 MG tablet Take 500-1,000 mg by mouth every 6 (six) hours as needed for mild pain, moderate pain or headache.   Marland Kitchen amLODipine (NORVASC) 10 MG tablet Take 1 tablet (10 mg total) by mouth daily.  Marland Kitchen triamcinolone cream (KENALOG) 0.5 % Apply 1 application topically 3 (three) times daily. (Patient not taking: Reported on 10/24/2019)  . [DISCONTINUED] amLODipine (NORVASC) 10 MG tablet Take 1 tablet (10 mg total) daily by mouth. (Patient not taking: Reported on 10/24/2019)   No facility-administered encounter medications on file as of 10/24/2019.   Allergies  Allergen Reactions  . Penicillins Hives and Other (See Comments)    Has patient had a PCN reaction causing immediate rash, facial/tongue/throat swelling, SOB or lightheadedness with hypotension: No Has patient had a PCN reaction causing severe rash involving mucus membranes or skin necrosis: No Has patient had a PCN reaction that required hospitalization No Has patient had a PCN reaction occurring within the last  10 years: No If all of the above answers are "NO", then may proceed with Cephalosporin use.   ROS:  No fever, chills, URI symptoms, headaches, dizziness, chest pain, palpitations. No nausea, vomiting, heartburn or bowel changes. Pain/swelling RUQ per HPI. No bleeding, bruising, rash, or other complaints. Moods are good.   Observations/Objective:  BP 137/82   Pulse 98   Temp 98.1 F (36.7 C) (Temporal)   Ht 5\' 4"  (1.626 m)   LMP 10/05/2019   BMI 69.21  kg/m   Wt Readings from Last 3 Encounters:  10/30/18 (!) 403 lb 3.2 oz (182.9 kg)  10/19/17 (!) 391 lb (177.4 kg)  07/18/17 (!) 396 lb (179.6 kg)   Well-appearing, pleasant female, in no distress Her 40 year old was very loud in the background. She is alert and oriented, EOMI, cranial nerves grossly intact. Area of concern is RUQ, nontender and denies bulge currently. Exam is limited due to virtual nature of the visit.   Assessment and Plan:  Muscle spasm - RUQ.  Not a location for a hernia.  Encouraged her to drink more water.  Essential hypertension - restart amlodipine. Monitor BP at home, and f/u in office within 2-3 weeks. - Plan: amLODipine (NORVASC) 10 MG tablet  Muscle cramps - finger and legs.  Encouraged to drink more water  F/u visit in 2 weeks--to bring list of BP's and monitor, and will come fasting for labs. (med check)   Restart amlodipine.   Check your blood pressure regularly and record on a piece of paper. Bring this paper and your blood pressure monitor to your visit. If you are feeling dizzy or lightheaded, and your blood pressure is under 105/60, then you can cut the amlodipine in half, as it might be too much for you.    Come fasting to your visit (do not eat or drink anything other than water or black coffee. You may take your medications/vitamins with water.   I suspect that the lump you feel is likely a muscle spasm. Increase your water intake--drinking at least 6-8 eight ounce glasses of water daily, and cut back on caffeine.  Drink enough water such that your urine is very pale yellow, if not clear.  We will further evaluate this lump at your visit.  It doesn't sound like a hernia based on the location.   Follow Up Instructions:    I discussed the assessment and treatment plan with the patient. The patient was provided an opportunity to ask questions and all were answered. The patient agreed with the plan and demonstrated an understanding of the  instructions.   The patient was advised to call back or seek an in-person evaluation if the symptoms worsen or if the condition fails to improve as anticipated.  I provided 18 minutes of non-face-to-face time during this encounter.   2, MD

## 2019-10-24 ENCOUNTER — Emergency Department (HOSPITAL_COMMUNITY)
Admission: EM | Admit: 2019-10-24 | Discharge: 2019-10-24 | Disposition: A | Discharge status: Home / Self Care | Payer: 59 | Attending: Emergency Medicine | Admitting: Emergency Medicine

## 2019-10-24 ENCOUNTER — Emergency Department (HOSPITAL_COMMUNITY): Payer: 59

## 2019-10-24 ENCOUNTER — Encounter: Payer: Self-pay | Admitting: Family Medicine

## 2019-10-24 ENCOUNTER — Ambulatory Visit (INDEPENDENT_AMBULATORY_CARE_PROVIDER_SITE_OTHER): Payer: 59 | Admitting: Family Medicine

## 2019-10-24 ENCOUNTER — Other Ambulatory Visit: Payer: Self-pay

## 2019-10-24 ENCOUNTER — Encounter (HOSPITAL_COMMUNITY): Payer: Self-pay

## 2019-10-24 VITALS — BP 137/82 | HR 98 | Temp 98.1°F | Ht 64.0 in

## 2019-10-24 DIAGNOSIS — G2581 Restless legs syndrome: Secondary | ICD-10-CM | POA: Insufficient documentation

## 2019-10-24 DIAGNOSIS — M62838 Other muscle spasm: Secondary | ICD-10-CM

## 2019-10-24 DIAGNOSIS — I10 Essential (primary) hypertension: Secondary | ICD-10-CM | POA: Insufficient documentation

## 2019-10-24 DIAGNOSIS — R252 Cramp and spasm: Secondary | ICD-10-CM

## 2019-10-24 DIAGNOSIS — R109 Unspecified abdominal pain: Secondary | ICD-10-CM | POA: Diagnosis present

## 2019-10-24 DIAGNOSIS — Z87891 Personal history of nicotine dependence: Secondary | ICD-10-CM | POA: Insufficient documentation

## 2019-10-24 LAB — URINALYSIS, ROUTINE W REFLEX MICROSCOPIC
Bilirubin Urine: NEGATIVE
Glucose, UA: NEGATIVE mg/dL
Hgb urine dipstick: NEGATIVE
Ketones, ur: 20 mg/dL — AB
Leukocytes,Ua: NEGATIVE
Nitrite: NEGATIVE
Protein, ur: 30 mg/dL — AB
Specific Gravity, Urine: 1.034 — ABNORMAL HIGH (ref 1.005–1.030)
pH: 5 (ref 5.0–8.0)

## 2019-10-24 LAB — COMPREHENSIVE METABOLIC PANEL
ALT: 64 U/L — ABNORMAL HIGH (ref 0–44)
AST: 56 U/L — ABNORMAL HIGH (ref 15–41)
Albumin: 3.8 g/dL (ref 3.5–5.0)
Alkaline Phosphatase: 63 U/L (ref 38–126)
Anion gap: 12 (ref 5–15)
BUN: 10 mg/dL (ref 6–20)
CO2: 23 mmol/L (ref 22–32)
Calcium: 9.1 mg/dL (ref 8.9–10.3)
Chloride: 101 mmol/L (ref 98–111)
Creatinine, Ser: 0.68 mg/dL (ref 0.44–1.00)
GFR calc Af Amer: >60 mL/min (ref >60)
GFR calc non Af Amer: >60 mL/min (ref >60)
Glucose, Bld: 118 mg/dL — ABNORMAL HIGH (ref 70–99)
Potassium: 3.9 mmol/L (ref 3.5–5.1)
Sodium: 136 mmol/L (ref 135–145)
Total Bilirubin: 0.6 mg/dL (ref 0.3–1.2)
Total Protein: 7.5 g/dL (ref 6.5–8.1)

## 2019-10-24 LAB — CBC
HCT: 36.8 % (ref 36.0–46.0)
Hemoglobin: 12.3 g/dL (ref 12.0–15.0)
MCH: 22.7 pg — ABNORMAL LOW (ref 26.0–34.0)
MCHC: 33.4 g/dL (ref 30.0–36.0)
MCV: 67.9 fL — ABNORMAL LOW (ref 80.0–100.0)
Platelets: 196 10*3/uL (ref 150–400)
RBC: 5.42 MIL/uL — ABNORMAL HIGH (ref 3.87–5.11)
RDW: 17.1 % — ABNORMAL HIGH (ref 11.5–15.5)
WBC: 3.2 10*3/uL — ABNORMAL LOW (ref 4.0–10.5)
nRBC: 0 % (ref 0.0–0.2)

## 2019-10-24 LAB — I-STAT BETA HCG BLOOD, ED (MC, WL, AP ONLY): I-stat hCG, quantitative: <5 m[IU]/mL (ref <5)

## 2019-10-24 LAB — LIPASE, BLOOD: Lipase: 25 U/L (ref 11–51)

## 2019-10-24 MED ORDER — AMLODIPINE BESYLATE 10 MG PO TABS: 10.0000 mg | ORAL_TABLET | Freq: Every day | ORAL | 0 refills | Status: DC

## 2019-10-24 MED ORDER — METHOCARBAMOL 500 MG PO TABS: 1000.0000 mg | ORAL_TABLET | Freq: Every day | ORAL | 0 refills | Status: DC

## 2019-10-24 MED ORDER — METHOCARBAMOL 500 MG PO TABS
1000.0000 mg | ORAL_TABLET | Freq: Once | ORAL | Status: AC
  Administered 2019-10-24: 1000 mg via ORAL
  Filled 2019-10-24: qty 2

## 2019-10-24 NOTE — ED Triage Notes (Signed)
Pt reports bilateral leg pain since yesterday describes as tingling and lower abd pain, denies n/v/d. Pt ambulatory upon arrival to ed

## 2019-10-24 NOTE — ED Provider Notes (Signed)
[facility]ERGENCY DEPARTMENT Provider Note   CSN: [id number] Arrival date & time: 10/24/19  1347     History Chief Complaint  Patient presents with  . Leg Pain  . Abdominal Pain    Marissa Lowe is a 39 y.o. female.  Patient with history of cesarean section presents the emergency department with complaint of bilateral lower extremity "burning" and leg restlessness as well as right-sided abdominal pain.  Patient describes intermittent spasms of pain in her right abdomen.  She denies any fevers, nausea, vomiting, diarrhea.  She denies any difficulties with urination, dysuria, hematuria, increased frequency or urgency.  No constipation.  Pain comes and go.  Patient had a video visit with her PCP earlier today.  She reported these symptoms to her.  Plan was to restart blood pressure medication and have her hydrate thinking that this was a muscle spasm.  Patient was going to follow-up in 2 weeks.  Patient was concerned about hernia. Patient denies warning symptoms of back pain including: fecal incontinence, urinary retention or overflow incontinence, night sweats, waking from sleep with back pain, unexplained fevers or weight loss, h/o cancer, IVDU, recent trauma.           Past Medical History:  Diagnosis Date  . Alpha thalassemia trait   . Arthritis of foot, left    (see imaging 02/2014)  . Hypertension   . Morbid obesity with BMI of 70 and over, adult ([place])   . Seasonal allergies   . Trichomonal vaginitis 10/2015  . Vitamin D deficiency     Patient Active Problem List   Diagnosis Date Noted  . Essential hypertension 03/29/2017  . Alpha thalassemia trait 03/26/2017  . Wound dehiscence, cesarean 05/25/2016  . S/P cesarean section 05/14/2016  . BMI 70 and over, adult ([place]) 05/09/2014  . Vitamin D deficiency 03/02/2013    Past Surgical History:  Procedure Laterality Date  . CESAREAN SECTION N/A 05/14/2016   Procedure: CESAREAN SECTION;  Surgeon: Tania Ade, MD;  Location: [facility];  Service: Obstetrics;  Laterality: N/A;     OB History    Gravida  1   Para  1   Term  1   Preterm  0   AB  0   Living  1     SAB  0   TAB  0   Ectopic  0   Multiple  0   Live Births  1           Family History  Problem Relation Age of Onset  . Hypertension Sister   . Hypertension Brother   . Diabetes Mother   . Heart failure Father   . Hypertension Father   . Glaucoma Brother   . Cancer Neg Hx   . Stroke Neg Hx     Social History   Tobacco Use  . Smoking status: Former Smoker    Types: Cigarettes    Quit date: 09/01/2009    Years since quitting: 10.1  . Smokeless tobacco: Never Used  Substance Use Topics  . Alcohol use: No  . Drug use: No    Home Medications Prior to Admission medications   Medication Sig Start Date End Date Taking? Authorizing Provider  acetaminophen (TYLENOL) 500 MG tablet Take 500-1,000 mg by mouth every 6 (six) hours as needed for mild pain, moderate pain or headache.     [provider]  amLODipine (NORVASC) 10 MG tablet Take 1 tablet (10 mg total) by mouth daily.  10/24/19   Joselyn Arrow, MD  cholecalciferol (VITAMIN D) 1000 UNITS tablet Take 2,000 Units at bedtime by mouth.     [provider]  triamcinolone cream (KENALOG) 0.5 % Apply 1 application topically 3 (three) times daily. Patient not taking: Reported on 10/24/2019 10/30/18   Avanell Shackleton, NP-C    Allergies    Penicillins  Review of Systems   Review of Systems  Constitutional: Negative for fever.  HENT: Negative for rhinorrhea and sore throat.   Eyes: Negative for redness.  Respiratory: Negative for cough.   Cardiovascular: Negative for chest pain.  Gastrointestinal: Positive for abdominal pain. Negative for diarrhea, nausea and vomiting.  Genitourinary: Negative for difficulty urinating, dysuria, frequency and urgency.  Musculoskeletal: Positive for myalgias (restless legs).  Skin: Negative for rash.    Neurological: Negative for weakness, numbness (+ burning only) and headaches.  Psychiatric/Behavioral: Positive for sleep disturbance (due to restlessness).    Physical Exam Updated Vital Signs BP (!) 137/94 (BP Location: Right Wrist)   Pulse 96   Temp 98.5 F (36.9 C) (Oral)   Resp 18   LMP 10/05/2019   SpO2 100%   Physical Exam Vitals and nursing note reviewed.  Constitutional:      Appearance: She is well-developed.  HENT:     Head: Normocephalic and atraumatic.  Eyes:     General:        Right eye: No discharge.        Left eye: No discharge.     Conjunctiva/sclera: Conjunctivae normal.  Cardiovascular:     Rate and Rhythm: Normal rate and regular rhythm.     Heart sounds: Normal heart sounds.  Pulmonary:     Effort: Pulmonary effort is normal.     Breath sounds: Normal breath sounds.  Abdominal:     Palpations: Abdomen is soft.     Tenderness: There is abdominal tenderness in the right upper quadrant and right lower quadrant. There is no guarding or rebound. Negative signs include Murphy's sign.     Comments: Exam limited by habitus.  Patient is warmness with any palpation over the right upper and right lower quadrants.  No focal tenderness.  Musculoskeletal:     Cervical back: Normal range of motion and neck supple.  Skin:    General: Skin is warm and dry.  Neurological:     Mental Status: She is alert.     Cranial Nerves: Cranial nerves are intact.     Sensory: Sensation is intact. No sensory deficit.     Motor: Motor function is intact.     Gait: Gait normal.     Comments: Moves lower extremities normally, gross sensation intact. Patient moving legs around frequently 2/2 restlessness. Normal movement and strength.      ED Results / Procedures / Treatments   Labs (all labs ordered are listed, but only abnormal results are displayed) Labs Reviewed  COMPREHENSIVE METABOLIC PANEL - Abnormal; Notable for the following components:      Result Value    Glucose, Bld 118 (*)    AST 56 (*)    ALT 64 (*)    All other components within normal limits  CBC - Abnormal; Notable for the following components:   WBC 3.2 (*)    RBC 5.42 (*)    MCV 67.9 (*)    MCH 22.7 (*)    RDW 17.1 (*)    All other components within normal limits  URINALYSIS, ROUTINE W REFLEX MICROSCOPIC - Abnormal; Notable for  the following components:   Color, Urine AMBER (*)    Specific Gravity, Urine 1.034 (*)    Ketones, ur 20 (*)    Protein, ur 30 (*)    Bacteria, UA FEW (*)    All other components within normal limits  LIPASE, BLOOD  I-STAT BETA HCG BLOOD, ED ([facility], [facility], [facility] ONLY)    EKG None  Radiology US Abdomen Limited RUQ  Result Date: 10/24/2019 CLINICAL DATA:  39 year old female with abdominal pain. EXAM: ULTRASOUND ABDOMEN LIMITED RIGHT UPPER QUADRANT COMPARISON:  Right upper quadrant ultrasound dated 10/07/2014. FINDINGS: Gallbladder: No gallstones or wall thickening visualized. No sonographic Murphy sign noted by sonographer. Common bile duct: Diameter: 6 mm Liver: There is diffuse increased liver echogenicity most commonly seen in the setting of fatty infiltration. Superimposed inflammation or fibrosis is not excluded. Clinical correlation is recommended. Portal vein is patent on color Doppler imaging with normal direction of blood flow towards the liver. Other: None. IMPRESSION: Fatty liver, otherwise unremarkable right upper quadrant ultrasound. Electronically Signed   By: Elgie Collard M.D.   On: 10/24/2019 20:52    Procedures Procedures (including critical care time)  Medications Ordered in ED Medications  methocarbamol (ROBAXIN) tablet 1,000 mg (1,000 mg Oral Given 10/24/19 2127)    ED Course  I have reviewed the triage vital signs and the nursing notes.  Pertinent labs & imaging results that were available during my care of the patient were reviewed by me and considered in my medical decision making (see chart for details).  Patient seen and  examined.  Reviewed PCP note from earlier today.  Lab work-up was overall reassuring.  Slight leukopenia at 3.2.  Very slight elevation in liver enzymes.  Patient does not have any focal tenderness but exam is difficult.  Offered right upper quadrant also ultrasound to evaluate for gallstones given intermittent nature and location of pain.  Discussed with patient I have low concern for hernia.  Agree that this could be related to abdominal wall pain.  Patient's lower extremity symptoms are more like restlessness.  She does not have any sensation deficit on my exam.  She is moving her extremities well.  She is able to get up out of the bed and walk to the restroom down the hall without any difficulty.  No red flags otherwise.  Do not feel she requires emergent MRI or other imaging given lack of back pain.  Electrolytes are okay.  Will give a dose of Robaxin here to see if this helps.  Vital signs reviewed and are as follows: BP (!) 137/94 (BP Location: Right Wrist)   Pulse 96   Temp 98.5 F (36.9 C) (Oral)   Resp 18   LMP 10/05/2019   SpO2 100%   9:56 PM ultrasound shows fatty infiltration of the liver without gallbladder disease or gallstones.    Patient updated on results.  Patient's abdominal exam is unchanged. She will be discharged to home.  Will give Robaxin to try although I am uncertain how well this will work for her leg restlessness.  If her abdominal pain is musculoskeletal in nature it may provide some relief.  Patient counseled on proper use of muscle relaxant medication.  They were told not to drink alcohol, drive any vehicle, or do any dangerous activities while taking this medication.  Patient verbalized understanding.  The patient was urged to return to the Emergency Department immediately with worsening of current symptoms, worsening abdominal pain, persistent vomiting, blood noted in stools, fever, or  any other concerns. The patient verbalized understanding.      MDM  Rules/Calculators/A&P                      Abdominal pain: Patient with abdominal pain, right-sided, nonfocal. Vitals are stable, no fever. Labs no leukocytosis, mild transaminitis. Imaging ultrasound without signs of gallstones or cholecystitis. No signs of dehydration, patient is tolerating PO's. Lungs are clear and no signs suggestive of PNA. Low concern for appendicitis, cholecystitis, pancreatitis, ruptured viscus, UTI, kidney stone, aortic dissection, aortic aneurysm or other emergent abdominal etiology. Supportive therapy indicated with return if symptoms worsen.   Restless legs: Patient describes a burning in the legs but has normal sensation.  Symptoms are bilateral.  Patient without any red flag signs and symptoms of back pain.  She is able to get up and walk without any discomfort or gait abnormality.  No apparent lower extremity weakness or foot drop.  Do not suspect stroke or spinal cord etiology.    Final Clinical Impression(s) / ED Diagnoses Final diagnoses:  Abdominal pain  Restless legs    Rx / DC Orders ED Discharge Orders         Ordered    methocarbamol (ROBAXIN) 500 MG tablet  Daily at bedtime     10/24/19 2150           Renne Crigler, PA-C 10/24/19 2158    Geoffery Lyons, MD 10/25/19 [phone]

## 2019-10-24 NOTE — Patient Instructions (Addendum)
Restart amlodipine.   Check your blood pressure regularly and record on a piece of paper. Bring this paper and your blood pressure monitor to your visit. If you are feeling dizzy or lightheaded, and your blood pressure is under 105/60, then you can cut the amlodipine in half, as it might be too much for you.    Come fasting to your visit (do not eat or drink anything other than water or black coffee. You may take your medications/vitamins with water.   Cut back on sweet tea (to help lose weight).  I suspect that the lump you feel is likely a muscle spasm. Increase your water intake--drinking at least 6-8 eight ounce glasses of water daily, and cut back on caffeine.  Drink enough water such that your urine is very pale yellow, if not clear. This should also help with cramps in fingers and legs.  We will evaluate your electrolytes for other causes at your visit.  We will further evaluate this lump at your visit.  It doesn't sound like a hernia based on the location.

## 2019-10-24 NOTE — ED Notes (Signed)
Pt transported to US

## 2019-10-24 NOTE — Discharge Instructions (Signed)
Please read and follow all provided instructions.  Your diagnoses today include:  1. Abdominal pain   2. Restless legs     Tests performed today include:  Blood counts and electrolytes  Blood tests to check liver and kidney function - shows slightly high liver function tests, please have these monitored by your doctor.   Blood tests to check pancreas function  Urine test to look for infection  Ultrasound of your gallbladder - shows fatty liver, but no gallstones or gallbladder disease  Vital signs. See below for your results today.   Medications prescribed:   Robaxin (methocarbamol) - muscle relaxer medication  DO NOT drive or perform any activities that require you to be awake and alert because this medicine can make you drowsy.   Take any prescribed medications only as directed.  Home care instructions:   Follow any educational materials contained in this packet.  Follow-up instructions: Please follow-up with your primary care provider as planned for further evaluation of your symptoms.    Return instructions:  SEEK IMMEDIATE MEDICAL ATTENTION IF:  The pain does not go away or becomes severe   A temperature above 101F develops   Repeated vomiting occurs (multiple episodes)   The pain becomes localized to portions of the abdomen. The right side could possibly be appendicitis. In an adult, the left lower portion of the abdomen could be colitis or diverticulitis.   Blood is being passed in stools or vomit (bright red or black tarry stools)   You develop chest pain, difficulty breathing, dizziness or fainting, or become confused, poorly responsive, or inconsolable (young children)  If you have any other emergent concerns regarding your health  Additional Information: Abdominal (belly) pain can be caused by many things. Your caregiver performed an examination and possibly ordered blood/urine tests and imaging (CT scan, x-rays, ultrasound). Many cases can be observed  and treated at home after initial evaluation in the emergency department. Even though you are being discharged home, abdominal pain can be unpredictable. Therefore, you need a repeated exam if your pain does not resolve, returns, or worsens. Most patients with abdominal pain don't have to be admitted to the hospital or have surgery, but serious problems like appendicitis and gallbladder attacks can start out as nonspecific pain. Many abdominal conditions cannot be diagnosed in one visit, so follow-up evaluations are very important.  Your vital signs today were: BP (!) 137/94 (BP Location: Right Wrist)    Pulse 96    Temp 98.5 F (36.9 C) (Oral)    Resp 18    LMP 10/05/2019    SpO2 100%  If your blood pressure (bp) was elevated above 135/85 this visit, please have this repeated by your doctor within one month. --------------

## 2019-10-25 ENCOUNTER — Telehealth: Payer: Self-pay | Admitting: *Deleted

## 2019-10-25 NOTE — Telephone Encounter (Signed)
Called patient to let her know that Dr. Lynelle Doctor reviewed her ED notes and to follow up. Reminded her to please fast for her next upcoming appointment here as Dr.Knapp will be drawing some fasting labs. Also let her know that her urine was VERY concentrated and to please increase her water in take as she is dehydrated. Patient verbalized understanding.

## 2019-11-11 NOTE — Progress Notes (Signed)
Chief Complaint  Patient presents with  . Hypertension    fasting med check. No new conplaints.    Seen earlier this month (virtually) with complaints of abdominal pain (concerned about hernia) and restless legs and cramping.  She was advised to increase her water intake. She had reported drinking three 12-oz bottles of water daily. Also drinking tea twice daily (either sweet tea or hot tea with honey).  She later went to the ER for further evaluation.  They confirmed no e/o hernia. She was tender in RUQ, so [place] was performed which showed: IMPRESSION: Fatty liver, otherwise unremarkable right upper quadrant ultrasound. Lab eval included urine which was very concentrated (SG 1.034), nl CBC (WBC sl low at 3.2, Hg 12.3), elevated LFT's, nonfasting glu 118, normal Cr 0.68 Lab Results  Component Value Date   ALT 64 (H) 10/24/2019   AST 56 (H) 10/24/2019   ALKPHOS 63 10/24/2019   BILITOT 0.6 10/24/2019   She was given a prescription for Robaxin. She reports it helped a little bit.  She has also been drinking a lot of water and lemon water, which has helped.  She has much less spasms, no longer has the restless legs.  Hypertension follow-up:  At her visit earlier in the month she had reported that she had run out of her amlodipine, hadn't taken for a couple of months.  This was prescribed and restarted at that visit.  She reports compliance in taking amlodipine 10mg .  According to notes from her last visit here prior to gap in care (related to changing to [facility] insurance), she was taking both amlodipine AND HCTZ 25mg  daily.  She has not been on a diuretic for over a year.  Doesn't recall any problems from it. BP's are running 127-136/79-80. Pulse 90.  She forgot to bring her sister's BP cuff to have it checked.  She also has h/o Vitamin D deficiency.  She is compliant with taking 2000 IU of Vitamin D3 daily. 03/2017 level was 29, prior levels had been much lower, when not taking supplements:  Ref  Range & Units 4 yr ago  (10/24/15) 4 yr ago  (10/13/15)  Vit D, 25-Hydroxy 30 - 100 ng/mL 15Low   11Low     Tested + for COVID-19 on 10/02/19.  She had residual fatigue for about a month, but this has finally resolved.   Since energy is better, she is walking more. She cut back on fried foods, eating more baked foods, cut back on breads. No alcohol. Cut back on soda/tea, none in the last couple of weeks.   PMH, PSH, SH reviewed  Outpatient Encounter Medications as of 11/12/2019  Medication Sig  . amLODipine (NORVASC) 10 MG tablet Take 1 tablet (10 mg total) by mouth daily.  . cholecalciferol (VITAMIN D) 1000 UNITS tablet Take 2,000 Units at bedtime by mouth.   . [DISCONTINUED] methocarbamol (ROBAXIN) 500 MG tablet Take 2 tablets (1,000 mg total) by mouth at bedtime. (Patient not taking: Reported on 11/12/2019)   No facility-administered encounter medications on file as of 11/12/2019.   Allergies  Allergen Reactions  . Penicillins Hives and Other (See Comments)    Has patient had a PCN reaction causing immediate rash, facial/tongue/throat swelling, SOB or lightheadedness with hypotension: No Has patient had a PCN reaction causing severe rash involving mucus membranes or skin necrosis: No Has patient had a PCN reaction that required hospitalization No Has patient had a PCN reaction occurring within the last 10 years: No If all  of the above answers are "NO", then may proceed with Cephalosporin use.   ROS: no headaches (slight sinus headache yesterday), dizziness, chest pain, shortness of breath. No fever, chills, URI symptoms.  No nausea, vomiting, bowel changes. No urinary complaints. Restless legs--much better since drinking more water Muscle cramps, very rare on her side (R flank) Weight loss per HPI with dietary changes No further abdominal pain or bulging. Fatigue resolved.  PHYSICAL EXAM:  BP 136/88   Pulse 80   Temp (!) 96.8 F (36 C) (Other (Comment))   Ht 5\' 4"  (1.626 m)    Wt (!) 390 lb (176.9 kg)   LMP 11/01/2019   BMI 66.94 kg/m   126/80 on repeat by MD with large cuff on R  Wt Readings from Last 3 Encounters:  11/12/19 (!) 390 lb (176.9 kg)  10/30/18 (!) 403 lb 3.2 oz (182.9 kg)  10/19/17 (!) 391 lb (177.4 kg)    Well appearing, pleasant, morbidly obese female in no distress. HEENT: PERRL, EOMI, conjunctiva and sclera are clear. Wearing mask. Neck: No lymphadenopathy or mass, no carotid bruit Heart: regular rate and rhythm, no murmur Lungs: clear bilaterally, no wheezes, rales, ronchi Back: no spinal tenderness or CVA tenderness Abdomen: obese, soft, no appreciable mass or bulge. She is midly tender in the RUQ, and also along the ribs at R lateral chest wall (had been doing some stretches/exercises) Skin: normal turgor, no rash Psych: normal mood, affect, hygiene and grooming Neuro: alert and oriented, normal gait, strength  Lab Results  Component Value Date   WBC 3.2 (L) 10/24/2019   HGB 12.3 10/24/2019   HCT 36.8 10/24/2019   MCV 67.9 (L) 10/24/2019   PLT 196 10/24/2019     Chemistry      Component Value Date/Time   NA 136 10/24/2019 1447   K 3.9 10/24/2019 1447   CL 101 10/24/2019 1447   CO2 23 10/24/2019 1447   BUN 10 10/24/2019 1447   CREATININE 0.68 10/24/2019 1447   CREATININE 0.71 07/18/2017 1638      Component Value Date/Time   CALCIUM 9.1 10/24/2019 1447   ALKPHOS 63 10/24/2019 1447   AST 56 (H) 10/24/2019 1447   ALT 64 (H) 10/24/2019 1447   BILITOT 0.6 10/24/2019 1447     (nonfasting glu 118)  Lab Results  Component Value Date   LIPASE 25 10/24/2019    ASSESSMENT/PLAN:  Essential hypertension - controlled; continue amlodipine, and efforts at weight loss - Plan: Lipid panel, amLODipine (NORVASC) 10 MG tablet  Vitamin D deficiency - continue daily supplement  Class 3 severe obesity due to excess calories with serious comorbidity and body mass index (BMI) of 60.0 to 69.9 in adult [facility]) - counseled re: risks,  diet, weight loss - Plan: Glucose, random  Essential hypertension - restart amlodipine. Monitor BP at home, and f/u in office within 2-3 weeks. - Plan: Lipid panel, amLODipine (NORVASC) 10 MG tablet  Fatty liver - elevated LFT's, fatty liver changes noted on recent [facility].  Counseled regarding potential progression, need for wt loss  Fasting glu, lipid   CPE 6 months

## 2019-11-12 ENCOUNTER — Other Ambulatory Visit: Payer: Self-pay

## 2019-11-12 ENCOUNTER — Encounter: Payer: Self-pay | Admitting: Family Medicine

## 2019-11-12 ENCOUNTER — Ambulatory Visit (INDEPENDENT_AMBULATORY_CARE_PROVIDER_SITE_OTHER): Payer: 59 | Admitting: Family Medicine

## 2019-11-12 VITALS — BP 126/80 | HR 80 | Temp 96.8°F | Ht 64.0 in | Wt 390.0 lb

## 2019-11-12 DIAGNOSIS — E559 Vitamin D deficiency, unspecified: Secondary | ICD-10-CM

## 2019-11-12 DIAGNOSIS — I10 Essential (primary) hypertension: Secondary | ICD-10-CM | POA: Diagnosis not present

## 2019-11-12 DIAGNOSIS — Z6841 Body Mass Index (BMI) 40.0 and over, adult: Secondary | ICD-10-CM

## 2019-11-12 DIAGNOSIS — K760 Fatty (change of) liver, not elsewhere classified: Secondary | ICD-10-CM

## 2019-11-12 MED ORDER — AMLODIPINE BESYLATE 10 MG PO TABS: 10.0000 mg | ORAL_TABLET | Freq: Every day | ORAL | 1 refills | Status: DC

## 2019-11-12 NOTE — Patient Instructions (Addendum)
You are eligible for the COVID vaccine based on health conditions, but they recommend waiting 3 months after COVID infection. So, plan to get it in early May. Check out the [facility] website, [email or web address] and [place]. COVID-19 Vaccine Information can be found at: ShippingScam.co.uk For questions related to vaccine distribution or appointments, please email [email or web address] or call [phone].    Congratulations on the changes you made in your diet and your weight loss so far. Continue amlodipine daily, and periodically checking your blood pressure. Goal is under 130/80, but okay if up to 135/85.  Hopefully as you lose weight, you will see the numbers get a little lower. Low sodium diet also helps keep the blood pressure down (in addition to the weight loss and exercise).   DASH Eating Plan DASH stands for "Dietary Approaches to Stop Hypertension." The DASH eating plan is a healthy eating plan that has been shown to reduce high blood pressure (hypertension). It may also reduce your risk for type 2 diabetes, heart disease, and stroke. The DASH eating plan may also help with weight loss. What are tips for following this plan?  General guidelines  Avoid eating more than 2,300 mg (milligrams) of salt (sodium) a day. If you have hypertension, you may need to reduce your sodium intake to 1,500 mg a day.  Limit alcohol intake to no more than 1 drink a day for nonpregnant women and 2 drinks a day for men. One drink equals 12 oz of beer, 5 oz of wine, or 1 oz of hard liquor.  Work with your health care provider to maintain a healthy body weight or to lose weight. Ask what an ideal weight is for you.  Get at least 30 minutes of exercise that causes your heart to beat faster (aerobic exercise) most days of the week. Activities may include walking, swimming, or biking.  Work with your health care provider or diet and nutrition  specialist (dietitian) to adjust your eating plan to your individual calorie needs.   Reading food labels   Check food labels for the amount of sodium per serving. Choose foods with less than 5 percent of the Daily Value of sodium. Generally, foods with less than 300 mg of sodium per serving fit into this eating plan.  To find whole grains, look for the word "whole" as the first word in the ingredient list. Shopping  Buy products labeled as "low-sodium" or "no salt added."  Buy fresh foods. Avoid canned foods and premade or frozen meals. Cooking  Avoid adding salt when cooking. Use salt-free seasonings or herbs instead of table salt or sea salt. Check with your health care provider or pharmacist before using salt substitutes.  Do not fry foods. Cook foods using healthy methods such as baking, boiling, grilling, and broiling instead.  Cook with heart-healthy oils, such as olive, canola, soybean, or sunflower oil. Meal planning  Eat a balanced diet that includes: ? 5 or more servings of fruits and vegetables each day. At each meal, try to fill half of your plate with fruits and vegetables. ? Up to 6-8 servings of whole grains each day. ? Less than 6 oz of lean meat, poultry, or fish each day. A 3-oz serving of meat is about the same size as a deck of cards. One egg equals 1 oz. ? 2 servings of low-fat dairy each day. ? A serving of nuts, seeds, or beans 5 times each week. ? Heart-healthy fats. Healthy fats called Omega-3 fatty acids are found  in foods such as flaxseeds and coldwater fish, like sardines, salmon, and mackerel.  Limit how much you eat of the following: ? Canned or prepackaged foods. ? Food that is high in trans fat, such as fried foods. ? Food that is high in saturated fat, such as fatty meat. ? Sweets, desserts, sugary drinks, and other foods with added sugar. ? Full-fat dairy products.  Do not salt foods before eating.  Try to eat at least 2 vegetarian meals each  week.  Eat more home-cooked food and less restaurant, buffet, and fast food.  When eating at a restaurant, ask that your food be prepared with less salt or no salt, if possible. What foods are recommended? The items listed may not be a complete list. Talk with your dietitian about what dietary choices are best for you. Grains Whole-grain or whole-wheat bread. Whole-grain or whole-wheat pasta. Brown rice. Orpah Cobb. Bulgur. Whole-grain and low-sodium cereals. Pita bread. Low-fat, low-sodium crackers. Whole-wheat flour tortillas. Vegetables Fresh or frozen vegetables (raw, steamed, roasted, or grilled). Low-sodium or reduced-sodium tomato and vegetable juice. Low-sodium or reduced-sodium tomato sauce and tomato paste. Low-sodium or reduced-sodium canned vegetables. Fruits All fresh, dried, or frozen fruit. Canned fruit in natural juice (without added sugar). Meat and other protein foods Skinless chicken or [place]. Ground chicken or [place]. Pork with fat trimmed off. Fish and seafood. Egg whites. Dried beans, peas, or lentils. Unsalted nuts, nut butters, and seeds. Unsalted canned beans. Lean cuts of beef with fat trimmed off. Low-sodium, lean deli meat. Dairy Low-fat (1%) or fat-free (skim) milk. Fat-free, low-fat, or reduced-fat cheeses. Nonfat, low-sodium ricotta or cottage cheese. Low-fat or nonfat yogurt. Low-fat, low-sodium cheese. Fats and oils Soft margarine without trans fats. Vegetable oil. Low-fat, reduced-fat, or light mayonnaise and salad dressings (reduced-sodium). Canola, safflower, olive, soybean, and sunflower oils. Avocado. Seasoning and other foods Herbs. Spices. Seasoning mixes without salt. Unsalted popcorn and pretzels. Fat-free sweets. What foods are not recommended? The items listed may not be a complete list. Talk with your dietitian about what dietary choices are best for you. Grains Baked goods made with fat, such as croissants, muffins, or some breads. Dry pasta  or rice meal packs. Vegetables Creamed or fried vegetables. Vegetables in a cheese sauce. Regular canned vegetables (not low-sodium or reduced-sodium). Regular canned tomato sauce and paste (not low-sodium or reduced-sodium). Regular tomato and vegetable juice (not low-sodium or reduced-sodium). Rosita Fire. Olives. Fruits Canned fruit in a light or heavy syrup. Fried fruit. Fruit in cream or butter sauce. Meat and other protein foods Fatty cuts of meat. Ribs. Fried meat. Tomasa Blase. Sausage. Bologna and other processed lunch meats. Salami. Fatback. Hotdogs. Bratwurst. Salted nuts and seeds. Canned beans with added salt. Canned or smoked fish. Whole eggs or egg yolks. Chicken or [place] with skin. Dairy Whole or 2% milk, cream, and half-and-half. Whole or full-fat cream cheese. Whole-fat or sweetened yogurt. Full-fat cheese. Nondairy creamers. Whipped toppings. Processed cheese and cheese spreads. Fats and oils Butter. Stick margarine. Lard. Shortening. Ghee. Bacon fat. Tropical oils, such as coconut, palm kernel, or palm oil. Seasoning and other foods Salted popcorn and pretzels. Onion salt, garlic salt, seasoned salt, table salt, and sea salt. Worcestershire sauce. Tartar sauce. Barbecue sauce. Teriyaki sauce. Soy sauce, including reduced-sodium. Steak sauce. Canned and packaged gravies. Fish sauce. Oyster sauce. Cocktail sauce. Horseradish that you find on the shelf. Ketchup. Mustard. Meat flavorings and tenderizers. Bouillon cubes. Hot sauce and Tabasco sauce. Premade or packaged marinades. Premade or packaged taco seasonings. Relishes.  Regular salad dressings. Where to find more information:  National Heart, Lung, and Blood Institute: PopSteam.is  American Heart Association: www.heart.org Summary  The DASH eating plan is a healthy eating plan that has been shown to reduce high blood pressure (hypertension). It may also reduce your risk for type 2 diabetes, heart disease, and stroke.  With the  DASH eating plan, you should limit salt (sodium) intake to 2,300 mg a day. If you have hypertension, you may need to reduce your sodium intake to 1,500 mg a day.  When on the DASH eating plan, aim to eat more fresh fruits and vegetables, whole grains, lean proteins, low-fat dairy, and heart-healthy fats.  Work with your health care provider or diet and nutrition specialist (dietitian) to adjust your eating plan to your individual calorie needs. This information is not intended to replace advice given to you by your health care provider. Make sure you discuss any questions you have with your health care provider. Document Revised: 07/22/2017 Document Reviewed: 08/02/2016 Elsevier Patient Education  2020 Elsevier Inc.  Fatty Liver Disease  Fatty liver disease occurs when too much fat has built up in your liver cells. Fatty liver disease is also called hepatic steatosis or steatohepatitis. The liver removes harmful substances from your bloodstream and produces fluids that your body needs. It also helps your body use and store energy from the food you eat. In many cases, fatty liver disease does not cause symptoms or problems. It is often diagnosed when tests are being done for other reasons. However, over time, fatty liver can cause inflammation that may lead to more serious liver problems, such as scarring of the liver (cirrhosis) and liver failure. Fatty liver is associated with insulin resistance, increased body fat, high blood pressure (hypertension), and high cholesterol. These are features of metabolic syndrome and increase your risk for stroke, diabetes, and heart disease. What are the causes? This condition may be caused by:  Drinking too much alcohol.  Poor nutrition.  Obesity.  Cushing's syndrome.  Diabetes.  High cholesterol.  Certain drugs.  Poisons.  Some viral infections.  Pregnancy. What increases the risk? You are more likely to develop this condition if you:   Abuse alcohol.  Are overweight.  Have diabetes.  Have hepatitis.  Have a high triglyceride level.  Are pregnant. What are the signs or symptoms? Fatty liver disease often does not cause symptoms. If symptoms do develop, they can include:  Fatigue.  Weakness.  Weight loss.  Confusion.  Abdominal pain.  Nausea and vomiting.  Yellowing of your skin and the white parts of your eyes (jaundice).  Itchy skin. How is this diagnosed? This condition may be diagnosed by:  A physical exam and medical history.  Blood tests.  Imaging tests, such as an ultrasound, CT scan, or MRI.  A liver biopsy. A small sample of liver tissue is removed using a needle. The sample is then looked at under a microscope. How is this treated? Fatty liver disease is often caused by other health conditions. Treatment for fatty liver may involve medicines and lifestyle changes to manage conditions such as:  Alcoholism.  High cholesterol.  Diabetes.  Being overweight or obese. Follow these instructions at home:   Do not drink alcohol. If you have trouble quitting, ask your health care provider how to safely quit with the help of medicine or a supervised program. This is important to keep your condition from getting worse.  Eat a healthy diet as told by your health  care provider. Ask your health care provider about working with a diet and nutrition specialist (dietitian) to develop an eating plan.  Exercise regularly. This can help you lose weight and control your cholesterol and diabetes. Talk to your health care provider about an exercise plan and which activities are best for you.  Take over-the-counter and prescription medicines only as told by your health care provider.  Keep all follow-up visits as told by your health care provider. This is important. Contact a health care provider if: You have trouble controlling your:  Blood sugar. This is especially important if you have diabetes.   Cholesterol.  Drinking of alcohol. Get help right away if:  You have abdominal pain.  You have jaundice.  You have nausea and vomiting.  You vomit blood or material that looks like coffee grounds.  You have stools that are black, tar-like, or bloody. Summary  Fatty liver disease develops when too much fat builds up in the cells of your liver.  Fatty liver disease often causes no symptoms or problems. However, over time, fatty liver can cause inflammation that may lead to more serious liver problems, such as scarring of the liver (cirrhosis).  You are more likely to develop this condition if you abuse alcohol, are pregnant, are overweight, have diabetes, have hepatitis, or have high triglyceride levels.  Contact your health care provider if you have trouble controlling your weight, blood sugar, cholesterol, or drinking of alcohol. This information is not intended to replace advice given to you by your health care provider. Make sure you discuss any questions you have with your health care provider. Document Revised: 07/22/2017 Document Reviewed: 05/18/2017 Elsevier Patient Education  2020 [employer].

## 2019-11-13 LAB — LIPID PANEL
Chol/HDL Ratio: 3.3 ratio (ref 0.0–4.4)
Cholesterol, Total: 179 mg/dL (ref 100–199)
HDL: 55 mg/dL (ref >39)
LDL Chol Calc (NIH): 112 mg/dL — ABNORMAL HIGH (ref 0–99)
Triglycerides: 64 mg/dL (ref 0–149)
VLDL Cholesterol Cal: 12 mg/dL (ref 5–40)

## 2019-11-13 LAB — GLUCOSE, RANDOM: Glucose: 87 mg/dL (ref 65–99)

## 2020-05-18 ENCOUNTER — Encounter: Payer: Self-pay | Admitting: Family Medicine

## 2020-05-18 NOTE — Progress Notes (Signed)
Chief Complaint  Patient presents with  . Annual Exam    nonfasting annual exam. Has a stye on her left eye. No other concerns.     Marissa Lowe is a 39 y.o. female who presents for a complete physical.   She has the following concerns:  She noticed a stye starting 3 days ago on her left lower eye.  Through the weekend it got worse, whole eye swelled. She used warm compresses on it all weekend long, and took tylenol.  Yesterday it was crusted shut, had been draining.  Swelling improved in her face, but still has a lump by the eye.  She is having pressure above the left eye, and behind the eye, has a headache.  She went to UC in 09/2019 also for a stye on the left eye.  This stye is in the same location as in February, but reports it was much worse then than it is now.  She still has some medication left from the UC (polytrim eye drops), so she started them back again, but used them up. She had also been treated with course of Bactrim x 10d, along with the polytrim eye drops.  Hypertension follow-up:  She reports compliance in taking amlodipine 10mg . She previously also took HCTZ 25mg  daily, but BP's were good (as discussed at her last visit, when she had been off diuretic for over a year), so maintained just on the amlodipine. BP's are running 128-140, mostly 130's/80's, up to 90. She reports being in pain today--headache and discomfort from the stye.  She also has h/o Vitamin D deficiency.  She is compliant with taking 2000 IU of Vitamin D3 daily plus gummy MVI daily. Last check was in 03/2017 level was 29, prior levels had been much lower, when not taking supplements:  Ref Range & Units 4 yr ago  (10/24/15) 4 yr ago  (10/13/15)  Vit D, 25-Hydroxy 30 - 100 ng/mL 15Low   11Low      Obesity: weight is up and down, tries to watch what she eats. Walks after work 30 minutes, if she isn't too tired (just 2x/week). 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 Denies stress-eating.  Occasional [facility], mostly drinks water.   Immunization History  Administered Date(s) Administered  . Influenza,inj,Quad PF,6+ Mos 05/09/2014, 05/16/2016, 06/29/2017, 05/19/2020  . Influenza-Unspecified 05/24/2015, 05/17/2019  . Tdap 03/01/2013, 05/15/2016  She had her COVID vaccines (Moderna). h/o varicella in the past, + titer in 10/2015 with prenatal labs Last Pap smear: 10/2015 (during pregnancy), normal, no high risk HPV  (+trich, BV and yeast) Last mammogram: never  Last colonoscopy: never  Last DEXA: never  Dentist: twice yearly  Ophtho: never Exercise: Walks 30 minutes after work 2-3 days/week. Some lifting at work 11/2015, moving furniture). Carries 46 yo son around.  Lipid screen: Lab Results  Component Value Date   CHOL 179 11/12/2019   HDL 55 11/12/2019   LDLCALC 112 (H) 11/12/2019   TRIG 64 11/12/2019   CHOLHDL 3.3 11/12/2019    PMH, PSH, SH and FH were reviewed and updated  Outpatient Encounter Medications as of 05/19/2020  Medication Sig  . amLODipine (NORVASC) 10 MG tablet Take 1 tablet (10 mg total) by mouth daily.  . cholecalciferol (VITAMIN D) 1000 UNITS tablet Take 2,000 Units at bedtime by mouth.   . Multiple Vitamins-Minerals (MULTI ADULT GUMMIES PO) Take 2 each by mouth daily.   No facility-administered  encounter medications on file as of 05/19/2020.   Allergies  Allergen Reactions  . Penicillins Hives and Other (See Comments)    Has patient had a PCN reaction causing immediate rash, facial/tongue/throat swelling, SOB or lightheadedness with hypotension: No Has patient had a PCN reaction causing severe rash involving mucus membranes or skin necrosis: No Has patient had a PCN reaction that required hospitalization No Has patient had a PCN reaction occurring within the last 10 years: No If all of the above answers are "NO", then may proceed with Cephalosporin use.    ROS: The patient denies anorexia,  fever, vision changes, decreased hearing, ear pain, sore throat, breast concerns, chest pain, palpitations, lightheadedness, syncope, dyspnea on exertion, cough, swelling, nausea, vomiting, diarrhea, constipation, abdominal pain, melena, hematochezia, indigestion/heartburn, hematuria, incontinence, dysuria, vaginal discharge, odor or itch, genital lesions,numbness, tingling, weakness, tremor, suspicious skin lesions, depression, anxiety, abnormal bleeding/bruising, or enlarged lymph nodes.   Some congestion, year-round allergies.  Mucus is currently clear. Headache today--above L eye, possibly related to pain from the stye at the left eye. Stye at L lower eyelid +stress (work-related, very busy).   PHYSICAL EXAM:  BP (!) 150/100   Pulse 76   Ht 5\' 4"  (1.626 m)   Wt (!) 388 lb (176 kg)   BMI 66.60 kg/m   138/82 on repeat by MD  Wt Readings from Last 3 Encounters:  05/19/20 (!) 388 lb (176 kg)  11/12/19 (!) 390 lb (176.9 kg)  10/30/18 (!) 403 lb 3.2 oz (182.9 kg)    General Appearance:  Alert, cooperative, no distress, appears stated age, morbidly obese   Head:  Normocephalic, without obvious abnormality, atraumatic.  Eyes:  PERRL, conjunctiva/corneas clear, EOM's intact, fundi benign. Soft tissue swelling with opening of stye/pustule at the medial aspect of the left lower lid. There is no surrounding erythema, warmth, no swelling into the cheek or upper eyelid, just focally around the medial aspect of the lower lid  Ears:  Normal TM's and external ear canals  Nose:  Not examined, wearing mask due to COVID-19 pandemic  Throat:  Not examined, wearing mask due to COVID-19 pandemic   Neck:  Supple, no lymphadenopathy; thyroid: no enlargement/ tenderness/nodules; no carotid bruit or JVD   Back:  Spine nontender, no curvature, ROM normal, no CVA tenderness   Lungs:  Clear to auscultation bilaterally without wheezes, rales or ronchi; respirations unlabored   Chest Wall:  No  tenderness or deformity   Heart:  Regular rate and rhythm, S1 and S2 normal, no murmur, rub  or gallop   Breast Exam:  No tenderness, masses, or nipple discharge or inversion. No axillary lymphadenopathy   Abdomen:  Soft, non-tender, nondistended, normoactive bowel sounds, no masses, no hepatosplenomegaly. Obese. WHSS low transverse, without rash  Genitalia:  Normal external genitalia.  Bimanual exam was limited due to body habitus.  Nontender, no masses could be appreciated. No cervical motion tenderness. No abnormal discharge.  Pap not performed  Rectal:  Normal sphincter tone, no masses, heme negative stool.  Extremities:  No clubbing, cyanosis or edema.   Pulses:  2+ and symmetric all extremities   Skin:  Skin color, texture, turgor normal, no rashes or lesions. +tattoos   Lymph nodes:  Cervical, supraclavicular, and axillary nodes normal   Neurologic:  CNII-XII intact, normal strength, sensation and gait; reflexes 2+ and symmetric throughout    Psych: Normal mood, affect, hygiene and grooming.    ASSESSMENT/PLAN:  Annual physical exam - Plan: Visual acuity screening, POCT  Urinalysis DIP (Proadvantage Device)  Vitamin D deficiency - cont supplements  Class 3 severe obesity due to excess calories with serious comorbidity and body mass index (BMI) of 60.0 to 69.9 in adult [facility]) - discussed some wt loss options. She will check insurance re: [facility]. Also discussed [employer], calorie trackers (ie MFP)  Need for influenza vaccination - Plan: Flu Vaccine QUAD 6+ mos PF IM (Fluarix Quad PF)  Hordeolum of left lower eyelid, unspecified hordeolum type - has drained and improved some per pt. More to drain--resume warm compresses.  topical erythro ointment. no e/o cellulitis - Plan: erythromycin ophthalmic ointment  Essential hypertension - elevated today, improved on recheck. Reviewed low Na diet, daily exercise, wt loss. Cont monitor at home. Cont current  med - Plan: amLODipine (NORVASC) 10 MG tablet   Discussed monthly self breast exams and yearly mammograms after the age of 70 (given shower card and advised to contact [facility] to schedule mammo); at least 30 minutes of aerobic activity at least 5 days/week, weight-bearing exercise at least 2x/wk; proper sunscreen use reviewed; healthy diet, including goals of calcium and vitamin D intake and alcohol recommendations (less than or equal to 1 drink/day) reviewed; regular seatbelt use; changing batteries in smoke detectors.  Immunization recommendations discussed, flu shot given today.  Colonoscopy recommendations reviewed, age 67.  F/u 6 months, sooner prn.

## 2020-05-18 NOTE — Patient Instructions (Addendum)
HEALTH MAINTENANCE RECOMMENDATIONS:  It is recommended that you get at least 30 minutes of aerobic exercise at least 5 days/week (for weight loss, you may need as much as 60-90 minutes). This can be any activity that gets your heart rate up. This can be divided in 10-15 minute intervals if needed, but try and build up your endurance at least once a week.  Weight bearing exercise is also recommended twice weekly.  Eat a healthy diet with lots of vegetables, fruits and fiber.  "Colorful" foods have a lot of vitamins (ie green vegetables, tomatoes, red peppers, etc).  Limit sweet tea, regular sodas and alcoholic beverages, all of which has a lot of calories and sugar.  Up to 1 alcoholic drink daily may be beneficial for women (unless trying to lose weight, watch sugars).  Drink a lot of water.  Calcium recommendations are 1200-1500 mg daily (1500 mg for postmenopausal women or women without ovaries), and vitamin D 1000 IU daily.  This should be obtained from diet and/or supplements (vitamins), and calcium should not be taken all at once, but in divided doses.  Monthly self breast exams and yearly mammograms for women over the age of 95 is recommended.  Sunscreen of at least SPF 30 should be used on all sun-exposed parts of the skin when outside between the hours of 10 am and 4 pm (not just when at beach or pool, but even with exercise, golf, tennis, and yard work!)  Use a sunscreen that says "broad spectrum" so it covers both UVA and UVB rays, and make sure to reapply every 1-2 hours.  Remember to change the batteries in your smoke detectors when changing your clock times in the spring and fall. Carbon monoxide detectors are recommended for your home.  Use your seat belt every time you are in a car, and please drive safely and not be distracted with cell phones and texting while driving.  Please call and schedule your routine mammogram. They should now be done yearly. Routine eye exam is  recommended.  We discussed Healthy [facility] Weight [facility] Clinic through [facility]. Check to see if this is covered/affordable with your insurance. I do not think that you need a referral, but if this is incorrect, let me know and I can put one in for you. (Dr. Dalbert Garnet, Dr. Rinaldo Ratel, Dr. Manson Passey, etc)  Continue warm compresses to the left eye. As long as it continues to drain, it should get better. Apply the ointment just to the area of the bump three times daily. If you get fever, increasing swelling, redness, pain, you will need to be re-evaluated.  Blood pressure was borderline (very at high at first, but came down). Please continue to monitor at home (goal is 130/80 or less, up to 135/85 is okay). Work on stress reduction, daily exercise, low sodium diet and weight loss to help with keeping your blood pressure down.   DASH Eating Plan DASH stands for "Dietary Approaches to Stop Hypertension." The DASH eating plan is a healthy eating plan that has been shown to reduce high blood pressure (hypertension). It may also reduce your risk for type 2 diabetes, heart disease, and stroke. The DASH eating plan may also help with weight loss. What are tips for following this plan?  General guidelines  Avoid eating more than 2,300 mg (milligrams) of salt (sodium) a day. If you have hypertension, you may need to reduce your sodium intake to 1,500 mg a day.  Limit alcohol intake to  no more than 1 drink a day for nonpregnant women and 2 drinks a day for men. One drink equals 12 oz of beer, 5 oz of wine, or 1 oz of hard liquor.  Work with your health care provider to maintain a healthy body weight or to lose weight. Ask what an ideal weight is for you.  Get at least 30 minutes of exercise that causes your heart to beat faster (aerobic exercise) most days of the week. Activities may include walking, swimming, or biking.  Work with your health care provider or diet and nutrition specialist (dietitian)  to adjust your eating plan to your individual calorie needs. Reading food labels   Check food labels for the amount of sodium per serving. Choose foods with less than 5 percent of the Daily Value of sodium. Generally, foods with less than 300 mg of sodium per serving fit into this eating plan.  To find whole grains, look for the word "whole" as the first word in the ingredient list. Shopping  Buy products labeled as "low-sodium" or "no salt added."  Buy fresh foods. Avoid canned foods and premade or frozen meals. Cooking  Avoid adding salt when cooking. Use salt-free seasonings or herbs instead of table salt or sea salt. Check with your health care provider or pharmacist before using salt substitutes.  Do not fry foods. Cook foods using healthy methods such as baking, boiling, grilling, and broiling instead.  Cook with heart-healthy oils, such as olive, canola, soybean, or sunflower oil. Meal planning  Eat a balanced diet that includes: ? 5 or more servings of fruits and vegetables each day. At each meal, try to fill half of your plate with fruits and vegetables. ? Up to 6-8 servings of whole grains each day. ? Less than 6 oz of lean meat, poultry, or fish each day. A 3-oz serving of meat is about the same size as a deck of cards. One egg equals 1 oz. ? 2 servings of low-fat dairy each day. ? A serving of nuts, seeds, or beans 5 times each week. ? Heart-healthy fats. Healthy fats called Omega-3 fatty acids are found in foods such as flaxseeds and coldwater fish, like sardines, salmon, and mackerel.  Limit how much you eat of the following: ? Canned or prepackaged foods. ? Food that is high in trans fat, such as fried foods. ? Food that is high in saturated fat, such as fatty meat. ? Sweets, desserts, sugary drinks, and other foods with added sugar. ? Full-fat dairy products.  Do not salt foods before eating.  Try to eat at least 2 vegetarian meals each week.  Eat more  home-cooked food and less restaurant, buffet, and fast food.  When eating at a restaurant, ask that your food be prepared with less salt or no salt, if possible. What foods are recommended? The items listed may not be a complete list. Talk with your dietitian about what dietary choices are best for you. Grains Whole-grain or whole-wheat bread. Whole-grain or whole-wheat pasta. Brown rice. Orpah Cobb. Bulgur. Whole-grain and low-sodium cereals. Pita bread. Low-fat, low-sodium crackers. Whole-wheat flour tortillas. Vegetables Fresh or frozen vegetables (raw, steamed, roasted, or grilled). Low-sodium or reduced-sodium tomato and vegetable juice. Low-sodium or reduced-sodium tomato sauce and tomato paste. Low-sodium or reduced-sodium canned vegetables. Fruits All fresh, dried, or frozen fruit. Canned fruit in natural juice (without added sugar). Meat and other protein foods Skinless chicken or [place]. Ground chicken or [place]. Pork with fat trimmed off. Fish and  seafood. Egg whites. Dried beans, peas, or lentils. Unsalted nuts, nut butters, and seeds. Unsalted canned beans. Lean cuts of beef with fat trimmed off. Low-sodium, lean deli meat. Dairy Low-fat (1%) or fat-free (skim) milk. Fat-free, low-fat, or reduced-fat cheeses. Nonfat, low-sodium ricotta or cottage cheese. Low-fat or nonfat yogurt. Low-fat, low-sodium cheese. Fats and oils Soft margarine without trans fats. Vegetable oil. Low-fat, reduced-fat, or light mayonnaise and salad dressings (reduced-sodium). Canola, safflower, olive, soybean, and sunflower oils. Avocado. Seasoning and other foods Herbs. Spices. Seasoning mixes without salt. Unsalted popcorn and pretzels. Fat-free sweets. What foods are not recommended? The items listed may not be a complete list. Talk with your dietitian about what dietary choices are best for you. Grains Baked goods made with fat, such as croissants, muffins, or some breads. Dry pasta or rice meal  packs. Vegetables Creamed or fried vegetables. Vegetables in a cheese sauce. Regular canned vegetables (not low-sodium or reduced-sodium). Regular canned tomato sauce and paste (not low-sodium or reduced-sodium). Regular tomato and vegetable juice (not low-sodium or reduced-sodium). Rosita Fire. Olives. Fruits Canned fruit in a light or heavy syrup. Fried fruit. Fruit in cream or butter sauce. Meat and other protein foods Fatty cuts of meat. Ribs. Fried meat. Tomasa Blase. Sausage. Bologna and other processed lunch meats. Salami. Fatback. Hotdogs. Bratwurst. Salted nuts and seeds. Canned beans with added salt. Canned or smoked fish. Whole eggs or egg yolks. Chicken or [place] with skin. Dairy Whole or 2% milk, cream, and half-and-half. Whole or full-fat cream cheese. Whole-fat or sweetened yogurt. Full-fat cheese. Nondairy creamers. Whipped toppings. Processed cheese and cheese spreads. Fats and oils Butter. Stick margarine. Lard. Shortening. Ghee. Bacon fat. Tropical oils, such as coconut, palm kernel, or palm oil. Seasoning and other foods Salted popcorn and pretzels. Onion salt, garlic salt, seasoned salt, table salt, and sea salt. Worcestershire sauce. Tartar sauce. Barbecue sauce. Teriyaki sauce. Soy sauce, including reduced-sodium. Steak sauce. Canned and packaged gravies. Fish sauce. Oyster sauce. Cocktail sauce. Horseradish that you find on the shelf. Ketchup. Mustard. Meat flavorings and tenderizers. Bouillon cubes. Hot sauce and Tabasco sauce. Premade or packaged marinades. Premade or packaged taco seasonings. Relishes. Regular salad dressings. Where to find more information:  National Heart, Lung, and Blood Institute: PopSteam.is  American Heart Association: www.heart.org Summary  The DASH eating plan is a healthy eating plan that has been shown to reduce high blood pressure (hypertension). It may also reduce your risk for type 2 diabetes, heart disease, and stroke.  With the DASH eating  plan, you should limit salt (sodium) intake to 2,300 mg a day. If you have hypertension, you may need to reduce your sodium intake to 1,500 mg a day.  When on the DASH eating plan, aim to eat more fresh fruits and vegetables, whole grains, lean proteins, low-fat dairy, and heart-healthy fats.  Work with your health care provider or diet and nutrition specialist (dietitian) to adjust your eating plan to your individual calorie needs. This information is not intended to replace advice given to you by your health care provider. Make sure you discuss any questions you have with your health care provider. Document Revised: 07/22/2017 Document Reviewed: 08/02/2016 Elsevier Patient Education  2020 [employer].

## 2020-05-19 ENCOUNTER — Encounter: Payer: Self-pay | Admitting: Family Medicine

## 2020-05-19 ENCOUNTER — Other Ambulatory Visit: Payer: Self-pay

## 2020-05-19 ENCOUNTER — Ambulatory Visit: Payer: 59 | Admitting: Family Medicine

## 2020-05-19 VITALS — BP 138/82 | HR 76 | Ht 64.0 in | Wt 388.0 lb

## 2020-05-19 DIAGNOSIS — H00015 Hordeolum externum left lower eyelid: Secondary | ICD-10-CM | POA: Diagnosis not present

## 2020-05-19 DIAGNOSIS — E559 Vitamin D deficiency, unspecified: Secondary | ICD-10-CM | POA: Diagnosis not present

## 2020-05-19 DIAGNOSIS — Z6841 Body Mass Index (BMI) 40.0 and over, adult: Secondary | ICD-10-CM

## 2020-05-19 DIAGNOSIS — Z23 Encounter for immunization: Secondary | ICD-10-CM | POA: Diagnosis not present

## 2020-05-19 DIAGNOSIS — Z0000 Encounter for general adult medical examination without abnormal findings: Secondary | ICD-10-CM

## 2020-05-19 DIAGNOSIS — I10 Essential (primary) hypertension: Secondary | ICD-10-CM

## 2020-05-19 LAB — POCT URINALYSIS DIP (PROADVANTAGE DEVICE)
Glucose, UA: NEGATIVE mg/dL
Ketones, POC UA: NEGATIVE mg/dL
Leukocytes, UA: NEGATIVE
Nitrite, UA: NEGATIVE
Specific Gravity, Urine: 1.03
Urobilinogen, Ur: NEGATIVE
pH, UA: 5.5 (ref 5.0–8.0)

## 2020-05-19 MED ORDER — ERYTHROMYCIN 5 MG/GM OP OINT: 1.0000 "application " | TOPICAL_OINTMENT | Freq: Three times a day (TID) | OPHTHALMIC | 0 refills | Status: DC

## 2020-05-19 MED ORDER — AMLODIPINE BESYLATE 10 MG PO TABS: 10.0000 mg | ORAL_TABLET | Freq: Every day | ORAL | 1 refills | Status: DC

## 2020-06-27 ENCOUNTER — Other Ambulatory Visit: Payer: Self-pay | Admitting: Family Medicine

## 2020-06-27 DIAGNOSIS — Z1231 Encounter for screening mammogram for malignant neoplasm of breast: Secondary | ICD-10-CM

## 2020-08-06 ENCOUNTER — Ambulatory Visit
Admission: RE | Admit: 2020-08-06 | Discharge: 2020-08-06 | Disposition: A | Discharge status: Home / Self Care | Payer: 59 | Source: Ambulatory Visit | Attending: Family Medicine | Admitting: Family Medicine

## 2020-08-06 ENCOUNTER — Other Ambulatory Visit: Payer: Self-pay

## 2020-08-06 DIAGNOSIS — Z1231 Encounter for screening mammogram for malignant neoplasm of breast: Secondary | ICD-10-CM

## 2020-08-13 ENCOUNTER — Other Ambulatory Visit: Payer: Self-pay | Admitting: Family Medicine

## 2020-08-13 DIAGNOSIS — R928 Other abnormal and inconclusive findings on diagnostic imaging of breast: Secondary | ICD-10-CM

## 2020-08-14 ENCOUNTER — Other Ambulatory Visit: Payer: 59

## 2020-08-28 ENCOUNTER — Other Ambulatory Visit: Payer: Self-pay | Admitting: Family Medicine

## 2020-08-28 ENCOUNTER — Other Ambulatory Visit: Payer: Self-pay

## 2020-08-28 ENCOUNTER — Ambulatory Visit
Admission: RE | Admit: 2020-08-28 | Discharge: 2020-08-28 | Disposition: A | Discharge status: Home / Self Care | Payer: 59 | Source: Ambulatory Visit | Attending: Family Medicine | Admitting: Family Medicine

## 2020-08-28 DIAGNOSIS — R928 Other abnormal and inconclusive findings on diagnostic imaging of breast: Secondary | ICD-10-CM

## 2020-11-16 NOTE — Progress Notes (Signed)
Chief Complaint  Patient presents with  . Hypertension    Fasting med check. Has sinus pain and pressure since having covid 2/1. Also has had fatigue ever since. Also having back spasm-was moving furniture in her living room Saturday. [id number])    Patient presents for 6 month follow-up.  She had COVID last month, for the second time.  She has some persistent fatigue, headaches (between her eyes and at her cheeks--thinks sinus pressure). +runny nose (clear), sneezing, itchy eyes. She took a benadryl once at night, which helped some. She does have h/o allergies.  Hasn't started back on Flonase yet.  She is also complaining of back pain today. She was moving furniture in her living room two days ago, and had acute onset of pain in the middle of her back (just below the bra-line).  No radiation of the pain. No numbness or tingling.   She took Tylenol, which helps some.  Heating pad also helped temporarily. Between her headache and back pain, has 9/10 pain.  Hypertension follow-up: She reports compliance in taking amlodipine 10mg . She denies side effects. (She previously also took HCTZ 25mg  daily, but had been off diuretics and BP's remained okay, so kept off it). BP's are running 130/80's, higher only if in pain. She denies, dizziness, chest pain, palpitations, shortness of breath, edema. +headaches in the last month (since got COVID, sinus headaches).  She also has h/o Vitamin D deficiency. She is compliant with taking 2000 IU of Vitamin D3 daily plus gummy MVI daily. Last check was in 03/2017 level was 29, prior levels had been much lower, when not taking supplements (level of 15 in 2017).  Obesity: weight is up and down, tries to watch what she eats. Walks after work 30 minutes, if she isn't too tired--has been more tired lately, only able to go once a week. (fatigue since COVID, and also allergies flaring now) Having 20 oz of [facility] daily. (290 calories)  At her physical 6 months  ago she reported she would check insurance re: [facility]. Also discussed 2018, calorie trackers (ie MFP). She hasn't yet checked into the insurance coverage.  Fatty liver was noted on RUQ ultrasound done 10/2019. Lab Results  Component Value Date   ALT 64 (H) 10/24/2019   AST 56 (H) 10/24/2019   ALKPHOS 63 10/24/2019   BILITOT 0.6 10/24/2019    PMH, PSH, SH reviewed  Outpatient Encounter Medications as of 11/17/2020  Medication Sig  . cholecalciferol (VITAMIN D) 1000 UNITS tablet Take 2,000 Units at bedtime by mouth.   . fluticasone (FLONASE) 50 MCG/ACT nasal spray Place 2 sprays into both nostrils daily.  . methocarbamol (ROBAXIN) 500 MG tablet Take 1-2 tablets (500-1,000 mg total) by mouth every 6 (six) hours as needed for muscle spasms.  . Multiple Vitamins-Minerals (MULTI ADULT GUMMIES PO) Take 2 each by mouth daily.  . [DISCONTINUED] amLODipine (NORVASC) 10 MG tablet Take 1 tablet (10 mg total) by mouth daily.  12/24/2019 amLODipine (NORVASC) 10 MG tablet Take 1 tablet (10 mg total) by mouth daily.  . [DISCONTINUED] erythromycin ophthalmic ointment Place 1 application into the left eye 3 (three) times daily.  . [EXPIRED] ketorolac (TORADOL) injection 60 mg    No facility-administered encounter medications on file as of 11/17/2020.   Allergies  Allergen Reactions  . Penicillins Hives and Other (See Comments)    Has patient had a PCN reaction causing immediate rash, facial/tongue/throat swelling, SOB or lightheadedness with hypotension: No Has patient had a PCN reaction  causing severe rash involving mucus membranes or skin necrosis: No Has patient had a PCN reaction that required hospitalization No Has patient had a PCN reaction occurring within the last 10 years: No If all of the above answers are "NO", then may proceed with Cephalosporin use.    ROS: no dizziness, chest pain, shortness of breath. No fever, chills, URI symptoms. No nausea, vomiting, bowel changes. No urinary  complaints. RLS remain improved, drinking more water. Intermittent cramps at R flank LBP x 2d per HPI Headaches and allergy symptoms per HPI    PHYSICAL EXAM:  BP (!) 150/100   Pulse 80   Ht 5' 4.5" (1.638 m)   Wt (!) 394 lb 12.8 oz (179.1 kg)   LMP 10/31/2020 (Exact Date)   BMI 66.72 kg/m   140/82 on repeat by MD  Wt Readings from Last 3 Encounters:  11/17/20 (!) 394 lb 12.8 oz (179.1 kg)  05/19/20 (!) 388 lb (176 kg)  11/12/19 (!) 390 lb (176.9 kg)    Well appearing, pleasant, morbidly obese female in no distress. HEENT: PERRL, EOMI, conjunctiva and sclera are clear. TM's and EAC's normal. Nasal mucosa moderately edematous, no mucus.  Sinuses tender x 4. OP is clear Neck:No lymphadenopathyor mass, no carotid bruit Heart: regular rate and rhythm, no murmur Lungs: clear bilaterally, no wheezes, rales, ronchi Back: no spinal tenderness or CVA tenderness. + tender at L paraspinous muscles in lower thoracic and upper llumbar spine. Abdomen: obese, soft, no appreciable mass or bulge. Skin: normal turgor, no rash Extremities: no pitting edema. Psych: normal mood, affect, hygiene and grooming Neuro: alert and oriented, normal gait, strength   ASSESSMENT/PLAN:  Essential hypertension - elevated today, improved on recheck. Reviewed low Na diet, daily exercise, wt loss. Cont monitor at home. Cont current med - Plan: Comprehensive metabolic panel, amLODipine (NORVASC) 10 MG tablet  Vitamin D deficiency - continue daily supplements - Plan: VITAMIN D 25 Hydroxy (Vit-D Deficiency, Fractures)  Fatty liver - reviewed risks of NAFLD, encouraged wt loss - Plan: Comprehensive metabolic panel  Fatigue, unspecified type - Plan: Comprehensive metabolic panel, CBC with Differential/Platelet, VITAMIN D 25 Hydroxy (Vit-D Deficiency, Fractures), TSH  Weight gain - Plan: TSH  Muscle spasm of back - heat, massage, muscle relaxant prn, NSAID prn - Plan: methocarbamol (ROBAXIN) 500 MG  tablet, ketorolac (TORADOL) injection 60 mg  Seasonal allergic rhinitis, unspecified trigger - discussed antihistamines, inhaled steroids - Plan: fluticasone (FLONASE) 50 MCG/ACT nasal spray  Class 3 severe obesity due to excess calories with serious comorbidity and body mass index (BMI) of 60.0 to 69.9 in adult [facility]) - comorbidities include HTN, foot arthritis, fatty liver, LBP. Counseled re: diet, exercise, encouraged to d/c regular soda    Stay well hydrated.  Drink lots of water. Start allergy medications--we are sending in a prescription for the generic for Flonase.  If this is too expensive, then start with antihistamines alone, such as claritin or allegra or zyrtec. Take these once daily. If taking Flonase, take it every day (works preventatively and takes a week to see the full effect. Be sure to take the antihistamines along with it, until it becomes fully effective).  I recommend using sinus rinses or neti-pot to help reduce your sinus pain. If you end up getting any thick mucus, you can use guaifenesin (mucinex) to help loosen it). Avoid decongestants due to your high blood pressure.  Your back pain is muscular (you pulled a muscle). Heat, stretch and massages will help. Take methocarbamol (muscle  relaxant) only if needed for severe pain or spasm. You may also use ibuprofen (3 tablets together= 600mg  of ibuprofen) three times daily with food.  We gave you an injection today--you need to wait 6 hours after the injection before you take any ibuprofen. You may use Tylenol, if needed.

## 2020-11-17 ENCOUNTER — Other Ambulatory Visit: Payer: Self-pay

## 2020-11-17 ENCOUNTER — Encounter: Payer: Self-pay | Admitting: Family Medicine

## 2020-11-17 ENCOUNTER — Ambulatory Visit (INDEPENDENT_AMBULATORY_CARE_PROVIDER_SITE_OTHER): Payer: 59 | Admitting: Family Medicine

## 2020-11-17 VITALS — BP 140/82 | HR 80 | Ht 64.5 in | Wt 394.8 lb

## 2020-11-17 DIAGNOSIS — K760 Fatty (change of) liver, not elsewhere classified: Secondary | ICD-10-CM

## 2020-11-17 DIAGNOSIS — M62830 Muscle spasm of back: Secondary | ICD-10-CM

## 2020-11-17 DIAGNOSIS — R5383 Other fatigue: Secondary | ICD-10-CM | POA: Diagnosis not present

## 2020-11-17 DIAGNOSIS — Z6841 Body Mass Index (BMI) 40.0 and over, adult: Secondary | ICD-10-CM | POA: Insufficient documentation

## 2020-11-17 DIAGNOSIS — E559 Vitamin D deficiency, unspecified: Secondary | ICD-10-CM

## 2020-11-17 DIAGNOSIS — I10 Essential (primary) hypertension: Secondary | ICD-10-CM

## 2020-11-17 DIAGNOSIS — J302 Other seasonal allergic rhinitis: Secondary | ICD-10-CM

## 2020-11-17 DIAGNOSIS — E66813 Obesity, class 3: Secondary | ICD-10-CM | POA: Insufficient documentation

## 2020-11-17 DIAGNOSIS — R635 Abnormal weight gain: Secondary | ICD-10-CM

## 2020-11-17 MED ORDER — FLUTICASONE PROPIONATE 50 MCG/ACT NA SUSP: 2.0000 | Freq: Every day | NASAL | 6 refills | Status: DC

## 2020-11-17 MED ORDER — AMLODIPINE BESYLATE 10 MG PO TABS: 10.0000 mg | ORAL_TABLET | Freq: Every day | ORAL | 5 refills | Status: DC

## 2020-11-17 MED ORDER — METHOCARBAMOL 500 MG PO TABS: 500.0000 mg | ORAL_TABLET | Freq: Four times a day (QID) | ORAL | 0 refills | Status: DC | PRN

## 2020-11-17 MED ORDER — KETOROLAC TROMETHAMINE 60 MG/2ML IM SOLN
60.0000 mg | Freq: Once | INTRAMUSCULAR | Status: AC
  Administered 2020-11-17: 60 mg via INTRAMUSCULAR

## 2020-11-17 NOTE — Patient Instructions (Addendum)
Stay well hydrated.  Drink lots of water. Start allergy medications--we are sending in a prescription for the generic for Flonase.  If this is too expensive, then start with antihistamines alone, such as claritin or allegra or zyrtec. Take these once daily. If taking Flonase, take it every day (works preventatively and takes a week to see the full effect. Be sure to take the antihistamines along with it, until it becomes fully effective).  I recommend using sinus rinses or neti-pot to help reduce your sinus pain. If you end up getting any thick mucus, you can use guaifenesin (mucinex) to help loosen it). Avoid decongestants due to your high blood pressure.  Your back pain is muscular (you pulled a muscle). Heat, stretch and massages will help. Take methocarbamol (muscle relaxant) only if needed for severe pain or spasm. You may also use ibuprofen (3 tablets together= 600mg  of ibuprofen) three times daily with food.  We gave you an injection today--you need to wait 6 hours after the injection before you take any ibuprofen. You may use Tylenol, if needed.   Please cut back on regular soda intake. Consider Diet Dr. or Coke Zero or other diet sodas in place of [facility]. Consider other caffeine sources, and use Steevia.  Check with your insurance regarding coverage/cost for Healthy Weight and Weight Loss clinic, vs whether or not they offer other options to assist with weight loss. If not, consider a free calorie tracking app such as MyFitnessPal

## 2020-11-18 LAB — COMPREHENSIVE METABOLIC PANEL
ALT: 16 IU/L (ref 0–32)
AST: 17 IU/L (ref 0–40)
Albumin/Globulin Ratio: 1.8 (ref 1.2–2.2)
Albumin: 4.2 g/dL (ref 3.8–4.8)
Alkaline Phosphatase: 74 IU/L (ref 44–121)
BUN/Creatinine Ratio: 18 (ref 9–23)
BUN: 11 mg/dL (ref 6–24)
Bilirubin Total: 0.2 mg/dL (ref 0.0–1.2)
CO2: 23 mmol/L (ref 20–29)
Calcium: 9.4 mg/dL (ref 8.7–10.2)
Chloride: 103 mmol/L (ref 96–106)
Creatinine, Ser: 0.62 mg/dL (ref 0.57–1.00)
Globulin, Total: 2.4 g/dL (ref 1.5–4.5)
Glucose: 91 mg/dL (ref 65–99)
Potassium: 4.5 mmol/L (ref 3.5–5.2)
Sodium: 139 mmol/L (ref 134–144)
Total Protein: 6.6 g/dL (ref 6.0–8.5)
eGFR: 115 mL/min/{1.73_m2} (ref >59)

## 2020-11-18 LAB — CBC WITH DIFFERENTIAL/PLATELET
Basophils Absolute: 0 10*3/uL (ref 0.0–0.2)
Basos: 1 %
EOS (ABSOLUTE): 0.1 10*3/uL (ref 0.0–0.4)
Eos: 1 %
Hematocrit: 37.2 % (ref 34.0–46.6)
Hemoglobin: 12.3 g/dL (ref 11.1–15.9)
Immature Grans (Abs): 0 10*3/uL (ref 0.0–0.1)
Immature Granulocytes: 1 %
Lymphocytes Absolute: 2.6 10*3/uL (ref 0.7–3.1)
Lymphs: 40 %
MCH: 23.7 pg — ABNORMAL LOW (ref 26.6–33.0)
MCHC: 33.1 g/dL (ref 31.5–35.7)
MCV: 72 fL — ABNORMAL LOW (ref 79–97)
Monocytes Absolute: 0.4 10*3/uL (ref 0.1–0.9)
Monocytes: 7 %
Neutrophils Absolute: 3.3 10*3/uL (ref 1.4–7.0)
Neutrophils: 50 %
Platelets: 324 10*3/uL (ref 150–450)
RBC: 5.19 x10E6/uL (ref 3.77–5.28)
RDW: 16.1 % — ABNORMAL HIGH (ref 11.7–15.4)
WBC: 6.6 10*3/uL (ref 3.4–10.8)

## 2020-11-18 LAB — VITAMIN D 25 HYDROXY (VIT D DEFICIENCY, FRACTURES): Vit D, 25-Hydroxy: 19.2 ng/mL — ABNORMAL LOW (ref 30.0–100.0)

## 2020-11-18 LAB — TSH: TSH: 2.78 u[IU]/mL (ref 0.450–4.500)

## 2020-11-18 MED ORDER — VITAMIN D (ERGOCALCIFEROL) 1.25 MG (50000 UNIT) PO CAPS: 50000.0000 [IU] | ORAL_CAPSULE | ORAL | 0 refills | Status: DC

## 2020-11-18 NOTE — Addendum Note (Signed)
Addended by: Joselyn Arrow on: 11/18/2020 08:30 AM   Modules accepted: Orders

## 2020-12-17 ENCOUNTER — Other Ambulatory Visit: Payer: Self-pay | Admitting: Family Medicine

## 2020-12-17 DIAGNOSIS — M62830 Muscle spasm of back: Secondary | ICD-10-CM

## 2020-12-18 ENCOUNTER — Other Ambulatory Visit: Payer: Self-pay | Admitting: Family Medicine

## 2020-12-18 DIAGNOSIS — E559 Vitamin D deficiency, unspecified: Secondary | ICD-10-CM

## 2020-12-18 NOTE — Telephone Encounter (Signed)
Patient did request this refill, she said she is still having lower back spasms.

## 2020-12-18 NOTE — Telephone Encounter (Signed)
Patient advised.

## 2020-12-18 NOTE — Telephone Encounter (Signed)
Refill sent.  If still having pain, she should get referred for PT (no additional refills will be sent.)

## 2021-02-18 ENCOUNTER — Encounter: Payer: Self-pay | Admitting: Family Medicine

## 2021-02-18 ENCOUNTER — Ambulatory Visit (INDEPENDENT_AMBULATORY_CARE_PROVIDER_SITE_OTHER): Payer: 59 | Admitting: Family Medicine

## 2021-02-18 ENCOUNTER — Other Ambulatory Visit: Payer: Self-pay

## 2021-02-18 VITALS — BP 130/84 | HR 76 | Temp 97.9°F | Ht 64.0 in | Wt 398.4 lb

## 2021-02-18 DIAGNOSIS — M62838 Other muscle spasm: Secondary | ICD-10-CM

## 2021-02-18 DIAGNOSIS — Z23 Encounter for immunization: Secondary | ICD-10-CM | POA: Diagnosis not present

## 2021-02-18 MED ORDER — MELOXICAM 15 MG PO TABS: 15.0000 mg | ORAL_TABLET | Freq: Every day | ORAL | 0 refills | Status: DC

## 2021-02-18 MED ORDER — KETOROLAC TROMETHAMINE 60 MG/2ML IM SOLN
60.0000 mg | Freq: Once | INTRAMUSCULAR | Status: AC
  Administered 2021-02-18: 60 mg via INTRAMUSCULAR

## 2021-02-18 MED ORDER — METHOCARBAMOL 500 MG PO TABS: ORAL_TABLET | ORAL | 0 refills | Status: DC

## 2021-02-18 NOTE — Patient Instructions (Signed)
  Take meloxicam once daily with food.  You need to wait 6 hours after today's injection before taking this medication. You may NOT take any ibuprofen/advil/motrin/aleve/naproxen/Goody or BC powder while taking the meloxicam. You CAN take acetaminophen/Tylenol products along with the meloxicam, if needed for pain. Heating pad, stretches and massage will help. You may use topical medications such as biofreeze, or SalonPas with lidocaine or others are fine to use.

## 2021-02-18 NOTE — Progress Notes (Signed)
Chief Complaint  Patient presents with   Shoulder Pain    Left shoulder and neck area, goes around the back of her neck and comes around to her right shoulder as well. Started about 3 days ago. Tried heat, helps some. She describes it like a nagging toothache.    other    Patient needs referral faxed to [facility] care for routine eye exam. Is this ok?    Pain started 3 days ago, while mopping the floor.  Pain is at the left neck/shoulder and into the front of the chest.  She also has some pain at the back of the right side of the neck/shoulder.  No numbness, tingling, weakness or radiation of the pain.    Tylenol didn't help. Ibuprofen and heat helped some. Pain wakes her up out of her sleep.  She has been taking 600mg  of ibuprofen three times daily, and doesn't seem to help enough for the pain. She hasn't had any ibuprofen yet today.  Used robaxin for muscle spasms in back--those resolved. She no longer has any left. Tolerated this well, without sedation  PMH, PSH, SH reviewed  Outpatient Encounter Medications as of 02/18/2021  Medication Sig   amLODipine (NORVASC) 10 MG tablet Take 1 tablet (10 mg total) by mouth daily.   cholecalciferol (VITAMIN D) 1000 UNITS tablet Take 2,000 Units at bedtime by mouth.    fluticasone (FLONASE) 50 MCG/ACT nasal spray Place 2 sprays into both nostrils daily.   Multiple Vitamins-Minerals (MULTI ADULT GUMMIES PO) Take 2 each by mouth daily.   methocarbamol (ROBAXIN) 500 MG tablet TAKE 1 TO 2 TABLETS BY MOUTH EVERY 6 HOURS AS NEEDED FOR MUSCLE SPASMS (Patient not taking: Reported on 02/18/2021)   [DISCONTINUED] Vitamin D, Ergocalciferol, (DRISDOL) 1.25 MG (50000 UNIT) CAPS capsule Take 1 capsule (50,000 Units total) by mouth every 7 (seven) days.   No facility-administered encounter medications on file as of 02/18/2021.   Allergies  Allergen Reactions   Penicillins Hives and Other (See Comments)    Has patient had a PCN reaction causing immediate  rash, facial/tongue/throat swelling, SOB or lightheadedness with hypotension: No Has patient had a PCN reaction causing severe rash involving mucus membranes or skin necrosis: No Has patient had a PCN reaction that required hospitalization No Has patient had a PCN reaction occurring within the last 10 years: No If all of the above answers are "NO", then may proceed with Cephalosporin use.    ROS: no fever, chills, URI symptoms. No chest pain, shortness of breath, n/v/d, bleeding, bruises, rashes.  Denies any GI complaints, abdominal pain. No numbness, tingling, weakness, headaches. See HPI   PHYSICAL EXAM:  BP 130/84   Pulse 76   Temp 97.9 F (36.6 C) (Tympanic)   Ht 5\' 4"  (1.626 m)   Wt (!) 398 lb 6.4 oz (180.7 kg)   LMP 02/03/2021 (Approximate)   BMI 68.39 kg/m   Pleasant female, in moderate discomfort related to neck/shoulder pain. HEENT; conjunctiva and sclera are clear, EOMI, wearing mask Neck: no lymphadenopathy or mass.  No spinal tenderness. She is tender at paraspinous muscles in neck bilaterally. FROM of neck Chest: she is tender just below the clavicle at L chest. No bony tenderness. Pain with movement of her left arm (in her left chest, anterior shoulder). Neuro: normal strength, DTR's are symmetric. She is alert and oriented, normal gait. Psych: normal mood, affect, hygiene and grooming  ASSESSMENT/PLAN:  Muscle spasms of neck - posterior, and also upper pectoralis. Heat, stretches  shown, massage. NSAID and robaxin. Risks/SE reviewed.  - Plan: methocarbamol (ROBAXIN) 500 MG tablet, meloxicam (MOBIC) 15 MG tablet, ketorolac (TORADOL) injection 60 mg  Need for COVID-19 vaccine - Plan: Moderna Covid-19 Booster   Toradol 60mg  IM given. To wait 6 hours prior to starting meloxicam. RF robaxin Off work tomorrow (already completed work today).   Ok for referral for eye exam   Take meloxicam once daily with food.  You need to wait 6 hours after today's injection  before taking this medication. You may NOT take any ibuprofen/advil/motrin/aleve/naproxen/Goody or BC powder while taking the meloxicam. You CAN take acetaminophen/Tylenol products along with the meloxicam, if needed for pain. Heating pad, stretches and massage will help. You may use topical medications such as biofreeze, or SalonPas with lidocaine or others are fine to use.

## 2021-03-02 ENCOUNTER — Other Ambulatory Visit: Payer: Self-pay | Admitting: Family Medicine

## 2021-03-02 ENCOUNTER — Other Ambulatory Visit: Payer: Self-pay

## 2021-03-02 ENCOUNTER — Ambulatory Visit
Admission: RE | Admit: 2021-03-02 | Discharge: 2021-03-02 | Disposition: A | Discharge status: Home / Self Care | Payer: 59 | Source: Ambulatory Visit | Attending: Family Medicine | Admitting: Family Medicine

## 2021-03-02 DIAGNOSIS — R928 Other abnormal and inconclusive findings on diagnostic imaging of breast: Secondary | ICD-10-CM

## 2021-03-02 DIAGNOSIS — M62838 Other muscle spasm: Secondary | ICD-10-CM

## 2021-03-25 ENCOUNTER — Ambulatory Visit: Payer: 59 | Admitting: Family Medicine

## 2021-05-08 ENCOUNTER — Other Ambulatory Visit: Payer: Self-pay | Admitting: Family Medicine

## 2021-05-08 DIAGNOSIS — M62838 Other muscle spasm: Secondary | ICD-10-CM

## 2021-05-08 NOTE — Telephone Encounter (Signed)
[facility] is requesting to fill pt Robaxin. Please advise [facility]

## 2021-05-23 DIAGNOSIS — A5901 Trichomonal vulvovaginitis: Secondary | ICD-10-CM

## 2021-05-23 HISTORY — DX: Trichomonal vulvovaginitis: A59.01

## 2021-05-24 NOTE — Progress Notes (Signed)
Chief Complaint  Patient presents with   Annual Exam    Fasting annul exam, cycle started Friday and would like to come back for pap. Left knee and hip hurt.     Marissa Lowe is a 40 y.o. female who presents for a complete physical.   She has the following concerns:  Complaining of L hip and L knee pain.  The knee feels "tight".  No known injury.  It hurts more with cold weather, she wonders if it is arthritis.  The pain at the hip is lateral.  Denies pain at the groin.  Sometimes she notices the pain with walking, gets a "catch" (at both hip and knee), no giving way.  She is on her cycle today, cannot have her pap smear.  She has a lot of cramping the first 3 days of her cycle, in a lot of discomfort today.  Pamprin usually works, hasn't taken anything today due to fasting for visit.  Hypertension follow-up:  She reports compliance in taking amlodipine 10mg . She denies side effects. (She previously also took HCTZ 25mg  daily, but had been off diuretics and BP's remained okay, so kept off it). BP's at home are running 120-135/82 (max of 89). She expected it to be high today due to her pain. She denies headaches, dizziness, chest pain, palpitations, shortness of breath, edema.  h/o Vitamin D deficiency.  Last level was low at 19.2 in 10/2020. At that time she reported compliance in taking 2000 IU of Vitamin D3 daily plus gummy MVI daily (containing 1000 IU).  She was treated with 12 weeks of prescription weekly therapy, and since completing has resumed taking a total of 3000 IU daily.  Obesity:  She has cut back on carbs, eating more fruits and vegetables.  She has lost 12# since June.  Walks after work 30 minutes, if she isn't too tired (usually just 2x/week). She only rarely drinks a soda (prev had regular Mtn Dew daily), mostly drinking water, some juice.  Doesn't like the after taste of diet sodas.    Fatty liver was noted on RUQ ultrasound done 10/2019.  At that time LFT's were also  elevated.  Most recent LFT's were normal. Lab Results  Component Value Date   ALT 16 11/17/2020   AST 17 11/17/2020   ALKPHOS 74 11/17/2020   BILITOT 0.2 11/17/2020     Immunization History  Administered Date(s) Administered   Influenza,inj,Quad PF,6+ Mos 05/09/2014, 05/16/2016, 06/29/2017, 05/19/2020   Influenza-Unspecified 05/24/2015, 05/17/2019   Moderna SARS-COV2 Booster Vaccination 02/18/2021   Moderna Sars-Covid-2 Vaccination 01/15/2020, 02/12/2020   Tdap 03/01/2013, 05/15/2016   h/o varicella in the past, + titer in 10/2015 with prenatal labs Last Pap smear: 10/2015 (during pregnancy), normal, no high risk HPV  (+trich, BV and yeast) She had unprotected sex with a partner (former boyfriend) since last physical.   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  F/u scheduled for 08/2021 Last colonoscopy: never   Last DEXA: never   Dentist: twice yearly   Ophtho: never Exercise: Walks 30 minutes after work 2 days/week. Not currently getting much weight-bearing exercise (doesn't lift her son, no much heavy lifting on the job anymore).   Lipid screen: Lab Results  Component Value Date   CHOL 179 11/12/2019   HDL 55 11/12/2019   LDLCALC 112 (H) 11/12/2019   TRIG 64 11/12/2019   CHOLHDL 3.3 11/12/2019    PMH, PSH, SH and FH were reviewed and updated  Outpatient Encounter Medications as of 05/25/2021  Medication Sig   amLODipine (NORVASC) 10 MG tablet Take 1 tablet (10 mg total) by mouth daily.   cholecalciferol (VITAMIN D) 1000 UNITS tablet Take 2,000 Units at bedtime by mouth.    fluticasone (FLONASE) 50 MCG/ACT nasal spray Place 2 sprays into both nostrils daily.   Multiple Vitamins-Minerals (MULTI ADULT GUMMIES PO) Take 2 each by mouth daily.   meloxicam (MOBIC) 15 MG tablet Take 1 tablet (15 mg total) by mouth daily. Take daily with food, until pain has resolved (Patient not taking: Reported on 05/25/2021)   methocarbamol (ROBAXIN) 500 MG  tablet TAKE 1 TO 2 TABLETS BY MOUTH EVERY 6 HOURS AS NEEDED FOR MUSCLE SPASMS (Patient not taking: Reported on 05/25/2021)   No facility-administered encounter medications on file as of 05/25/2021.   Allergies  Allergen Reactions   Penicillins Hives and Other (See Comments)    Has patient had a PCN reaction causing immediate rash, facial/tongue/throat swelling, SOB or lightheadedness with hypotension: No Has patient had a PCN reaction causing severe rash involving mucus membranes or skin necrosis: No Has patient had a PCN reaction that required hospitalization No Has patient had a PCN reaction occurring within the last 10 years: No If all of the above answers are "NO", then may proceed with Cephalosporin use.    ROS: The patient denies anorexia, fever, vision changes, decreased hearing, ear pain, sore throat, breast concerns, chest pain, palpitations, lightheadedness, syncope, dyspnea on exertion, cough, swelling, nausea, vomiting, diarrhea, constipation, abdominal pain, melena, hematochezia, indigestion/heartburn, hematuria, incontinence, dysuria, vaginal discharge, odor or itch, genital lesions,numbness, tingling, weakness, tremor, suspicious skin lesions, depression, anxiety, abnormal bleeding/bruising, or enlarged lymph nodes.   +menstrual cramps currently. L knee and hip pain per HPI. Some congestion, year-round allergies. This is controlled by Flonase. 12# weight loss since June Mild PMDD symptoms currently.  PHYSICAL EXAM:  BP (!) 144/100   Pulse 72   Ht 5\' 4"  (1.626 m)   Wt (!) 386 lb 12.8 oz (175.5 kg)   LMP 05/22/2021   BMI 66.39 kg/m   Vision Screening   Right eye Left eye Both eyes  Without correction 20 30 20 25 20 30   With correction       Wt Readings from Last 3 Encounters:  05/25/21 (!) 386 lb 12.8 oz (175.5 kg)  02/18/21 (!) 398 lb 6.4 oz (180.7 kg)  11/17/20 (!) 394 lb 12.8 oz (179.1 kg)    General Appearance:   Alert, cooperative, no distress, appears  stated age, morbidly obese. She is in visible discomfort from menstrual cramps    Head:   Normocephalic, without obvious abnormality, atraumatic.  Eyes:   PERRL, conjunctiva/corneas clear, EOM's intact, fundi benign.  Ears:   Normal TM's and external ear canals  Nose:   Not examined, wearing mask due to COVID-19 pandemic  Throat:   Not examined, wearing mask due to COVID-19 pandemic    Neck:   Supple, no lymphadenopathy; thyroid: no enlargement/ tenderness/nodules; no carotid bruit or JVD    Back:   Spine nontender, no curvature, ROM normal, no CVA tenderness    Lungs:   Clear to auscultation bilaterally without wheezes, rales or ronchi; respirations unlabored    Chest Wall:   No tenderness or deformity    Heart:   Regular rate and rhythm, S1 and S2 normal, no murmur, rub or gallop    Breast Exam:   Not examined today (pt declined to get undressed)  Abdomen:   Soft,  nondistended, normoactive bowel sounds, no masses, no hepatosplenomegaly. Obese. Mildly tender at LLQ, no rebound or guarding  Genitalia:   Deferred due to heavy menstrual cycle  Rectal:   Exam deferred today  Extremities:   No clubbing, cyanosis or edema. small nontender ganglion at top of R foot. No pain at patella, nontender to palpation.  Not undressed, could not perform full knee eval.  She had pain at lateral hip with external rotation, tender along muscle/tendon at lateralmost portion of groin. Nontender at trochanteric bursa  Pulses:   2+ and symmetric all extremities    Skin:   Skin color, texture, turgor normal, no rashes or lesions. +tattoos   Lymph nodes:   Cervical, supraclavicular, and axillary nodes normal    Neurologic:   Normal strength, sensation and gait; reflexes 2+ and symmetric throughout                         Psych:   Normal mood, affect, hygiene and grooming.   Depression screen [facility] 2/9 05/25/2021 05/25/2021 11/17/2020 05/19/2020 03/28/2017  Decreased Interest 1 1 1  0 0  Down, Depressed, Hopeless 1 1 1  0 0   PHQ - 2 Score 2 2 2  0 0  Altered sleeping 0 - - - -  Tired, decreased energy 1 - - - -  Change in appetite 0 - - - -  Feeling bad or failure about yourself  0 - - - -  Trouble concentrating 0 - - - -  Moving slowly or fidgety/restless 0 - - - -  Suicidal thoughts 0 - - - -  PHQ-9 Score 3 - - - -   She feels like her mild depressive symptoms are specifically related to her current menstrual cycle, tends to have mood changes with her cycles. Denies depression any other time, not interested in treatment/discussion.   ASSESSMENT/PLAN:  Annual physical exam - Plan: Visual acuity screening, VITAMIN D 25 Hydroxy (Vit-D Deficiency, Fractures), Comprehensive metabolic panel, Lipid panel  Vitamin D deficiency - Due for recheck; may need to increase OTC dose - Plan: VITAMIN D 25 Hydroxy (Vit-D Deficiency, Fractures)  Fatty liver - congratulated on her weight loss so far.  Last LFT's normal, recheck today - Plan: Comprehensive metabolic panel  Seasonal allergic rhinitis, unspecified trigger - controlled with flonase  Need for influenza vaccination - Plan: Flu Vaccine QUAD 6+ mos PF IM (Fluarix Quad PF)  Need for COVID-19 vaccine - Plan: Moderna Covid-19 Vaccine Bivalent Booster  Screen for STD (sexually transmitted disease) - encouraged regular condom use to prevent STD's.  Will check for GC/chlamydia when she returns next week (on cycle, didn't provide urine) - Plan: RPR, HIV Antibody (routine testing w rflx)  Unprotected sex - Encouraged regular condom use - Plan: RPR, HIV Antibody (routine testing w rflx)  Essential hypertension - elevated today, in obvious discomfort from menstrual cramps.  Lower at home.  Will recheck at visit next week. Cont amlodipine - Plan: Comprehensive metabolic panel, amLODipine (NORVASC) 10 MG tablet  Left hip pain - shown stretches, recommend heat/massage. Consider x-rays of knee/hip if not improving.  Acute pain of left knee - suspect component of OA. Cont  weight loss. Rec icing, topical meds, NSAIDs prn. Compression with walking   Return in 1-2 weeks for breast exam, pelvic, pap smear.  Can also look at L knee. Will need GC/chlamydia.  Discussed monthly self breast exams and yearly mammograms; at least 30 minutes of aerobic activity at least  5 days/week, weight-bearing exercise at least 2x/wk; proper sunscreen use reviewed; healthy diet, including goals of calcium and vitamin D intake and alcohol recommendations (less than or equal to 1 drink/day) reviewed; regular seatbelt use; changing batteries in smoke detectors.  Immunization recommendations discussed, flu shot and bivalent COVID vaccine given today.  Colonoscopy recommendations reviewed, age 72.

## 2021-05-24 NOTE — Patient Instructions (Addendum)
  HEALTH MAINTENANCE RECOMMENDATIONS:  It is recommended that you get at least 30 minutes of aerobic exercise at least 5 days/week (for weight loss, you may need as much as 60-90 minutes). This can be any activity that gets your heart rate up. This can be divided in 10-15 minute intervals if needed, but try and build up your endurance at least once a week.  Weight bearing exercise is also recommended twice weekly.  Eat a healthy diet with lots of vegetables, fruits and fiber.  "Colorful" foods have a lot of vitamins (ie green vegetables, tomatoes, red peppers, etc).  Limit sweet tea, regular sodas and alcoholic beverages, all of which has a lot of calories and sugar.  Up to 1 alcoholic drink daily may be beneficial for women (unless trying to lose weight, watch sugars).  Drink a lot of water.  Calcium recommendations are 1200-1500 mg daily (1500 mg for postmenopausal women or women without ovaries), and vitamin D 1000 IU daily.  This should be obtained from diet and/or supplements (vitamins), and calcium should not be taken all at once, but in divided doses.  Monthly self breast exams and yearly mammograms for women over the age of 72 is recommended.  Sunscreen of at least SPF 30 should be used on all sun-exposed parts of the skin when outside between the hours of 10 am and 4 pm (not just when at beach or pool, but even with exercise, golf, tennis, and yard work!)  Use a sunscreen that says "broad spectrum" so it covers both UVA and UVB rays, and make sure to reapply every 1-2 hours.  Remember to change the batteries in your smoke detectors when changing your clock times in the spring and fall. Carbon monoxide detectors are recommended for your home.  Use your seat belt every time you are in a car, and please drive safely and not be distracted with cell phones and texting while driving.  Routine eye exam is recommended.  You can try a compression sleeve at the knee while walking. Do some good  stretches after walking (as shown, especially the IT band stretches--crossing your legs and touching your toes while standing). Icing after exercise, and topical medications are fine.  You may use tylenol or ibuprofen if needed. Heat, massage and stretches should help the left hip.

## 2021-05-25 ENCOUNTER — Other Ambulatory Visit: Payer: Self-pay

## 2021-05-25 ENCOUNTER — Encounter: Payer: Self-pay | Admitting: Family Medicine

## 2021-05-25 ENCOUNTER — Ambulatory Visit (INDEPENDENT_AMBULATORY_CARE_PROVIDER_SITE_OTHER): Payer: 59 | Admitting: Family Medicine

## 2021-05-25 VITALS — BP 144/100 | HR 72 | Ht 64.0 in | Wt 386.8 lb

## 2021-05-25 DIAGNOSIS — I10 Essential (primary) hypertension: Secondary | ICD-10-CM

## 2021-05-25 DIAGNOSIS — E559 Vitamin D deficiency, unspecified: Secondary | ICD-10-CM

## 2021-05-25 DIAGNOSIS — Z113 Encounter for screening for infections with a predominantly sexual mode of transmission: Secondary | ICD-10-CM

## 2021-05-25 DIAGNOSIS — Z0000 Encounter for general adult medical examination without abnormal findings: Secondary | ICD-10-CM | POA: Diagnosis not present

## 2021-05-25 DIAGNOSIS — M25552 Pain in left hip: Secondary | ICD-10-CM

## 2021-05-25 DIAGNOSIS — Z23 Encounter for immunization: Secondary | ICD-10-CM | POA: Diagnosis not present

## 2021-05-25 DIAGNOSIS — J302 Other seasonal allergic rhinitis: Secondary | ICD-10-CM | POA: Diagnosis not present

## 2021-05-25 DIAGNOSIS — M25562 Pain in left knee: Secondary | ICD-10-CM

## 2021-05-25 DIAGNOSIS — K760 Fatty (change of) liver, not elsewhere classified: Secondary | ICD-10-CM | POA: Diagnosis not present

## 2021-05-25 DIAGNOSIS — Z7251 High risk heterosexual behavior: Secondary | ICD-10-CM

## 2021-05-25 MED ORDER — AMLODIPINE BESYLATE 10 MG PO TABS: 10.0000 mg | ORAL_TABLET | Freq: Every day | ORAL | 5 refills | Status: DC

## 2021-05-26 LAB — COMPREHENSIVE METABOLIC PANEL
ALT: 9 IU/L (ref 0–32)
AST: 9 IU/L (ref 0–40)
Albumin/Globulin Ratio: 1.4 (ref 1.2–2.2)
Albumin: 4 g/dL (ref 3.8–4.8)
Alkaline Phosphatase: 74 IU/L (ref 44–121)
BUN/Creatinine Ratio: 14 (ref 9–23)
BUN: 10 mg/dL (ref 6–24)
Bilirubin Total: 0.3 mg/dL (ref 0.0–1.2)
CO2: 23 mmol/L (ref 20–29)
Calcium: 9.1 mg/dL (ref 8.7–10.2)
Chloride: 105 mmol/L (ref 96–106)
Creatinine, Ser: 0.74 mg/dL (ref 0.57–1.00)
Globulin, Total: 2.9 g/dL (ref 1.5–4.5)
Glucose: 85 mg/dL (ref 70–99)
Potassium: 4 mmol/L (ref 3.5–5.2)
Sodium: 144 mmol/L (ref 134–144)
Total Protein: 6.9 g/dL (ref 6.0–8.5)
eGFR: 104 mL/min/{1.73_m2} (ref >59)

## 2021-05-26 LAB — LIPID PANEL
Chol/HDL Ratio: 3.6 ratio (ref 0.0–4.4)
Cholesterol, Total: 179 mg/dL (ref 100–199)
HDL: 50 mg/dL (ref >39)
LDL Chol Calc (NIH): 112 mg/dL — ABNORMAL HIGH (ref 0–99)
Triglycerides: 95 mg/dL (ref 0–149)
VLDL Cholesterol Cal: 17 mg/dL (ref 5–40)

## 2021-05-26 LAB — RPR: RPR Ser Ql: NONREACTIVE

## 2021-05-26 LAB — VITAMIN D 25 HYDROXY (VIT D DEFICIENCY, FRACTURES): Vit D, 25-Hydroxy: 25.3 ng/mL — ABNORMAL LOW (ref 30.0–100.0)

## 2021-05-26 LAB — HIV ANTIBODY (ROUTINE TESTING W REFLEX): HIV Screen 4th Generation wRfx: NONREACTIVE

## 2021-06-07 NOTE — Progress Notes (Signed)
Chief Complaint  Patient presents with   Gynecologic Exam    Breast and pelvic exam. Called cytology and we can still check GC/CH on pap, just not BV. Her left knee still bothering her, feels tight.     Patient presents for breast and pelvic exam, as she was on her cycle at the time of her physical earlier this month. She had reported unprotected sex, had bloodwork done, but is due to have GC/chlamydia checked today. Her blood pressure had been high at that visit, but felt to be related to her painful cramps she was having at the time of her visit. Home BP's had been lower, prior to her pain. She remains compliant with amlodipine. BP's have been running 135/82-85  She had been complaining of lateral L hip and knee pain.  Knee exam not performed (unable to assess with clothing she was wearing). She was shown stretches for the hip, discussed heat, massage, icing and topical meds for the knees, and trial of NSAIDs. We discussed poss OA of knees, and encouraged continued weight loss. We discussed possible x-rays of knee/hip if not improving.   Today she reports that her hip is better. Her L knee feels a little swollen, just got off working 6 days straight.  It feels tight, pops with walking. She reports it gave out (after it popped) over the weekend while at work.  Biofreeze helped. Declines x-ray.    Lab results reviewed: Vitamin D deficiency:  She had been taking MVI with 1000 IU and 2000 IU of separate D3 at the time of her last check, when Vitamin D-OH was low at 25.2.  She was advised to increase the D3 to taking TWO of the 2000 IU capsules, along with MVI daily (increasing total dose from 3000 IU to 5000 IU daily). She reports compliance with the higher dose  Lab Results  Component Value Date   CHOL 179 05/25/2021   HDL 50 05/25/2021   LDLCALC 112 (H) 05/25/2021   TRIG 95 05/25/2021   CHOLHDL 3.6 05/25/2021   Fasting gluose 85    Chemistry      Component Value Date/Time   NA 144  05/25/2021 1018   K 4.0 05/25/2021 1018   CL 105 05/25/2021 1018   CO2 23 05/25/2021 1018   BUN 10 05/25/2021 1018   CREATININE 0.74 05/25/2021 1018   CREATININE 0.71 07/18/2017 1638      Component Value Date/Time   CALCIUM 9.1 05/25/2021 1018   ALKPHOS 74 05/25/2021 1018   AST 9 05/25/2021 1018   ALT 9 05/25/2021 1018   BILITOT 0.3 05/25/2021 1018     HIV negative, RPR nonreactive.  PMH, PSH, SH reviewed  Outpatient Encounter Medications as of 06/08/2021  Medication Sig Note   amLODipine (NORVASC) 10 MG tablet Take 1 tablet (10 mg total) by mouth daily.    cholecalciferol (VITAMIN D) 1000 UNITS tablet Take 2,000 Units at bedtime by mouth.  06/08/2021: Taking two 2000 IU daily (total dose 4000)   fluticasone (FLONASE) 50 MCG/ACT nasal spray Place 2 sprays into both nostrils daily.    Multiple Vitamins-Minerals (MULTI ADULT GUMMIES PO) Take 2 each by mouth daily.    meloxicam (MOBIC) 15 MG tablet Take 1 tablet (15 mg total) by mouth daily. Take daily with food, until pain has resolved (Patient not taking: No sig reported)    methocarbamol (ROBAXIN) 500 MG tablet TAKE 1 TO 2 TABLETS BY MOUTH EVERY 6 HOURS AS NEEDED FOR MUSCLE SPASMS (Patient not  taking: Reported on 06/08/2021) 06/08/2021: Last needed last week (for back)   No facility-administered encounter medications on file as of 06/08/2021.   Allergies  Allergen Reactions   Penicillins Hives and Other (See Comments)    Has patient had a PCN reaction causing immediate rash, facial/tongue/throat swelling, SOB or lightheadedness with hypotension: No Has patient had a PCN reaction causing severe rash involving mucus membranes or skin necrosis: No Has patient had a PCN reaction that required hospitalization No Has patient had a PCN reaction occurring within the last 10 years: No If all of the above answers are "NO", then may proceed with Cephalosporin use.    ROS: Denies fever, chills, URI symptoms, headaches, dizziness,  shortness of breath, chest pain.  Denies nausea, vomiting, bowel changes, urinary complaints, bleeding, bruising, rash. Left hip pain improved, persistent L knee pain/tightness No abnormal vaginal discharge, bleeding, odor, itch. Cycles are regular, with significant cramping. See HPI   PHYSICAL EXAM:  BP 130/80   Pulse 84   Ht 5\' 4"  (1.626 m)   Wt (!) 388 lb (176 kg)   LMP 05/22/2021   BMI 66.60 kg/m   Wt Readings from Last 3 Encounters:  06/08/21 (!) 388 lb (176 kg)  05/25/21 (!) 386 lb 12.8 oz (175.5 kg)  02/18/21 (!) 398 lb 6.4 oz (180.7 kg)   Well-appearing, pleasant female, in no distress HEENT: conjunctiva and sclera are clear, EOMI, wearing mask Heart: regular rate and rhythm Lungs: clear bilaterally Breast exam: No tenderness, masses, or nipple discharge or inversion. No axillary lymphadenopathy Pelvic exam:  Normal external genitalia without lesions.  Bimanual exam was limited due to body habitus.  Nontender, no masses could be appreciated. No cervical motion tenderness or cervical lesions. No abnormal discharge.  Pap performed Extremities: No clubbing, cyanosis or edema. No longer had discomfort with external rotation of L hip.  She was tender along medial aspect of L knee. No erythema, warmth, effusion or popliteal swelling   ASSESSMENT/PLAN:  Acute pain of left knee - pain is medial, no effusion/warmth. Cont wt loss, trial compression while at work. Cont topical meds prn, NSAIDs prn, icing prn  Encounter for gynecological examination without abnormal finding  Cervical cancer screening - Plan: Cytology - PAP([facility])  Vitamin D deficiency - cont 4000 IU D3 plus MVI daily, longterm. recheck in 6 mos  Declined knee x-rays, will contact 02/20/21 if persistent pain.  F/u 6 months for med check, sooner prn. Recheck D at that visit.

## 2021-06-08 ENCOUNTER — Ambulatory Visit (INDEPENDENT_AMBULATORY_CARE_PROVIDER_SITE_OTHER): Payer: 59 | Admitting: Family Medicine

## 2021-06-08 ENCOUNTER — Encounter: Payer: Self-pay | Admitting: Family Medicine

## 2021-06-08 ENCOUNTER — Other Ambulatory Visit (HOSPITAL_COMMUNITY)
Admission: RE | Admit: 2021-06-08 | Discharge: 2021-06-08 | Disposition: A | Discharge status: Home / Self Care | Payer: 59 | Source: Ambulatory Visit | Attending: Family Medicine | Admitting: Family Medicine

## 2021-06-08 ENCOUNTER — Other Ambulatory Visit: Payer: Self-pay

## 2021-06-08 VITALS — BP 130/80 | HR 84 | Ht 64.0 in | Wt 388.0 lb

## 2021-06-08 DIAGNOSIS — M25562 Pain in left knee: Secondary | ICD-10-CM | POA: Diagnosis not present

## 2021-06-08 DIAGNOSIS — Z124 Encounter for screening for malignant neoplasm of cervix: Secondary | ICD-10-CM

## 2021-06-08 DIAGNOSIS — M25552 Pain in left hip: Secondary | ICD-10-CM

## 2021-06-08 DIAGNOSIS — E559 Vitamin D deficiency, unspecified: Secondary | ICD-10-CM | POA: Diagnosis not present

## 2021-06-08 DIAGNOSIS — Z01419 Encounter for gynecological examination (general) (routine) without abnormal findings: Secondary | ICD-10-CM | POA: Diagnosis not present

## 2021-06-08 DIAGNOSIS — A5901 Trichomonal vulvovaginitis: Secondary | ICD-10-CM

## 2021-06-08 NOTE — Patient Instructions (Signed)
Continue to work on weight loss. If your knee pain persists/worsens, let us know and we can check an x-ray. In the meantime, continue icing as needed, biofreeze or other topical medications. Continue anti-inflammatories if/when needed. Consider wearing a compression sleeve when on your feel (ie working).  Continue to monitor blood pressure, and follow up sooner if consistently >135-140/85-90 (goal is under 130/80).  Continue on 4000 IU of D3 plus your multivitamin. We will recheck this in 6 months.

## 2021-06-09 ENCOUNTER — Other Ambulatory Visit: Payer: Self-pay | Admitting: Family Medicine

## 2021-06-09 DIAGNOSIS — M62838 Other muscle spasm: Secondary | ICD-10-CM

## 2021-06-10 ENCOUNTER — Encounter: Payer: Self-pay | Admitting: Family Medicine

## 2021-06-10 LAB — CYTOLOGY - PAP
Chlamydia: NEGATIVE
Comment: NEGATIVE
Comment: NEGATIVE
Comment: NORMAL
Diagnosis: NEGATIVE
High risk HPV: NEGATIVE
Neisseria Gonorrhea: NEGATIVE

## 2021-06-10 MED ORDER — METRONIDAZOLE 500 MG PO TABS
500.0000 mg | ORAL_TABLET | Freq: Two times a day (BID) | ORAL | 0 refills | Status: AC
Start: 2021-06-10 — End: 2021-06-17

## 2021-06-10 NOTE — Addendum Note (Signed)
Addended byJoselyn Arrow on: 06/10/2021 02:54 PM   Modules accepted: Orders

## 2021-08-05 ENCOUNTER — Other Ambulatory Visit: Payer: Self-pay | Admitting: Family Medicine

## 2021-08-05 DIAGNOSIS — M62838 Other muscle spasm: Secondary | ICD-10-CM

## 2021-08-06 NOTE — Telephone Encounter (Signed)
Called pt and she does need this refilled for muscle spasms in back and knee, she has been in more pain since starting getting cold outside.

## 2021-09-14 ENCOUNTER — Other Ambulatory Visit: Payer: 59

## 2021-10-09 ENCOUNTER — Ambulatory Visit
Admission: RE | Admit: 2021-10-09 | Discharge: 2021-10-09 | Disposition: A | Discharge status: Home / Self Care | Payer: 59 | Source: Ambulatory Visit | Attending: Family Medicine | Admitting: Family Medicine

## 2021-10-09 ENCOUNTER — Ambulatory Visit
Admission: RE | Admit: 2021-10-09 | Discharge: 2021-10-09 | Disposition: A | Discharge status: Home / Self Care | Payer: Medicaid Other | Source: Ambulatory Visit | Attending: Family Medicine | Admitting: Family Medicine

## 2021-10-09 DIAGNOSIS — R928 Other abnormal and inconclusive findings on diagnostic imaging of breast: Secondary | ICD-10-CM

## 2021-10-09 DIAGNOSIS — N6322 Unspecified lump in the left breast, upper inner quadrant: Secondary | ICD-10-CM | POA: Diagnosis not present

## 2021-12-06 NOTE — Progress Notes (Signed)
Chief Complaint  ?Patient presents with  ? Hypertension  ?  Fasting med check. No concerns. Patient wanted you to know that her bp has been running 135/82-she has very bad menstrual cramps today which might make it higher. Needs refills on amlodipine and flonase-her allergies have been very bad.   ? ?Patient presents for 6 month follow-up. ? 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 She was treated with flagyl. She and partner were both treated and she has been using condoms since. Denies any unprotected sex, vaginal discharge. ? ?Medial L knee pain--discussed at last visit, at which time x-rays were declined.  She was advised to continue to try and lose weight, use compression while at work, continue topical meds, NSAIDs and icing prn.  She has occasional pain with weather changes.  She wears a compression copper brace while at work, which helps.  Denies giving way. Hasn't had further weight loss. ? ?Vitamin D deficiency:  She had been taking MVI with 1000 IU and 2000 IU of separate D3 at the time of her last check, when Vitamin D-OH was low at 25.2.  She increased the D3 to taking TWO of the 2000 IU capsules, along with MVI daily (increasing total dose from 3000 IU to 5000 IU daily). She reports compliance with the higher dose (4000 IU of D3 + MVI). ?  ?Hypertension follow-up:  She reports compliance in taking amlodipine 10mg . She denies side effects. ?(She previously also took HCTZ 25mg  daily, but had been off diuretics and BP's remained okay, so kept off it). ?BP's at home are running 125-132/80's. ?Sometimes is higher when stressed at work, or when having painful menstrual cramps (up to 140/86). ?She denies headaches, dizziness, chest pain, palpitations, shortness of breath, edema. ?  ?Obesity:  She has cut back on carbs, eating more fruits and vegetables.  She had lost 12# since June-- hasn't regained, but hasn't had further loss since 05/2021 visit. ?Walks after work 30 minutes, if she isn't too  tired (usually just 2x/week). ?She mainly drinks water, no longer has any soda.  Has sweet tea once/week. ?Tries to limit carbs (admits pasta is her weakness).  ?  ?Fatty liver was noted on RUQ ultrasound done 10/2019.  At that time LFT's were also elevated.  Most recent LFT's were normal. ? ?Seasonal allergies: Doing well on Flonase and Claritin.  She has some ongoing drainage (clear), denies sinus significant headaches (occasional). Has some eye symptoms. ? ? ? ?PMH, PSH, SH reviewed ? ?Outpatient Encounter Medications as of 12/07/2021  ?Medication Sig Note  ? amLODipine (NORVASC) 10 MG tablet Take 1 tablet (10 mg total) by mouth daily.   ? cholecalciferol (VITAMIN D) 1000 UNITS tablet Take 2,000 Units at bedtime by mouth.  06/08/2021: Taking two 2000 IU daily (total dose 4000)  ? fluticasone (FLONASE) 50 MCG/ACT nasal spray Place 2 sprays into both nostrils daily.   ? Multiple Vitamins-Minerals (MULTI ADULT GUMMIES PO) Take 2 each by mouth daily.   ? [DISCONTINUED] meloxicam (MOBIC) 15 MG tablet Take 1 tablet (15 mg total) by mouth daily. Take daily with food, until pain has resolved (Patient not taking: No sig reported)   ? [DISCONTINUED] methocarbamol (ROBAXIN) 500 MG tablet TAKE 1 TO 2 TABLETS BY MOUTH EVERY 6 HOURS AS NEEDED FOR MUSCLE SPASMS   ? ?No facility-administered encounter medications on file as of 12/07/2021.  ? ?Allergies  ?Allergen Reactions  ? Penicillins Hives and Other (See Comments)  ?  Has patient had  a PCN reaction causing immediate rash, facial/tongue/throat swelling, SOB or lightheadedness with hypotension: No ?Has patient had a PCN reaction causing severe rash involving mucus membranes or skin necrosis: No ?Has patient had a PCN reaction that required hospitalization No ?Has patient had a PCN reaction occurring within the last 10 years: No ?If all of the above answers are "NO", then may proceed with Cephalosporin use.  ? ? ?ROS: no dizziness, chest pain, shortness of breath. No fever,  chills, URI symptoms. No nausea, vomiting, bowel changes. No urinary complaints. ?RLS remain improved, drinking more water. ?Allergies per HPI, not completely controlled. ?L knee pain overall improved somewhat, does well with knee brace when working. ? ? ?PHYSICAL EXAM: ? ?BP 136/86   Pulse 64   Ht 5\' 4"  (1.626 m)   Wt (!) 386 lb 3.2 oz (175.2 kg)   LMP 12/05/2021 (Exact Date)   BMI 66.29 kg/m?  ?136/84 on repeat by MD, large cuff (NOT thigh) ?(In pain now from menstrual cramps) ?Wt Readings from Last 3 Encounters:  ?12/07/21 (!) 386 lb 3.2 oz (175.2 kg)  ?06/08/21 (!) 388 lb (176 kg)  ?05/25/21 (!) 386 lb 12.8 oz (175.5 kg)  ? ?Well appearing, pleasant, morbidly obese female in no distress. ?HEENT: PERRL, EOMI, conjunctiva and sclera are clear. ?Nose: mild-mod edema of mucosa L>R, no erythema, no purulence.  ?Sinuses--mildly tender at L maxillary sinus ?OP is clear ?Neck: No lymphadenopathy or mass, no carotid bruit ?Heart: regular rate and rhythm, no murmur ?Lungs: clear bilaterally, no wheezes, rales, ronchi ?Back: no spinal tenderness or CVA tenderness.  ?Abdomen: obese, soft, nontender ?Skin: normal turgor, no rash ?Extremities: no pitting edema. ?Psych: normal mood, affect, hygiene and grooming ?Neuro: alert and oriented, normal gait, strength ?  ? ?ASSESSMENT/PLAN: ? ?Essential hypertension - Borderline high today, having some pain related to cramps.  Lower at home.  Cont amlodipine, low Na diet, wt loss, exercise - Plan: amLODipine (NORVASC) 10 MG tablet ? ?Vitamin D deficiency - cont on current dose. Plan to recheck at CPE in October. ? ?Seasonal allergic rhinitis, unspecified trigger - discussed antihistamines, inhaled steroids (proper use reviewed), to avoid decongestants. Sinus rinses prn sinus pain. OTC eye drops disc prn - Plan: fluticasone (FLONASE) 50 MCG/ACT nasal spray ? ?Class 3 severe obesity due to excess calories with serious comorbidity and body mass index (BMI) of 60.0 to 69.9 in adult  [facility]) - counseled re: healthy diet, portions, exercise, wt loss, risks. ? ?F/u as scheduled for CPE 05/2022 ?Will check D level with physical labs. ? ?

## 2021-12-07 ENCOUNTER — Ambulatory Visit (INDEPENDENT_AMBULATORY_CARE_PROVIDER_SITE_OTHER): Payer: 59 | Admitting: Family Medicine

## 2021-12-07 ENCOUNTER — Encounter: Payer: Self-pay | Admitting: Family Medicine

## 2021-12-07 VITALS — BP 136/86 | HR 64 | Ht 64.0 in | Wt 386.2 lb

## 2021-12-07 DIAGNOSIS — Z6841 Body Mass Index (BMI) 40.0 and over, adult: Secondary | ICD-10-CM | POA: Diagnosis not present

## 2021-12-07 DIAGNOSIS — I10 Essential (primary) hypertension: Secondary | ICD-10-CM

## 2021-12-07 DIAGNOSIS — E559 Vitamin D deficiency, unspecified: Secondary | ICD-10-CM | POA: Diagnosis not present

## 2021-12-07 DIAGNOSIS — J302 Other seasonal allergic rhinitis: Secondary | ICD-10-CM

## 2021-12-07 MED ORDER — AMLODIPINE BESYLATE 10 MG PO TABS: 10.0000 mg | ORAL_TABLET | Freq: Every day | ORAL | 5 refills | Status: DC

## 2021-12-07 MED ORDER — FLUTICASONE PROPIONATE 50 MCG/ACT NA SUSP: 2.0000 | Freq: Every day | NASAL | 11 refills | Status: DC

## 2021-12-07 NOTE — Patient Instructions (Addendum)
Try and eliminate sweet tea.  You can drink unsweetened tea (and use artificial sweetener, if needed). Continue to drink mostly water. ?Try and cut back on carbs (portions sizes are key; use more whole grain carbs when you can). ?Increase fruit and vegetables. ?Healthy snacks include yogurt, fruit (apples with peanut butter, grapes). ?Nuts aren't bad, but need to be small portions and unsalted. ?Avoid fried foods. ? ?Allergies--ensure you are just doing gentle sniffs of the flonase (you are sniffing too hard if you taste/swallow it).  You can consider switching up antihistamines if you want to see if zyrtec or allegra might be more effective. ?Avoid D versions of the antihistamines (they will raise blood pressure). ?Consider taking Mucinex (plain, single ingredient of guafenesin), this helps thin out the mucus that can get hung up in the sinuses and in the back of the throat. ?You can also try Neti-pot or sinus rinse kit to help with the sinus pressure. ?Be sure to use distilled or boiled water. ? ?You can try eye drops for allergies (Naphcon-A, patanol, etc) to help with the itchy/watery eyes. ? ?

## 2021-12-29 ENCOUNTER — Telehealth: Payer: Self-pay | Admitting: Family Medicine

## 2021-12-29 NOTE — Telephone Encounter (Signed)
Pt came in and dropped off [name] to be completed. PT's last CPE was 05/25/2021. Please call pt at [phone] when ready.  ?

## 2021-12-30 NOTE — Telephone Encounter (Signed)
FFO.  Unsure if a TB test is required (there is a place to enter "TB skin test results". ?Unsure if this is required, and if so, will need to come for bloodwork (Quantiferon) ? ?

## 2021-12-31 NOTE — Telephone Encounter (Signed)
Patient did not need [place] to do TB portion, already had at her current job and gave then the form. She will pick up form today. ?

## 2022-03-22 ENCOUNTER — Other Ambulatory Visit: Payer: Self-pay | Admitting: Family Medicine

## 2022-03-22 DIAGNOSIS — J302 Other seasonal allergic rhinitis: Secondary | ICD-10-CM

## 2022-03-22 DIAGNOSIS — A5901 Trichomonal vulvovaginitis: Secondary | ICD-10-CM

## 2022-03-22 NOTE — Telephone Encounter (Signed)
Flonase filled in April x 1 year and would most likely need appt for refill on flagyl-asked patient to please give mea call,left her a message.

## 2022-04-28 ENCOUNTER — Encounter: Payer: Self-pay | Admitting: Internal Medicine

## 2022-05-20 ENCOUNTER — Encounter: Payer: Self-pay | Admitting: Family Medicine

## 2022-05-20 ENCOUNTER — Ambulatory Visit (INDEPENDENT_AMBULATORY_CARE_PROVIDER_SITE_OTHER): Payer: 59 | Admitting: Family Medicine

## 2022-05-20 VITALS — BP 124/84 | HR 88 | Temp 97.4°F | Ht 64.0 in | Wt 387.4 lb

## 2022-05-20 DIAGNOSIS — H1011 Acute atopic conjunctivitis, right eye: Secondary | ICD-10-CM

## 2022-05-20 DIAGNOSIS — J302 Other seasonal allergic rhinitis: Secondary | ICD-10-CM

## 2022-05-20 DIAGNOSIS — Z23 Encounter for immunization: Secondary | ICD-10-CM

## 2022-05-20 NOTE — Patient Instructions (Signed)
  Drink plenty of water (goal is for your urine to be a very pale yellow or clear).  Restart Flonase daily.  This could take up to 10 days to see the full effect, so you should also take an oral antihistamine once daily (claritin or allegra or zyrtec). For your eyes--you can try over-the-counter eye drops for allergies.  These include medications like Patanol, Zaditor.  Naphcon-A or Visine-A (A meaning "allergy/antihistamine") may be less expensive but also helpful for the watering and itching.  You can try using sinus rinses (Neti-pot or sinus rinse kit--using either boiled or distilled water, not tap water) to help with your sinus pain.  Do this once or twice daily. You may also want to take Mucinex or Rotibussin (plain or DM--the DM version has a cough suppressant).  The guaifenesin is the active ingredient which is an expectorant and thins out the mucus (which helps with cough, sinus pain, chest congestion).  If your sinus pain worsens and your mucus is consistently discolored, contact us for an antibiotic to treat a sinus infection. I expect that you will have some crusting of the right eye tonight (due to it being watery). If it gets very thick, crusted and yellow/green, you may need an antibiotic drop. It is surprising that you are only having symptoms in 1 eye.  I wouldn't surprised if the left eye gets red and itchy as well.

## 2022-05-20 NOTE — Progress Notes (Signed)
Chief Complaint  Patient presents with   Sinus Problem    SInus drainage and red itchy eyes. Had covid 3 weeks ago, felt better. Thinks her allergies are acting up due to weather change. Throat is sore due to drainage. No fever, chills or body aches. Would like a flu shot if you're okay with it.   9/22 she started with HA, PND, sore throat and cough.  R eye is red and watering since she woke up this morning. Denies any crusting. No injury to R eye. R eye is itchy.  No change to vision--slightly blurry due to watering. Denies eye pain or light sensitivity.  +sneezing, clear mucus.  First thing in the morning she might see slightly yellow, clear the rest of the day. Slight cough--yellow at first, clear later.  Denies shortness of breath, wheezing, chest tightness. Some pain in the chest after coughing a lot. Pain across her forehead. She has pain with swallowing, sore throat.  She has been taking Nyquil, Tylenol (helped temporarily).  Hasn't used anything today. Denies sick contacts.  She has h/o allergies, year-round.  Had an issue with her pharmacy where she hasn't been able to get her Flonase for the last month (despite having many refills).  She recovered from [place] fully prior to onset of these symptoms.  PMH, SH, SH reviewed  Outpatient Encounter Medications as of 05/20/2022  Medication Sig Note   amLODipine (NORVASC) 10 MG tablet Take 1 tablet (10 mg total) by mouth daily.    cholecalciferol (VITAMIN D) 1000 UNITS tablet Take 2,000 Units at bedtime by mouth.  06/08/2021: Taking two 2000 IU daily (total dose 4000)   Multiple Vitamins-Minerals (MULTI ADULT GUMMIES PO) Take 2 each by mouth daily.    fluticasone (FLONASE) 50 MCG/ACT nasal spray Place 2 sprays into both nostrils daily. (Patient not taking: Reported on 05/20/2022) 05/20/2022: Hasn't used in a month   No facility-administered encounter medications on file as of 05/20/2022.   Allergies  Allergen Reactions   Penicillins  Hives and Other (See Comments)    Has patient had a PCN reaction causing immediate rash, facial/tongue/throat swelling, SOB or lightheadedness with hypotension: No Has patient had a PCN reaction causing severe rash involving mucus membranes or skin necrosis: No Has patient had a PCN reaction that required hospitalization No Has patient had a PCN reaction occurring within the last 10 years: No If all of the above answers are "NO", then may proceed with Cephalosporin use.   ROS: no f/c, n/v/d, no ear pain.  URI symptoms per HPI. No chest pain (only when she coughs), shortness, no rash, bleeding/bruising. See HPI.  PHYSICAL EXAM:  BP 124/84   Pulse 88   Temp (!) 97.4 F (36.3 C) (Tympanic)   Ht _0  (1.626 m)   Wt (!) 387 lb 6.4 oz (175.7 kg)   LMP 05/15/2022 (Exact Date)   BMI 66.50 kg/m   Pleasant female, with some sniffling and coughing during visit. HEENT: conjunctiva and sclera are clear on the L. Mild diffuse conjunctival injection on the right.  No purulence/crusting or soft tissue swelling. Nasal mucosa is mildly edematous, no purulence or erythema. +mild tenderness over frontal sinuses, nontender over maxillary.  OP is clear, no erythema/exudates or any mucosal lesions Neck: no lymphadenopathy or mass Heart: regular rate and rhythm Lungs: clear bilaterally   ASSESSMENT/PLAN:  Seasonal allergic rhinitis, unspecified trigger - resume Flonase, antihistamines prn.  mucinex and sinus rinses for sinus pain. s/sx bacterial infx reviewed, f/u if worse  Allergic conjunctivitis of right eye - surprised not bilateral, but classic appearance and sx. Trial of OTC drops for allergies  Need for influenza vaccination - Plan: Flu Vaccine QUAD 6+ mos PF IM (Fluarix Quad PF)  Drink plenty of water (goal is for your urine to be a very pale yellow or clear).  Restart Flonase daily.  This could take up to 10 days to see the full effect, so you should also take an oral antihistamine once  daily (claritin or allegra or zyrtec). For your eyes--you can try over-the-counter eye drops for allergies.  These include medications like Patanol, Zaditor.  Naphcon-A or Visine-A (A meaning "allergy/antihistamine") may be less expensive but also helpful for the watering and itching.  You can try using sinus rinses (Neti-pot or sinus rinse kit--using either boiled or distilled water, not tap water) to help with your sinus pain.  Do this once or twice daily. You may also want to take Mucinex or Rotibussin (plain or DM--the DM version has a cough suppressant).  The guaifenesin is the active ingredient which is an expectorant and thins out the mucus (which helps with cough, sinus pain, chest congestion).  If your sinus pain worsens and your mucus is consistently discolored, contact us for an antibiotic to treat a sinus infection. I expect that you will have some crusting of the right eye tonight (due to it being watery). If it gets very thick, crusted and yellow/green, you may need an antibiotic drop. It is surprising that you are only having symptoms in 1 eye.  I wouldn't surprised if the left eye gets red and itchy as well.

## 2022-06-10 ENCOUNTER — Telehealth: Payer: Self-pay | Admitting: Family Medicine

## 2022-06-10 NOTE — Progress Notes (Deleted)
No chief complaint on file.   Marissa Lowe is a 41 y.o. female who presents for a complete physical.   She has the following concerns:  UPDATE ALL   Medial L knee pain--She has occasional pain with weather changes.  She wears a compression copper brace while at work, which helps.  Denies giving way. Hasn't had further weight loss.   Vitamin D deficiency:  She had been taking MVI with 1000 IU and 2000 IU of separate D3 at the time of her last check, when Vitamin D-OH was low at 25.2.  She increased the D3 to taking TWO of the 2000 IU capsules, along with MVI daily (increasing total dose from 3000 IU to 5000 IU daily). She reports compliance with the higher dose (4000 IU of D3 + MVI).   Hypertension follow-up:  She reports compliance in taking amlodipine 10mg . She denies side effects. (She previously also took HCTZ 25mg  daily, but had been off diuretics and BP's remained okay, so kept off it). BP's at home are running 125-132/80's. Sometimes is higher when stressed at work, or when having painful menstrual cramps (up to 140/86). She denies headaches, dizziness, chest pain, palpitations, shortness of breath, edema.   Obesity:  She has cut back on carbs, eating more fruits and vegetables.  She had lost 12# since June-- hasn't regained, but hasn't had further loss since 05/2021 visit. Walks after work 30 minutes, if she isn't too tired (usually just 2x/week). She mainly drinks water, no longer has any soda.  Has sweet tea once/week. Tries to limit carbs (admits pasta is her weakness).    Fatty liver was noted on RUQ ultrasound done 10/2019.  At that time LFT's were also elevated.  Most recent LFT's were normal.   Seasonal allergies: Doing well on Flonase and Claritin.  She has some ongoing drainage (clear), denies sinus significant headaches (occasional). Has some eye symptoms.    OLD Complaining of L hip and L knee pain.  The knee feels "tight".  No known injury.  It hurts more with cold  weather, she wonders if it is arthritis.  The pain at the hip is lateral.  Denies pain at the groin.  Sometimes she notices the pain with walking, gets a "catch" (at both hip and knee), no giving way.  She is on her cycle today, cannot have her pap smear.  She has a lot of cramping the first 3 days of her cycle, in a lot of discomfort today.  Pamprin usually works, hasn't taken anything today due to fasting for visit.  Hypertension follow-up:  She reports compliance in taking amlodipine 10mg . She denies side effects. (She previously also took HCTZ 25mg  daily, but had been off diuretics and BP's remained okay, so kept off it). BP's at home are running 120-135/82 (max of 89). She expected it to be high today due to her pain. She denies headaches, dizziness, chest pain, palpitations, shortness of breath, edema.  h/o Vitamin D deficiency.  Last level was low at 19.2 in 10/2020. At that time she reported compliance in taking 2000 IU of Vitamin D3 daily plus gummy MVI daily (containing 1000 IU).  She was treated with 12 weeks of prescription weekly therapy, and since completing has resumed taking a total of 3000 IU daily.  Obesity:  She has cut back on carbs, eating more fruits and vegetables.  She has lost 12# since June.  Walks after work 30 minutes, if she isn't too tired (usually just 2x/week). She  only rarely drinks a soda (prev had regular Mtn Dew daily), mostly drinking water, some juice.  Doesn't like the after taste of diet sodas.    Fatty liver was noted on RUQ ultrasound done 10/2019.  At that time LFT's were also elevated.  Most recent LFT's were normal. Lab Results  Component Value Date   ALT 9 05/25/2021   AST 9 05/25/2021   ALKPHOS 74 05/25/2021   BILITOT 0.3 05/25/2021     Immunization History  Administered Date(s) Administered   Influenza,inj,Quad PF,6+ Mos 05/09/2014, 05/16/2016, 06/29/2017, 05/19/2020, 05/25/2021, 05/20/2022   Influenza-Unspecified 05/24/2015, 05/17/2019    Moderna Covid-19 Vaccine Bivalent Booster 63yrs & up 05/25/2021   Moderna SARS-COV2 Booster Vaccination 02/18/2021   Moderna Sars-Covid-2 Vaccination 01/15/2020, 02/12/2020   Tdap 03/01/2013, 05/15/2016   h/o varicella in the past, + titer in 10/2015 with prenatal labs Last Pap smear: 10/2015 (during pregnancy), normal, no high risk HPV  (+trich, BV and yeast) She had unprotected sex with a partner (former boyfriend) since last physical.   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  F/u scheduled for 08/2021 Last colonoscopy: never   Last DEXA: never   Dentist: twice yearly   Ophtho: never Exercise: Walks 30 minutes after work 2 days/week. Not currently getting much weight-bearing exercise (doesn't lift her son, no much heavy lifting on the job anymore).   Lipid screen: Lab Results  Component Value Date   CHOL 179 05/25/2021   HDL 50 05/25/2021   LDLCALC 112 (H) 05/25/2021   TRIG 95 05/25/2021   CHOLHDL 3.6 05/25/2021    PMH, PSH, SH and FH were reviewed and updated     ROS: The patient denies anorexia, fever, vision changes, decreased hearing, ear pain, sore throat, breast concerns, chest pain, palpitations, lightheadedness, syncope, dyspnea on exertion, cough, swelling, nausea, vomiting, diarrhea, constipation, abdominal pain, melena, hematochezia, indigestion/heartburn, hematuria, incontinence, dysuria, vaginal discharge, odor or itch, genital lesions,numbness, tingling, weakness, tremor, suspicious skin lesions, depression, anxiety, abnormal bleeding/bruising, or enlarged lymph nodes.   +menstrual cramps currently. L knee and hip pain per HPI. Some congestion, year-round allergies. This is controlled by Flonase. 12# weight loss since June Mild PMDD symptoms currently.  PHYSICAL EXAM:  LMP 05/15/2022 (Exact Date)   No results found.   Wt Readings from Last 3 Encounters:  05/20/22 (!) 387 lb 6.4 oz (175.7 kg)  12/07/21 (!) 386 lb 3.2 oz  (175.2 kg)  06/08/21 (!) 388 lb (176 kg)    General Appearance:   Alert, cooperative, no distress, appears stated age, morbidly obese. She is in visible discomfort from menstrual cramps    Head:   Normocephalic, without obvious abnormality, atraumatic.  Eyes:   PERRL, conjunctiva/corneas clear, EOM's intact, fundi benign.  Ears:   Normal TM's and external ear canals  Nose:   Not examined, wearing mask due to COVID-19 pandemic  Throat:   Not examined, wearing mask due to COVID-19 pandemic    Neck:   Supple, no lymphadenopathy; thyroid: no enlargement/ tenderness/nodules; no carotid bruit or JVD    Back:   Spine nontender, no curvature, ROM normal, no CVA tenderness    Lungs:   Clear to auscultation bilaterally without wheezes, rales or ronchi; respirations unlabored    Chest Wall:   No tenderness or deformity    Heart:   Regular rate and rhythm, S1 and S2 normal, no murmur, rub or gallop    Breast Exam:   Not examined today (pt declined to get  undressed)  Abdomen:   Soft, nondistended, normoactive bowel sounds, no masses, no hepatosplenomegaly. Obese. Mildly tender at LLQ, no rebound or guarding  Genitalia:   Deferred due to heavy menstrual cycle  Rectal:   Exam deferred today  Extremities:   No clubbing, cyanosis or edema. small nontender ganglion at top of R foot. No pain at patella, nontender to palpation.  Not undressed, could not perform full knee eval.  She had pain at lateral hip with external rotation, tender along muscle/tendon at lateralmost portion of groin. Nontender at trochanteric bursa  Pulses:   2+ and symmetric all extremities    Skin:   Skin color, texture, turgor normal, no rashes or lesions. +tattoos   Lymph nodes:   Cervical, supraclavicular, and axillary nodes normal    Neurologic:   Normal strength, sensation and gait; reflexes 2+ and symmetric throughout                         Psych:   Normal mood, affect, hygiene and grooming.      12/07/2021   10:18 AM 05/25/2021     9:42 AM 05/25/2021    9:41 AM 11/17/2020   11:38 AM 05/19/2020    3:12 PM  Depression screen PHQ 2/9  Decreased Interest 0 1 1 1  0  Down, Depressed, Hopeless 0 1 1 1  0  PHQ - 2 Score 0 2 2 2  0  Altered sleeping  0     Tired, decreased energy  1     Change in appetite  0     Feeling bad or failure about yourself   0     Trouble concentrating  0     Moving slowly or fidgety/restless  0     Suicidal thoughts  0     PHQ-9 Score  3      She feels like her mild depressive symptoms are specifically related to her current menstrual cycle, tends to have mood changes with her cycles. Denies depression any other time, not interested in treatment/discussion.   ASSESSMENT/PLAN:  No diagnosis found.   Return in 1-2 weeks for breast exam, pelvic, pap smear.  Can also look at L knee. Will need GC/chlamydia.  Discussed monthly self breast exams and yearly mammograms; at least 30 minutes of aerobic activity at least 5 days/week, weight-bearing exercise at least 2x/wk; proper sunscreen use reviewed; healthy diet, including goals of calcium and vitamin D intake and alcohol recommendations (less than or equal to 1 drink/day) reviewed; regular seatbelt use; changing batteries in smoke detectors.  Immunization recommendations discussed, flu shot and bivalent COVID vaccine given today.  Colonoscopy recommendations reviewed, age 53.

## 2022-06-10 NOTE — Telephone Encounter (Signed)
Pt canceled physical for 06/14/22, did not give a reason and stated she would call back to reschedule.

## 2022-06-14 ENCOUNTER — Encounter: Payer: 59 | Admitting: Family Medicine

## 2022-09-07 ENCOUNTER — Other Ambulatory Visit: Payer: Self-pay | Admitting: Family Medicine

## 2022-09-07 DIAGNOSIS — R928 Other abnormal and inconclusive findings on diagnostic imaging of breast: Secondary | ICD-10-CM

## 2022-10-27 ENCOUNTER — Ambulatory Visit
Admission: RE | Admit: 2022-10-27 | Discharge: 2022-10-27 | Disposition: A | Discharge status: Home / Self Care | Payer: Medicaid Other | Source: Ambulatory Visit | Attending: Family Medicine | Admitting: Family Medicine

## 2022-10-27 ENCOUNTER — Ambulatory Visit
Admission: RE | Admit: 2022-10-27 | Discharge: 2022-10-27 | Disposition: A | Discharge status: Home / Self Care | Payer: 59 | Source: Ambulatory Visit | Attending: Family Medicine | Admitting: Family Medicine

## 2022-10-27 DIAGNOSIS — R928 Other abnormal and inconclusive findings on diagnostic imaging of breast: Secondary | ICD-10-CM

## 2023-11-29 ENCOUNTER — Other Ambulatory Visit: Payer: Self-pay | Admitting: Family Medicine

## 2023-11-29 DIAGNOSIS — Z1231 Encounter for screening mammogram for malignant neoplasm of breast: Secondary | ICD-10-CM

## 2023-12-04 NOTE — Progress Notes (Unsigned)
 No chief complaint on file.   Marissa Lowe is a 43 y.o. female who presents for a complete physical.  She hasn't been seen in this office since 2023. 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 She and partner were both treated. Using condoms. Denies vaginal discharge.   Medial L knee pain--previously declined X-rays. We previously discussed use of compression while at work, and weight loss, as well as icing and NSAIDs prn.   Vitamin D deficiency:  Last level was 25.3 in 05/2021 when taking MVI with 1000 IU and 2000 IU of separate D3.  She increased the D3 to taking TWO of the 2000 IU capsules, along with MVI daily (increasing total dose from 3000 IU to 5000 IU daily). She is currently taking ***   Hypertension follow-up:  She reports compliance in taking amlodipine 10mg . She denies side effects. (She previously also took HCTZ 25mg  daily, but had been off diuretics and BP's remained okay, so kept off it). BP's at home are running ***  Sometimes is higher when stressed at work, or when having painful menstrual cramps. She denies headaches, dizziness, chest pain, palpitations, shortness of breath, edema.  BP Readings from Last 3 Encounters:  05/20/22 124/84  12/07/21 136/86  06/08/21 130/80      Obesity:  She has cut back on carbs, eating more fruits and vegetables.   Walks after work 30 minutes, if she isn't too tired (usually just 2x/week). She mainly drinks water, no longer has any soda.  Has sweet tea once/week. Tries to limit carbs (admits pasta is her weakness).    Fatty liver was noted on RUQ ultrasound done 10/2019.  At that time LFT's were also elevated.  Most recent LFT's were normal.  Wt Readings from Last 3 Encounters:  05/20/22 (!) 387 lb 6.4 oz (175.7 kg)  12/07/21 (!) 386 lb 3.2 oz (175.2 kg)  06/08/21 (!) 388 lb (176 kg)   Lab Results  Component Value Date   ALT 9 05/25/2021   AST 9 05/25/2021   ALKPHOS 74  05/25/2021   BILITOT 0.3 05/25/2021      Seasonal allergies: Doing well on Flonase and Claritin.  She has some ongoing drainage (clear), denies sinus significant headaches (occasional).    Immunization History  Administered Date(s) Administered   Influenza,inj,Quad PF,6+ Mos 05/09/2014, 05/16/2016, 06/29/2017, 05/19/2020, 05/25/2021, 05/20/2022   Influenza-Unspecified 05/24/2015, 05/17/2019   Moderna Covid-19 Vaccine Bivalent Booster 57yrs & up 05/25/2021   Moderna SARS-COV2 Booster Vaccination 02/18/2021   Moderna Sars-Covid-2 Vaccination 01/15/2020, 02/12/2020   Tdap 03/01/2013, 05/15/2016   h/o varicella in the past, + titer in 10/2015 with prenatal labs Last Pap smear: 05/2021 normal, no high risk HPV  (+trich) Last mammogram: 10/2022 with stable L breast mass felt to be benign (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 Scheduled for 12/06/23. Last colonoscopy: never   Last DEXA: never   Dentist: twice yearly   Ophtho: never Exercise:   Walks 30 minutes after work 2 days/week. Not currently getting much weight-bearing exercise (doesn't lift her son, no much heavy lifting on the job anymore).   Lipid screen: Lab Results  Component Value Date   CHOL 179 05/25/2021   HDL 50 05/25/2021   LDLCALC 112 (H) 05/25/2021   TRIG 95 05/25/2021   CHOLHDL 3.6 05/25/2021    PMH, PSH, SH and FH were reviewed and updated    ROS: The patient denies anorexia, fever, vision changes, decreased hearing, ear pain, sore throat, breast concerns,  chest pain, palpitations, lightheadedness, syncope, dyspnea on exertion, cough, swelling, nausea, vomiting, diarrhea, constipation, abdominal pain, melena, hematochezia, indigestion/heartburn, hematuria, incontinence, dysuria, vaginal discharge, odor or itch, genital lesions,numbness, tingling, weakness, tremor, suspicious skin lesions, depression, anxiety, abnormal bleeding/bruising, or enlarged lymph nodes.   +menstrual cramps currently. L knee and hip pain per HPI. Some  congestion, year-round allergies. This is controlled by Flonase. 12# weight loss since June Mild PMDD symptoms currently.    PHYSICAL EXAM:  There were no vitals taken for this visit.  Wt Readings from Last 3 Encounters:  05/20/22 (!) 387 lb 6.4 oz (175.7 kg)  12/07/21 (!) 386 lb 3.2 oz (175.2 kg)  06/08/21 (!) 388 lb (176 kg)    General Appearance:   Alert, cooperative, no distress, appears stated age, morbidly obese.   Head:   Normocephalic, without obvious abnormality, atraumatic.  Eyes:   PERRL, conjunctiva/corneas clear, EOM's intact, fundi benign.  Ears:   Normal TM's and external ear canals  Nose:   No drainage or sinus tenderness  Throat:   Normal mucosa  Neck:   Supple, no lymphadenopathy; thyroid: no enlargement/ tenderness/nodules; no carotid bruit or JVD    Back:   Spine nontender, no curvature, ROM normal, no CVA tenderness    Lungs:   Clear to auscultation bilaterally without wheezes, rales or ronchi; respirations unlabored    Chest Wall:   No tenderness or deformity    Heart:   Regular rate and rhythm, S1 and S2 normal, no murmur, rub or gallop    Breast Exam:   No tenderness, masses, or nipple discharge or inversion. No axillary lymphadenopathy   Abdomen:   Soft, nondistended, normoactive bowel sounds, no masses, no hepatosplenomegaly. Obese. Mildly tender at LLQ, no rebound or guarding  Genitalia:   Normal external genitalia without lesions.  Bimanual exam was limited due to body habitus.  Nontender, no masses could be appreciated. No cervical motion tenderness or abnormal vaginal discharge.  Pap not performed   Rectal:   Normal sphincter tone, no masses. Heme negative stool  Extremities:   No clubbing, cyanosis or edema. small nontender ganglion at top of R foot. ***  Pulses:   2+ and symmetric all extremities    Skin:   Skin color, texture, turgor normal, no rashes or lesions. +tattoos   Lymph nodes:   Cervical, supraclavicular, and axillary nodes normal     Neurologic:   Normal strength, sensation and gait; reflexes 2+ and symmetric throughout                         Psych:   Normal mood, affect, hygiene and grooming.  ***UPDATE--ganglion top of R foot?? L breast mass??      12/07/2021   10:18 AM 05/25/2021    9:42 AM 05/25/2021    9:41 AM 11/17/2020   11:38 AM 05/19/2020    3:12 PM  Depression screen PHQ 2/9  Decreased Interest 0 1 1 1  0  Down, Depressed, Hopeless 0 1 1 1  0  PHQ - 2 Score 0 2 2 2  0  Altered sleeping  0     Tired, decreased energy  1     Change in appetite  0     Feeling bad or failure about yourself   0     Trouble concentrating  0     Moving slowly or fidgety/restless  0     Suicidal thoughts  0     PHQ-9 Score  3  ASSESSMENT/PLAN:  CHECK WITH [facility] TO SEE IF WE CAN DO MED CHECK WITH THIS TYPE OF MEDICAID. IF WE CANNOT, THEN WILL DO CPE AND LABS, BUT NEED TO SET UP ANOTHER VISIT FOR MED CHECK, AND NOT ADDRESS OTHER CONCERNS TODAY (OTHER THAN PHYSICAL, STD TESTING, LABS).  Guessing has been out of BP meds?? TSH if sx STD testing?? If any unprotected sex since last visit. Would do NuSwab with pelvic exam rather than urine GC/chlamydia, since h/o trich more than once (and add RPR, HIV) Needs HepC--has gap, never checked?   Discussed monthly self breast exams and yearly mammograms; at least 30 minutes of aerobic activity at least 5 days/week, weight-bearing exercise at least 2x/wk; proper sunscreen use reviewed; healthy diet, including goals of calcium and vitamin D intake and alcohol recommendations (less than or equal to 1 drink/day) reviewed; regular seatbelt use; changing batteries in smoke detectors.  Immunization recommendations discussed, yearly flu shots recommended.  Colonoscopy recommendations reviewed, age 12.

## 2023-12-04 NOTE — Patient Instructions (Incomplete)
   HEALTH MAINTENANCE RECOMMENDATIONS:  It is recommended that you get at least 30 minutes of aerobic exercise at least 5 days/week (for weight loss, you may need as much as 60-90 minutes). This can be any activity that gets your heart rate up. This can be divided in 10-15 minute intervals if needed, but try and build up your endurance at least once a week.  Weight bearing exercise is also recommended twice weekly.  Eat a healthy diet with lots of vegetables, fruits and fiber.  "Colorful" foods have a lot of vitamins (ie green vegetables, tomatoes, red peppers, etc).  Limit sweet tea, regular sodas and alcoholic beverages, all of which has a lot of calories and sugar.  Up to 1 alcoholic drink daily may be beneficial for women (unless trying to lose weight, watch sugars).  Drink a lot of water.  Calcium recommendations are 1200-1500 mg daily (1500 mg for postmenopausal women or women without ovaries), and vitamin D 1000 IU daily.  This should be obtained from diet and/or supplements (vitamins), and calcium should not be taken all at once, but in divided doses.  Monthly self breast exams and yearly mammograms for women over the age of 109 is recommended.  Sunscreen of at least SPF 30 should be used on all sun-exposed parts of the skin when outside between the hours of 10 am and 4 pm (not just when at beach or pool, but even with exercise, golf, tennis, and yard work!)  Use a sunscreen that says "broad spectrum" so it covers both UVA and UVB rays, and make sure to reapply every 1-2 hours.  Remember to change the batteries in your smoke detectors when changing your clock times in the spring and fall. Carbon monoxide detectors are recommended for your home.  Use your seat belt every time you are in a car, and please drive safely and not be distracted with cell phones and texting while driving.

## 2023-12-05 ENCOUNTER — Encounter: Payer: Self-pay | Admitting: Family Medicine

## 2023-12-05 ENCOUNTER — Encounter: Payer: Self-pay | Admitting: *Deleted

## 2023-12-05 ENCOUNTER — Ambulatory Visit (INDEPENDENT_AMBULATORY_CARE_PROVIDER_SITE_OTHER): Admitting: Family Medicine

## 2023-12-05 VITALS — BP 170/94 | HR 84 | Ht 63.0 in | Wt 389.0 lb

## 2023-12-05 DIAGNOSIS — J302 Other seasonal allergic rhinitis: Secondary | ICD-10-CM

## 2023-12-05 DIAGNOSIS — E66813 Obesity, class 3: Secondary | ICD-10-CM

## 2023-12-05 DIAGNOSIS — E559 Vitamin D deficiency, unspecified: Secondary | ICD-10-CM

## 2023-12-05 DIAGNOSIS — K760 Fatty (change of) liver, not elsewhere classified: Secondary | ICD-10-CM

## 2023-12-05 DIAGNOSIS — Z0000 Encounter for general adult medical examination without abnormal findings: Secondary | ICD-10-CM | POA: Diagnosis not present

## 2023-12-05 DIAGNOSIS — I10 Essential (primary) hypertension: Secondary | ICD-10-CM | POA: Diagnosis not present

## 2023-12-05 DIAGNOSIS — Z113 Encounter for screening for infections with a predominantly sexual mode of transmission: Secondary | ICD-10-CM | POA: Diagnosis not present

## 2023-12-05 DIAGNOSIS — Z6841 Body Mass Index (BMI) 40.0 and over, adult: Secondary | ICD-10-CM

## 2023-12-05 MED ORDER — FLUTICASONE PROPIONATE 50 MCG/ACT NA SUSP
2.0000 | Freq: Every day | NASAL | 11 refills | Status: AC
Start: 2023-12-05 — End: ?

## 2023-12-05 MED ORDER — AMLODIPINE BESYLATE 10 MG PO TABS: 10.0000 mg | ORAL_TABLET | Freq: Every day | ORAL | 1 refills | Status: DC

## 2023-12-06 ENCOUNTER — Ambulatory Visit
Admission: RE | Admit: 2023-12-06 | Discharge: 2023-12-06 | Disposition: A | Discharge status: Home / Self Care | Source: Ambulatory Visit

## 2023-12-06 ENCOUNTER — Other Ambulatory Visit

## 2023-12-06 DIAGNOSIS — Z1231 Encounter for screening mammogram for malignant neoplasm of breast: Secondary | ICD-10-CM

## 2023-12-06 DIAGNOSIS — Z113 Encounter for screening for infections with a predominantly sexual mode of transmission: Secondary | ICD-10-CM

## 2023-12-06 DIAGNOSIS — Z0000 Encounter for general adult medical examination without abnormal findings: Secondary | ICD-10-CM

## 2023-12-06 DIAGNOSIS — I10 Essential (primary) hypertension: Secondary | ICD-10-CM

## 2023-12-06 DIAGNOSIS — E559 Vitamin D deficiency, unspecified: Secondary | ICD-10-CM

## 2023-12-07 ENCOUNTER — Encounter: Payer: Self-pay | Admitting: Family Medicine

## 2023-12-07 LAB — CMP14+EGFR
ALT: 9 IU/L (ref 0–32)
AST: 11 IU/L (ref 0–40)
Albumin: 4 g/dL (ref 3.9–4.9)
Alkaline Phosphatase: 66 IU/L (ref 44–121)
BUN/Creatinine Ratio: 18 (ref 9–23)
BUN: 11 mg/dL (ref 6–24)
Bilirubin Total: <0.2 mg/dL (ref 0.0–1.2)
CO2: 23 mmol/L (ref 20–29)
Calcium: 9.2 mg/dL (ref 8.7–10.2)
Chloride: 105 mmol/L (ref 96–106)
Creatinine, Ser: 0.62 mg/dL (ref 0.57–1.00)
Globulin, Total: 2.4 g/dL (ref 1.5–4.5)
Glucose: 98 mg/dL (ref 70–99)
Potassium: 4.1 mmol/L (ref 3.5–5.2)
Sodium: 141 mmol/L (ref 134–144)
Total Protein: 6.4 g/dL (ref 6.0–8.5)
eGFR: 113 mL/min/{1.73_m2} (ref >59)

## 2023-12-07 LAB — NUSWAB VAGINITIS PLUS (VG+)
Atopobium vaginae: HIGH {score} — AB
BVAB 2: HIGH {score} — AB
Candida albicans, NAA: NEGATIVE
Candida glabrata, NAA: NEGATIVE
Megasphaera 1: HIGH {score} — AB

## 2023-12-07 LAB — CBC WITH DIFFERENTIAL/PLATELET
Basophils Absolute: 0 10*3/uL (ref 0.0–0.2)
Basos: 1 %
EOS (ABSOLUTE): 0.1 10*3/uL (ref 0.0–0.4)
Eos: 2 %
Hematocrit: 39.4 % (ref 34.0–46.6)
Hemoglobin: 12.1 g/dL (ref 11.1–15.9)
Immature Grans (Abs): 0 10*3/uL (ref 0.0–0.1)
Immature Granulocytes: 0 %
Lymphocytes Absolute: 2 10*3/uL (ref 0.7–3.1)
Lymphs: 36 %
MCH: 23 pg — ABNORMAL LOW (ref 26.6–33.0)
MCHC: 30.7 g/dL — ABNORMAL LOW (ref 31.5–35.7)
MCV: 75 fL — ABNORMAL LOW (ref 79–97)
Monocytes Absolute: 0.4 10*3/uL (ref 0.1–0.9)
Monocytes: 8 %
Neutrophils Absolute: 3 10*3/uL (ref 1.4–7.0)
Neutrophils: 53 %
Platelets: 255 10*3/uL (ref 150–450)
RBC: 5.25 x10E6/uL (ref 3.77–5.28)
RDW: 17.3 % — ABNORMAL HIGH (ref 11.7–15.4)
WBC: 5.6 10*3/uL (ref 3.4–10.8)

## 2023-12-07 LAB — VITAMIN D 25 HYDROXY (VIT D DEFICIENCY, FRACTURES): Vit D, 25-Hydroxy: 14.9 ng/mL — ABNORMAL LOW (ref 30.0–100.0)

## 2023-12-07 LAB — LIPID PANEL
Chol/HDL Ratio: 3.1 ratio (ref 0.0–4.4)
Cholesterol, Total: 168 mg/dL (ref 100–199)
HDL: 55 mg/dL (ref >39)
LDL Chol Calc (NIH): 100 mg/dL — ABNORMAL HIGH (ref 0–99)
Triglycerides: 70 mg/dL (ref 0–149)
VLDL Cholesterol Cal: 13 mg/dL (ref 5–40)

## 2023-12-07 LAB — HIV ANTIBODY (ROUTINE TESTING W REFLEX)

## 2023-12-07 LAB — RPR

## 2023-12-07 MED ORDER — VITAMIN D (ERGOCALCIFEROL) 1.25 MG (50000 UNIT) PO CAPS: 50000.0000 [IU] | ORAL_CAPSULE | ORAL | 0 refills | Status: DC

## 2023-12-07 NOTE — Addendum Note (Signed)
 Addended by: Roosvelt Colla on: 12/07/2023 08:33 AM   Modules accepted: Orders

## 2023-12-08 ENCOUNTER — Encounter: Payer: Self-pay | Admitting: Family Medicine

## 2023-12-21 ENCOUNTER — Encounter: Payer: Self-pay | Admitting: *Deleted

## 2023-12-21 NOTE — Progress Notes (Signed)
 Chief Complaint  Patient presents with   2 Week Follow-up    2 week follow up. Right foot, heel pain and across top of foot. Shoulder pain R for a while.     Patient presents to follow up on blood pressure, and to address other issues that couldn't be addressed at her physical--right foot pain and shoulder pain.  Her BP was a more pressing issue, and I had wanted to get her labs to ensure that kidney function would tolerate use of NSAIDs.  R shoulder pain--has been hurting for about 3 months.  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 Denies neck pain, numbness, tingling or weakness. Biofreeze helps temporarily.  Tylenol  doesn't help much.  Right foot pain for 2-3 months.  She relates this to the new job, being on her feet all day.  She wears tennis shoes at work. She had prior injury to R foot (had a fracture) when she was much younger.   That pain had resolved, but she started having similar pain--pain across the top of the forefoot, extending to the lateral ankle. She is also having pain at the posterior heel (achilles tendon) for the last 2-3 months.   H/o medial L knee pain--previously declined X-rays.  She still has some L knee pain when on her feet a lot at work, now more at the front of the knee. The compression still helps some, as does tylenol . She has lost 5# in the last 2 weeks.   Hypertension follow-up:  She had been off amlodipine  10mg  for over a year. This was restarted after her physical 2 weeks ago. She denies side effects. BP's are not checked elsewhere.   BP Readings from Last 3 Encounters:  12/22/23 130/88  12/05/23 (!) 170/94  05/20/22 124/84   She denies headaches (sinus headaches are improving), dizziness, chest pain, palpitations, shortness of breath, edema.  (She previously also took HCTZ 25mg  daily, but had been off diuretics and BP's remained okay, so kept off it).     Vitamin D  deficiency:  At her physical she reported only taking a MVI daily, no separate D3 in quite a while. Level was found to be very low. She was prescribed 12 weeks of Rx, and was advised to start 1000 IU of D3 daily, in addition to the MVI, upon completion of the Rx. She is compliant with taking the weekly prescription, and has bought the OTC D3.  Component Ref Range & Units (hover) 2 wk ago (12/06/23) 2 yr ago (05/25/21) 3 yr ago (11/17/20) 6 yr ago (03/28/17) 8 yr ago (10/24/15) 8 yr ago (10/13/15) 9 yr ago (05/13/14)  Vit D, 25-Hydroxy 14.9 Low  25.3 Low  CM 19.2 Low  CM 29 Low  R, CM 15 Low  R, CM 11 Low  R, CM 18 Low  R, CM     Seasonal allergies: Currently taking Claritin and flonase .  Her allergy symptoms are improving. Her itchy/watery eyes and sinus headaches have improved since adding Flonase . No longer has runny nose.     PMH, PSH, SH reviewed  Outpatient Encounter Medications as of 12/22/2023  Medication Sig   amLODipine  (NORVASC ) 10 MG tablet Take 1 tablet (10 mg total) by mouth daily.   fluticasone  (FLONASE ) 50 MCG/ACT nasal spray Place 2 sprays into both nostrils daily.   meloxicam  (MOBIC ) 15 MG tablet Take 1 tablet (15 mg total) by mouth daily. Take with food.  Take daily until your pain has resolved   Multiple Vitamins-Minerals (MULTI ADULT GUMMIES PO) Take 2 each by mouth daily.   Vitamin D , Ergocalciferol , (DRISDOL ) 1.25 MG (50000 UNIT) CAPS capsule Take 1 capsule (50,000 Units total) by mouth every 7 (seven) days.   cholecalciferol (VITAMIN D ) 1000 UNITS tablet Take 2,000 Units at bedtime by mouth.  (Patient not taking: Reported on 12/22/2023)   No facility-administered encounter medications on file as of 12/22/2023.   NOT taking meloxicam  prior to today's visit  Allergies  Allergen Reactions   Penicillins Hives and Other (See Comments)    Has patient had a PCN reaction causing immediate rash, facial/tongue/throat swelling, SOB or lightheadedness with hypotension:  No Has patient had a PCN reaction causing severe rash involving mucus membranes or skin necrosis: No Has patient had a PCN reaction that required hospitalization No Has patient had a PCN reaction occurring within the last 10 years: No If all of the above answers are "NO", then may proceed with Cephalosporin use.     ROS: no f/c, URI symptoms, HA, CP, SOB, edema, N/V/D.   Allergies improving. R shoulder pain and R foot pain per HPI. No neck/back pain, no numbness, tingling, weakness. No urinary symptoms. See HPI    PHYSICAL EXAM:  BP 130/88   Pulse 80   Ht 5\' 3"  (1.6 m)   Wt (!) 384 lb (174.2 kg)   LMP 12/16/2023   BMI 68.02 kg/m   Wt Readings from Last 3 Encounters:  12/22/23 (!) 384 lb (174.2 kg)  12/05/23 (!) 389 lb (176.4 kg)  05/20/22 (!) 387 lb 6.4 oz (175.7 kg)   Pleasant female in no distress HEENT: conjunctiva and sclera are clear, EOMI Neck: no lymphadenopathy or mass, no spinal tenderness, FROM Heart: regular rate and rhythm,no murmur Lungs: clear bilaterally Neuro: alert and oriented, cranial nerves grossly intact, normal strength and gait Psych: normal mood, affect, hygiene and grooming Extremities: R shoulder--FROM. Pain with all motions. Pain with deltoid testing and supraspinatous testing, strength intact.  Much less discomfort with internal/external rotation against resistance Nontender at deltoid muscle. Tender at anterior shoulder over tendons. No impingement.  R foot/ankle-- Tender at achilles tendon. Tender anteriorly across the ankle and the proximal forefoot. Nontender at calcaneous by PF, nontender in arch. She has pain with plantarflexion and dorsiflexion, as well as inverion/eversion against resistance. The pain is all at the anterior ankle, extending into the proximal foot.  Centrally and slightly laterally. She is wearing a compression sleeve. Normal pulses No pitting edema  Recent labs:    Chemistry      Component Value Date/Time    NA 141 12/06/2023 0812   K 4.1 12/06/2023 0812   CL 105 12/06/2023 0812   CO2 23 12/06/2023 0812   BUN 11 12/06/2023 0812   CREATININE 0.62 12/06/2023 0812   CREATININE 0.71 07/18/2017 1638      Component Value Date/Time   CALCIUM 9.2 12/06/2023 0812   ALKPHOS 66 12/06/2023 0812   AST 11 12/06/2023 0812   ALT 9 12/06/2023 0812   BILITOT <0.2 12/06/2023 0812     Fasting glucose 98  Lab Results  Component Value Date   WBC 5.6 12/06/2023   HGB 12.1 12/06/2023   HCT 39.4 12/06/2023   MCV 75 (L) 12/06/2023   PLT 255 12/06/2023   Lab Results  Component Value Date   CHOL 168 12/06/2023   HDL 55 12/06/2023   LDLCALC 100 (H) 12/06/2023   TRIG 70 12/06/2023  CHOLHDL 3.1 12/06/2023   Vitamin D -OH 14.9 Negative HIV and RPR NuSwab was negative except for poss BV. Pt asymptomatic.    ASSESSMENT/PLAN:  Right shoulder pain, unspecified chronicity - suspect tendonitis, overuse. No e/o RC tear.  Trial of NSAID. May need PT or sports med for further eval if not resolving - Plan: meloxicam  (MOBIC ) 15 MG tablet  Right foot pain - component of achilles tendonitis; also poss tendonitis of dorsiflexors vs other etiology. May need PT or sports med if not improved with NSAID course - Plan: meloxicam  (MOBIC ) 15 MG tablet  Essential hypertension - Improved control, borderline. Encouraged further wt loss, low Na diet. To get monitor and check BP at home  Vitamin D  deficiency - complete Rx and then start 1000 IU of D3 daily, in addition to MVI  Take meloxicam  once daily with food. This should help treat tendonitis and any inflammatory process in your shoulder and in your foot.  If it bothers your stomach, you can cut the dose in half. Take it every day until your pain has resolved.  You don't have to finish the full 2 week supply, if you are better earlier.  It is fine to use it all if needed.  Do not take any advil /ibuprofen /motrin /aleve /naproxen  or Goody/BC powder while you are taking  the meloxicam . You CAN take tylenol  along with the meloxicam  if needed for additional pain relief.  You may continue to use topical medications as needed (SalonPas with lidocaine , Biofreeze). Voltaren  gel is something that you can use after finishing the anti-inflammatory, if needed.  It is a TOPICAL anti-inflammatory. Probably won't have much benefit while you on the prescription.  If you have ongoing pain after completing the course of meloxicam , next steps can be either physical therapy or referral to a sports medicine doctor or an orthopedist.

## 2023-12-22 ENCOUNTER — Encounter: Payer: Self-pay | Admitting: Family Medicine

## 2023-12-22 ENCOUNTER — Ambulatory Visit: Admitting: Family Medicine

## 2023-12-22 VITALS — BP 130/88 | HR 80 | Ht 63.0 in | Wt 384.0 lb

## 2023-12-22 DIAGNOSIS — I10 Essential (primary) hypertension: Secondary | ICD-10-CM | POA: Diagnosis not present

## 2023-12-22 DIAGNOSIS — M25511 Pain in right shoulder: Secondary | ICD-10-CM | POA: Diagnosis not present

## 2023-12-22 DIAGNOSIS — E559 Vitamin D deficiency, unspecified: Secondary | ICD-10-CM | POA: Diagnosis not present

## 2023-12-22 DIAGNOSIS — M79671 Pain in right foot: Secondary | ICD-10-CM

## 2023-12-22 MED ORDER — MELOXICAM 15 MG PO TABS: 15.0000 mg | ORAL_TABLET | Freq: Every day | ORAL | 0 refills | Status: DC

## 2023-12-22 NOTE — Patient Instructions (Addendum)
 See if you can get your own blood pressure monitor (Omron is the best, ReliOn from [employer] is okay too). If you get the arm monitor, you need the larger size cuff.  Bring your BP monitor (either wrist or arm, whichever you get) to a nurse visit to check it against ours, to verify whether or not the monitor is accurate. If it is found to be accurate, I like to rely on your home readings more than what we see in the office, which may be up due to stress, or pain (depending on what you're coming to the doctor for).  Keep a list of your blood pressure, and bring it to all visits.  Remember to start the vitamin D3 1000 IU once you finish the last of the prescription vitamin D  capsules.  Take meloxicam  once daily with food. This should help treat tendonitis and any inflammatory process in your shoulder and in your foot.  If it bothers your stomach, you can cut the dose in half. Take it every day until your pain has resolved.  You don't have to finish the full 2 week supply, if you are better earlier.  It is fine to use it all if needed.  Do not take any advil /ibuprofen /motrin /aleve /naproxen  or Goody/BC powder while you are taking the meloxicam . You CAN take tylenol  along with the meloxicam  if needed for additional pain relief.  You may continue to use topical medications as needed (SalonPas with lidocaine , Biofreeze). Voltaren  gel is something that you can use after finishing the anti-inflammatory, if needed.  It is a TOPICAL anti-inflammatory. Probably won't have much benefit while you on the prescription.  If you have ongoing pain after completing the course of meloxicam , next steps can be either physical therapy or referral to a sports medicine doctor or an orthopedist.

## 2024-02-02 ENCOUNTER — Other Ambulatory Visit: Payer: Self-pay | Admitting: Family Medicine

## 2024-02-02 DIAGNOSIS — M79671 Pain in right foot: Secondary | ICD-10-CM

## 2024-02-02 DIAGNOSIS — M25511 Pain in right shoulder: Secondary | ICD-10-CM

## 2024-02-02 NOTE — Telephone Encounter (Signed)
 Left message to see if patient requested this or if this was an auto refill.

## 2024-02-06 NOTE — Telephone Encounter (Signed)
 We said at visit PT or sports med referral if not improving. This was for both shoulder and foot pain. It is okay to refill it, but need next step of the plan in place. Please see which she prefers (knowing that sports med might possibly also recommend PT, but may also have other helpful suggestions/treatments (home exercises, possible injections, etc).

## 2024-02-06 NOTE — Telephone Encounter (Signed)
Patient requested this to be refilled.

## 2024-02-06 NOTE — Telephone Encounter (Signed)
 Left message to see what patient's preference is.

## 2024-02-07 NOTE — Telephone Encounter (Signed)
 Pt is agreeable to sports medication referral. I have placed this and refilled medication

## 2024-02-09 ENCOUNTER — Ambulatory Visit
Admission: RE | Admit: 2024-02-09 | Discharge: 2024-02-09 | Disposition: A | Discharge status: Home / Self Care | Source: Ambulatory Visit | Attending: Family Medicine | Admitting: Family Medicine

## 2024-02-09 ENCOUNTER — Other Ambulatory Visit: Payer: Self-pay | Admitting: Family Medicine

## 2024-02-09 ENCOUNTER — Encounter: Payer: Self-pay | Admitting: Family Medicine

## 2024-02-09 ENCOUNTER — Ambulatory Visit: Admitting: Family Medicine

## 2024-02-09 VITALS — BP 163/86 | Ht 63.0 in | Wt 370.0 lb

## 2024-02-09 DIAGNOSIS — G8929 Other chronic pain: Secondary | ICD-10-CM

## 2024-02-09 DIAGNOSIS — I10 Essential (primary) hypertension: Secondary | ICD-10-CM

## 2024-02-09 DIAGNOSIS — M25511 Pain in right shoulder: Secondary | ICD-10-CM

## 2024-02-09 DIAGNOSIS — Z6841 Body Mass Index (BMI) 40.0 and over, adult: Secondary | ICD-10-CM

## 2024-02-09 DIAGNOSIS — M1712 Unilateral primary osteoarthritis, left knee: Secondary | ICD-10-CM

## 2024-02-09 DIAGNOSIS — M25562 Pain in left knee: Secondary | ICD-10-CM

## 2024-02-09 MED ORDER — METHYLPREDNISOLONE ACETATE 40 MG/ML IJ SUSP
40.0000 mg | Freq: Once | INTRAMUSCULAR | Status: AC
  Administered 2024-02-09: 40 mg via INTRA_ARTICULAR

## 2024-02-09 NOTE — Patient Instructions (Signed)

## 2024-02-09 NOTE — Progress Notes (Signed)
 PCP: Roosvelt Colla, MD  Subjective:   HPI: Patient is a 43 y.o. female here for left knee and right shoulder pain.  In terms of her left knee, Kathryne states that the pain has been present for several years. She describes the pain as a tightness that becomes sharp with use. Her pain is worst with weight bearing and has been minimally responsive to tylenol , ibuprofen , biofreeze. Her PCP prescribed her meloxicam  but she has not started this medication yet. She also notes some instability of the joint, with instances where she feels like it slides out of alignment. She works in housekeeping and through the school system, with both jobs requiring lots of time spent on her feet. She has increased adiposity, and notes that she has been around her current weight for 20 years. She also notes her mother had a knee replacement in her 40s-50s. She does not note any history of trauma, no fevers or chills, and no reddening of the skin.   For her right shoulder, she states the pain started around a year ago after lifting some supplies abnormally. The pain is achy in nature and exacerbated by overhead exercises and sleeping on the affected shoulder. She has tried biofreeze with some benefit, but states ibuprofen  and tylenol  have not been beneficial. She does not have pain radiating down into her arm/hand, numbness or tingling in the area, nor pain in her cervical spine. She also does not have any history of trauma to the shoulder.   Past Medical History:  Diagnosis Date   Alpha thalassemia trait    Arthritis of foot, left    (see imaging 02/2014)   COVID-19 09/2019   Hypertension    Morbid obesity with BMI of 70 and over, adult (HCC)    Seasonal allergies    Trichomonal vaginitis 10/2015   Trichomonas vaginitis 05/2021   noted on pap   Vitamin D  deficiency     Current Outpatient Medications on File Prior to Visit  Medication Sig Dispense Refill   amLODipine  (NORVASC ) 10 MG tablet Take 1 tablet (10 mg total)  by mouth daily. 90 tablet 1   cholecalciferol (VITAMIN D ) 1000 UNITS tablet Take 2,000 Units at bedtime by mouth.  (Patient not taking: Reported on 12/22/2023)     fluticasone  (FLONASE ) 50 MCG/ACT nasal spray Place 2 sprays into both nostrils daily. 16 g 11   meloxicam  (MOBIC ) 15 MG tablet TAKE 1 TABLET BY MOUTH EVERY DAY 15 tablet 0   Multiple Vitamins-Minerals (MULTI ADULT GUMMIES PO) Take 2 each by mouth daily.     Vitamin D , Ergocalciferol , (DRISDOL ) 1.25 MG (50000 UNIT) CAPS capsule Take 1 capsule (50,000 Units total) by mouth every 7 (seven) days. 12 capsule 0   No current facility-administered medications on file prior to visit.    Past Surgical History:  Procedure Laterality Date   CESAREAN SECTION N/A 05/14/2016   Procedure: CESAREAN SECTION;  Surgeon: Evalyn Hillier, MD;  Location: [facility] BIRTHING SU[facility];  Service: Obstetrics;  Laterality: N/A;    Allergies  Allergen Reactions   Penicillins Hives and Other (See Comments)    Has patient had a PCN reaction causing immediate rash, facial/tongue/throat swelling, SOB or lightheadedness with hypotension: No Has patient had a PCN reaction causing severe rash involving mucus membranes or skin necrosis: No Has patient had a PCN reaction that required hospitalization No Has patient had a PCN reaction occurring within the last 10 years: No If all of the above answers are NO, then may proceed with  Cephalosporin use.    BP (!) 163/86   Ht 5' 3 (1.6 m)   Wt (!) 370 lb (167.8 kg)   BMI 65.54 kg/m       No data to display              No data to display              Objective:  Physical Exam:  Gen: NAD, comfortable in exam room  MSK: Right Shoulder Inspection: No bruises, swelling, deformities Palpation: Tender to supraspinatus, AC joint. No tenderness to shoulder girdle, scapular spine, infraspinatus, biceps tendon ROM: Flex 170, ABD 170, ER 60 Strength: Flex 5/5, ABD 5/5, ER 5/5, IR 5/5 Special Tests: Neers positive,  Hawkins positive, Empty Can positive, Painful arc positive, Scarf negative, AC Shear negative, O'Brien's negative  Left Knee Inspection: No bruising, swelling Palpation: Tender to medial and lateral joint lines, medial popliteal fossa. No tenderness along patella, quadriceps or patellar tendons.  ROM: Flexion 0-100 Strength: Flexion 5/5, Extension 5/5, Plantarflexion 5/5 Gait:  Special Tests: Varus/Valgus negative, Lachman negative, Anterior Drawer negative, Posterior Drawer negative, McMurray positive, Apley's positive   Assessment & Plan:  Patient is a 43 y.o. female here for left knee and right shoulder pain.   1. Left Knee Pain  Given her pain with weight bearing, increased adiposity, family history of knee replacement at a young age, and physical exam significant for medial and lateral jointline pain concerned she may have osteoarthritis of the knee. Possible there is also a component of degenerative medial meniscal injury given her subjective instability and positive Apley's. Plan for now is to proceed with imaging of the left knee, with consideration for bracing or steroid injections based on the results. In the meantime we will trial the Meloxicam  to see if it helps her pain.  - Xray left knee 4 views - Meloxicam  daily for 2 weeks, ice and heat as needed for pain - Consider bracing, steroid injection pending results of xray - Will follow-up once xray results are obtained  2. Subacromial Impingement Syndrome Her pain with overhead activities and side-sleeping, as well as physical exam with positive Neers, Hawkins, Empty Can, and painful arc all indicate her pain is due to subacromial impingement syndrome. While her AC joint was tender to palpation, low suspicion of pathology in the joint given negative scarf test. After discussing risks and benefits we decided to proceed with a subacromial steroid injection. In addition, we will start rotator cuff physical therapy and meloxicam  as above.   - Subacromial steroid injection  - Referral for rotator cuff PT - Meloxicam  as above for pain  PROCEDURE:  Risks & benefits of right shoulder subacromial injection reviewed. Consent obtained. Time-out completed. Patient prepped and draped in the normal fashion. Area cleansed with alcohol. Ethyl chloride spray used to anesthetize the skin. Solution of 4 mL 1% lidocaine  with 1 mL methylprednisolone  (Depo-medrol ) 40mg /mL injected into the right subacromial space using a 25-gauge 1.5-inch needle via the posterior approach. Patient tolerated procedure well without any complications. Area covered with adhesive bandage. Post-procedure care reviewed. All questions answered.    Elnoria Hails, MS4 [employer] [facility]

## 2024-02-20 ENCOUNTER — Ambulatory Visit: Payer: Self-pay | Admitting: Family Medicine

## 2024-03-22 ENCOUNTER — Ambulatory Visit: Admitting: Family Medicine

## 2024-03-27 ENCOUNTER — Other Ambulatory Visit: Payer: Self-pay | Admitting: Family Medicine

## 2024-03-27 DIAGNOSIS — M25511 Pain in right shoulder: Secondary | ICD-10-CM

## 2024-03-27 DIAGNOSIS — M79671 Pain in right foot: Secondary | ICD-10-CM

## 2024-06-07 ENCOUNTER — Other Ambulatory Visit: Payer: Self-pay | Admitting: Family Medicine

## 2024-06-07 DIAGNOSIS — I10 Essential (primary) hypertension: Secondary | ICD-10-CM

## 2024-06-26 ENCOUNTER — Encounter: Payer: Self-pay | Admitting: *Deleted

## 2024-06-26 NOTE — Progress Notes (Unsigned)
 No chief complaint on file.  Patient presents for 6 month follow-up on chronic problems.  Hypertension follow-up:  She had been off amlodipine  10mg  for over a year. BP was elevated at her physical in April, and it was restarted.  F/u BP was improved. She reports compliance with amlodipine , and denies side effects. BP's are not checked elsewhere.  ***  BP Readings from Last 3 Encounters:  02/09/24 (!) 163/86  12/22/23 130/88  12/05/23 (!) 170/94   She denies headaches (having some sinus headaches recently), dizziness, chest pain, palpitations, shortness of breath, edema.   We had discussed R shoulder pain, R foot pain, and L knee pain at her last visit in May.  She was prescribed meloxicam , and she was referred to sports medicine in June when she requested a refill (due to ongoing pain).  She saw Dr. Teressa in June. X-rays were done on knee, showing arthritis. She was encouraged to use a knee brace, and to lose weight. XR L knee 01/2024: IMPRESSION: Mild to moderate tricompartmental osteoarthritis.  She was felt to have subacromial impingement, and was given a cortisone injection. She was referred for PT fo both her shoulder and her knee,  but this was never scheduled.  ?? ***UPDATE   Vitamin D  deficiency:  Last level was low at 14.9 in April, when taking only a MVI daily, no separate D3 in quite a while. She was treated with 12 weeks of prescription vitamin D , and was advised to start 1000 IU of D3. She is currently taking ***  She is due for recheck today.   Component Ref Range & Units (hover) 6 mo ago (12/06/23) 3 yr ago (05/25/21) 3 yr ago (11/17/20) 7 yr ago (03/28/17) 8 yr ago (10/24/15) 8 yr ago (10/13/15) 10 yr ago (05/13/14)  Vit D, 25-Hydroxy 14.9 Low  25.3 Low  CM 19.2 Low  CM 29 Low  R, CM 15 Low  R, CM 11 Low  R, CM 18 Low  R, CM    Obesity:  She has cut back on carbs, eating more fruits and vegetables.   She mainly drinks water, no longer has any soda.  Has sweet tea  once/week. She cut back on pasta to once a week. Snacks on crackers, healthy chips (low calories), cheese-its.  Sports Medicine docs had briefly discussed GLPs to help with weight loss, which would help her knee pain. Advised to d/w PCP. ***UPDATE   Wt Readings from Last 3 Encounters:  02/09/24 (!) 370 lb (167.8 kg)  12/22/23 (!) 384 lb (174.2 kg)  12/05/23 (!) 389 lb (176.4 kg)     PMH, PSH, SH reviewed   ROS:  L knee pain R shoulder pain    PHYSICAL EXAM:  There were no vitals taken for this visit.  Wt Readings from Last 3 Encounters:  02/09/24 (!) 370 lb (167.8 kg)  12/22/23 (!) 384 lb (174.2 kg)  12/05/23 (!) 389 lb (176.4 kg)       ASSESSMENT/PLAN:   Flu Offer/decline COVID booster  Coverage for GLP? Interested?

## 2024-06-27 ENCOUNTER — Ambulatory Visit: Payer: Self-pay | Admitting: Family Medicine

## 2024-06-27 ENCOUNTER — Encounter: Payer: Self-pay | Admitting: Family Medicine

## 2024-06-27 VITALS — BP 136/84 | HR 84 | Ht 63.0 in | Wt 394.8 lb

## 2024-06-27 DIAGNOSIS — M79671 Pain in right foot: Secondary | ICD-10-CM | POA: Diagnosis not present

## 2024-06-27 DIAGNOSIS — Z23 Encounter for immunization: Secondary | ICD-10-CM

## 2024-06-27 DIAGNOSIS — J302 Other seasonal allergic rhinitis: Secondary | ICD-10-CM

## 2024-06-27 DIAGNOSIS — M1712 Unilateral primary osteoarthritis, left knee: Secondary | ICD-10-CM

## 2024-06-27 DIAGNOSIS — M25511 Pain in right shoulder: Secondary | ICD-10-CM | POA: Diagnosis not present

## 2024-06-27 DIAGNOSIS — E66813 Obesity, class 3: Secondary | ICD-10-CM

## 2024-06-27 DIAGNOSIS — Z6841 Body Mass Index (BMI) 40.0 and over, adult: Secondary | ICD-10-CM

## 2024-06-27 DIAGNOSIS — I10 Essential (primary) hypertension: Secondary | ICD-10-CM

## 2024-06-27 DIAGNOSIS — E559 Vitamin D deficiency, unspecified: Secondary | ICD-10-CM | POA: Diagnosis not present

## 2024-06-27 MED ORDER — MELOXICAM 15 MG PO TABS: 15.0000 mg | ORAL_TABLET | Freq: Every day | ORAL | 0 refills | Status: AC

## 2024-06-27 NOTE — Patient Instructions (Addendum)
 I recommend reaching out (via MyChart message, if able) in order to follow-up on getting your knee brace and physical therapy, both of which were recommended back in June. At this point, you may need to schedule a follow-up visit.  Also to discuss your foot pain with them. I sent in a refill of the meloxicam . If this and the sports med team isn't helping the foot pain, then seeing [facility] is a good idea.   Try and avoid over-the-counter sinus medications--these are usually decongestants which can raise blood pressure (phenylephrine  or pseudoephedrine). Coricidin HBP is safe for people with high blood pressure to take. If you are having sinus headaches, try using sinus rinses (neti-pot or sinus rinse kit, using distilled or boiled water, not tap water).  Using flonase  daily will also help with allergies/sinus issues.  Please try and drink mostly water, and almond milk. Avoid drinking sweet tea or other sweetened beverages (either sweetened with sugars or artificial sweeteners, as these can trigger more sugar cravings). Avoid juices, lemonade.  Eat the fruits and vegetables instead of juices. Try and get at least 5-7 servings of fruits and vegetables daily. Try and eat some beans, lentils.  See attached information about plantar fasciitis. For the exercise, hold the stretches for 10-15 seconds. Repeat 10 times, and do them twice daily.

## 2024-06-28 ENCOUNTER — Ambulatory Visit: Payer: Self-pay | Admitting: Family Medicine

## 2024-06-28 DIAGNOSIS — E559 Vitamin D deficiency, unspecified: Secondary | ICD-10-CM

## 2024-06-28 LAB — VITAMIN D 25 HYDROXY (VIT D DEFICIENCY, FRACTURES): Vit D, 25-Hydroxy: 16.1 ng/mL — ABNORMAL LOW (ref 30.0–100.0)

## 2024-06-28 MED ORDER — VITAMIN D (ERGOCALCIFEROL) 1.25 MG (50000 UNIT) PO CAPS: 50000.0000 [IU] | ORAL_CAPSULE | ORAL | 0 refills | Status: AC

## 2024-12-31 ENCOUNTER — Encounter: Payer: Self-pay | Admitting: Family Medicine
